# Patient Record
Sex: Male | Born: 1945 | Race: White | Hispanic: No | Marital: Married | Smoking: Former smoker
Health system: Southern US, Community
[De-identification: ages and names within clinical notes are randomized; demographics above are authoritative.]

## PROBLEM LIST (undated history)

## (undated) DIAGNOSIS — I493 Ventricular premature depolarization: Secondary | ICD-10-CM

## (undated) DIAGNOSIS — E7800 Pure hypercholesterolemia, unspecified: Secondary | ICD-10-CM

## (undated) DIAGNOSIS — I441 Atrioventricular block, second degree: Secondary | ICD-10-CM

## (undated) DIAGNOSIS — G1221 Amyotrophic lateral sclerosis: Secondary | ICD-10-CM

## (undated) DIAGNOSIS — E669 Obesity, unspecified: Secondary | ICD-10-CM

## (undated) DIAGNOSIS — R9439 Abnormal result of other cardiovascular function study: Secondary | ICD-10-CM

## (undated) DIAGNOSIS — M1990 Unspecified osteoarthritis, unspecified site: Secondary | ICD-10-CM

## (undated) DIAGNOSIS — G4733 Obstructive sleep apnea (adult) (pediatric): Secondary | ICD-10-CM

## (undated) DIAGNOSIS — H3530 Unspecified macular degeneration: Secondary | ICD-10-CM

## (undated) DIAGNOSIS — R269 Unspecified abnormalities of gait and mobility: Secondary | ICD-10-CM

## (undated) DIAGNOSIS — I2510 Atherosclerotic heart disease of native coronary artery without angina pectoris: Secondary | ICD-10-CM

## (undated) HISTORY — PX: INJECTION KNEE: SHX2446

## (undated) HISTORY — DX: Ventricular premature depolarization: I49.3

## (undated) HISTORY — PX: COLONOSCOPY: SHX174

## (undated) HISTORY — DX: Unspecified macular degeneration: H35.30

## (undated) HISTORY — DX: Pure hypercholesterolemia, unspecified: E78.00

## (undated) HISTORY — DX: Unspecified osteoarthritis, unspecified site: M19.90

## (undated) HISTORY — DX: Atrioventricular block, second degree: I44.1

## (undated) HISTORY — DX: Obstructive sleep apnea (adult) (pediatric): G47.33

## (undated) HISTORY — DX: Atherosclerotic heart disease of native coronary artery without angina pectoris: I25.10

## (undated) HISTORY — PX: HEMORRHOIDECTOMY WITH HEMORRHOID BANDING: SHX5633

## (undated) HISTORY — DX: Obesity, unspecified: E66.9

## (undated) HISTORY — DX: Abnormal result of other cardiovascular function study: R94.39

---
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
## 2006-01-26 ENCOUNTER — Encounter: Admission: RE | Admit: 2006-01-26 | Discharge: 2006-01-26 | Payer: Self-pay | Admitting: Orthopedic Surgery

## 2008-04-01 ENCOUNTER — Ambulatory Visit: Payer: Self-pay | Admitting: Unknown Physician Specialty

## 2008-05-18 ENCOUNTER — Ambulatory Visit: Payer: Self-pay | Admitting: Family Medicine

## 2009-09-05 ENCOUNTER — Ambulatory Visit: Payer: Self-pay | Admitting: Family Medicine

## 2009-11-08 ENCOUNTER — Ambulatory Visit: Payer: Self-pay | Admitting: Family Medicine

## 2011-02-26 DIAGNOSIS — I2510 Atherosclerotic heart disease of native coronary artery without angina pectoris: Secondary | ICD-10-CM

## 2011-02-26 DIAGNOSIS — R9439 Abnormal result of other cardiovascular function study: Secondary | ICD-10-CM

## 2011-02-26 HISTORY — DX: Atherosclerotic heart disease of native coronary artery without angina pectoris: I25.10

## 2011-02-26 HISTORY — DX: Abnormal result of other cardiovascular function study: R94.39

## 2011-02-26 HISTORY — PX: CORONARY ARTERY BYPASS GRAFT: SHX141

## 2011-03-05 ENCOUNTER — Encounter: Payer: Self-pay | Admitting: Cardiology

## 2011-03-05 ENCOUNTER — Ambulatory Visit (INDEPENDENT_AMBULATORY_CARE_PROVIDER_SITE_OTHER): Payer: Medicare Other | Admitting: Cardiology

## 2011-03-05 VITALS — BP 148/82 | HR 80 | Ht 73.0 in | Wt 225.0 lb

## 2011-03-05 DIAGNOSIS — R9431 Abnormal electrocardiogram [ECG] [EKG]: Secondary | ICD-10-CM

## 2011-03-05 DIAGNOSIS — I493 Ventricular premature depolarization: Secondary | ICD-10-CM

## 2011-03-05 DIAGNOSIS — I4949 Other premature depolarization: Secondary | ICD-10-CM

## 2011-03-05 DIAGNOSIS — R5383 Other fatigue: Secondary | ICD-10-CM | POA: Insufficient documentation

## 2011-03-05 DIAGNOSIS — E785 Hyperlipidemia, unspecified: Secondary | ICD-10-CM

## 2011-03-05 DIAGNOSIS — E7800 Pure hypercholesterolemia, unspecified: Secondary | ICD-10-CM

## 2011-03-05 DIAGNOSIS — R5381 Other malaise: Secondary | ICD-10-CM

## 2011-03-05 NOTE — Patient Instructions (Signed)
We will schedule you for a stress Echo   If your stress test is normal then I would focus on modifying your risk factors.  Eat a heart healthy diet ( Mediterranean)   Keep your weight down.   Exercise regularly.  Target cholesterol goal would be an LDL less than 100.

## 2011-03-05 NOTE — Progress Notes (Signed)
Jason Tate Date of Birth: 10/01/1945   History of Present Illness: [name] is seen today at the request of Dr. Sullivan Tate for evaluation of cardiac risk. He has a history of benign PVCs. He had a remote stress test 12 years ago that was normal. He does have sleep apnea and is on CPAP therapy. He denies any symptoms of chest pain or shortness of breath but does complain of fatigue. Chest x-ray apparently demonstrated atherosclerotic disease of the aorta. He is concerned about his cardiovascular risk and wanted to be evaluated. He denies a history of hypertension. His cholesterol levels have been elevated but the results are unknown. He has no history of tobacco use or diabetes. Current Outpatient Prescriptions on File Prior to Visit  Medication Sig Dispense Refill  . allopurinol (ZYLOPRIM) 300 MG tablet Take 300 mg by mouth daily.        Marland Kitchen aspirin 81 MG tablet Take 81 mg by mouth 2 (two) times daily.        Marland Kitchen buPROPion (WELLBUTRIN XL) 300 MG 24 hr tablet Take 300 mg by mouth daily.        . citalopram (CELEXA) 40 MG tablet Take 40 mg by mouth daily.        . nabumetone (RELAFEN) 750 MG tablet Take 750 mg by mouth 2 (two) times daily.        . Omega-3 Fatty Acids (FISH OIL PO) Take by mouth.        Marland Kitchen omeprazole (PRILOSEC) 20 MG capsule Take 20 mg by mouth daily.          No Known Allergies  Past Medical History  Diagnosis Date  . Sleep apnea, obstructive     on CPAP  . Hypercholesterolemia   . Low testosterone   . PVC (premature ventricular contraction)   . Macular degeneration   . Obesity     History reviewed. No pertinent past surgical history.  History  Smoking status  . Former Smoker  Smokeless tobacco  . Not on file    History  Alcohol Use  . Yes    Family History  Problem Relation Age of Onset  . Heart disease Mother   . Stroke Father   . Heart disease Father     Review of Systems: The review of systems is positive for low testosterone.  He is on  AndroGel therapy. He uses CPAP therapy. He exercises at the [facility] 3 days a week. All other systems were reviewed and are negative.  Physical Exam: BP 148/82  Pulse 80  Ht 6\' 1"  (1.854 m)  Wt 225 lb (102.059 kg)  BMI 29.69 kg/m2 He is an overweight white male in no acute distress.The patient is alert and oriented x 3.  The mood and affect are normal.  The skin is warm and dry.  Color is normal.  The HEENT exam reveals that the sclera are nonicteric.  The mucous membranes are moist.  The carotids are 2+ without bruits.  There is no thyromegaly.  There is no JVD.  The lungs are clear.  The chest wall is non tender.  The heart exam reveals a regular rate with a normal S1 and S2.  There are no murmurs, gallops, or rubs.  The PMI is not displaced.   Abdominal exam reveals good bowel sounds.  There is no guarding or rebound.  There is no hepatosplenomegaly or tenderness.  There are no masses.  Exam of the legs reveal no clubbing, cyanosis, or  edema.  The legs are without rashes.  The distal pulses are intact.  Cranial nerves II - XII are intact.  Motor and sensory functions are intact.  The gait is normal.   LABORATORY DATA: ECG today demonstrates normal sinus rhythm with QS complex in leads V1 through V3. Cannot rule out anterior infarct.  Assessment / Plan:

## 2011-03-05 NOTE — Assessment & Plan Note (Signed)
Cannot rule out prior septal infarct. We will schedule him for a stress echo to further evaluate. If his stress test is normal then I would recommend continued risk factor modification.

## 2011-03-05 NOTE — Assessment & Plan Note (Signed)
His lipid levels are unknown. I've recommended a target LDL of 100. He is going to work on his risk factor modification with dietary and exercise changes. If he is not able to achieve this goal statin therapy would be a consideration.

## 2011-03-19 ENCOUNTER — Ambulatory Visit (HOSPITAL_COMMUNITY): Payer: Medicare Other | Attending: Cardiology | Admitting: Radiology

## 2011-03-19 ENCOUNTER — Ambulatory Visit (HOSPITAL_BASED_OUTPATIENT_CLINIC_OR_DEPARTMENT_OTHER): Payer: Medicare Other | Admitting: Radiology

## 2011-03-19 DIAGNOSIS — E785 Hyperlipidemia, unspecified: Secondary | ICD-10-CM | POA: Insufficient documentation

## 2011-03-19 DIAGNOSIS — I4949 Other premature depolarization: Secondary | ICD-10-CM | POA: Insufficient documentation

## 2011-03-19 DIAGNOSIS — R9431 Abnormal electrocardiogram [ECG] [EKG]: Secondary | ICD-10-CM

## 2011-03-19 DIAGNOSIS — R5383 Other fatigue: Secondary | ICD-10-CM

## 2011-03-19 DIAGNOSIS — R0989 Other specified symptoms and signs involving the circulatory and respiratory systems: Secondary | ICD-10-CM

## 2011-03-19 DIAGNOSIS — R5381 Other malaise: Secondary | ICD-10-CM | POA: Insufficient documentation

## 2011-03-20 ENCOUNTER — Ambulatory Visit
Admission: RE | Admit: 2011-03-20 | Discharge: 2011-03-20 | Disposition: A | Discharge status: Home / Self Care | Payer: Medicare Other | Source: Ambulatory Visit | Attending: Nurse Practitioner | Admitting: Nurse Practitioner

## 2011-03-20 ENCOUNTER — Telehealth: Payer: Self-pay | Admitting: *Deleted

## 2011-03-20 ENCOUNTER — Ambulatory Visit (INDEPENDENT_AMBULATORY_CARE_PROVIDER_SITE_OTHER): Payer: Medicare Other | Admitting: Nurse Practitioner

## 2011-03-20 ENCOUNTER — Encounter: Payer: Self-pay | Admitting: Nurse Practitioner

## 2011-03-20 VITALS — BP 170/110 | HR 84 | Ht 73.0 in | Wt 222.1 lb

## 2011-03-20 DIAGNOSIS — R0789 Other chest pain: Secondary | ICD-10-CM

## 2011-03-20 DIAGNOSIS — R079 Chest pain, unspecified: Secondary | ICD-10-CM

## 2011-03-20 DIAGNOSIS — R9439 Abnormal result of other cardiovascular function study: Secondary | ICD-10-CM

## 2011-03-20 LAB — CBC WITH DIFFERENTIAL/PLATELET
Basophils Absolute: 0 10*3/uL (ref 0.0–0.1)
Basophils Relative: 0.5 % (ref 0.0–3.0)
Eosinophils Absolute: 0.2 10*3/uL (ref 0.0–0.7)
Eosinophils Relative: 3.3 % (ref 0.0–5.0)
HCT: 37.8 % — ABNORMAL LOW (ref 39.0–52.0)
Hemoglobin: 13.3 g/dL (ref 13.0–17.0)
Lymphocytes Relative: 23.2 % (ref 12.0–46.0)
Lymphs Abs: 1.4 10*3/uL (ref 0.7–4.0)
MCHC: 35.1 g/dL (ref 30.0–36.0)
MCV: 100.9 fl — ABNORMAL HIGH (ref 78.0–100.0)
Monocytes Absolute: 0.5 10*3/uL (ref 0.1–1.0)
Monocytes Relative: 8.2 % (ref 3.0–12.0)
Neutro Abs: 4 10*3/uL (ref 1.4–7.7)
Neutrophils Relative %: 64.8 % (ref 43.0–77.0)
Platelets: 184 10*3/uL (ref 150.0–400.0)
RBC: 3.75 Mil/uL — ABNORMAL LOW (ref 4.22–5.81)
RDW: 13.7 % (ref 11.5–14.6)
WBC: 6.2 10*3/uL (ref 4.5–10.5)

## 2011-03-20 LAB — BASIC METABOLIC PANEL
BUN: 17 mg/dL (ref 6–23)
CO2: 24 mEq/L (ref 19–32)
Calcium: 9.2 mg/dL (ref 8.4–10.5)
Chloride: 105 mEq/L (ref 96–112)
Creatinine, Ser: 1.2 mg/dL (ref 0.4–1.5)
GFR: 63.6 mL/min (ref >60.00)
Glucose, Bld: 113 mg/dL — ABNORMAL HIGH (ref 70–99)
Potassium: 3.9 mEq/L (ref 3.5–5.1)
Sodium: 140 mEq/L (ref 135–145)

## 2011-03-20 LAB — HEPATIC FUNCTION PANEL
ALT: 26 U/L (ref 0–53)
AST: 25 U/L (ref 0–37)
Albumin: 4.4 g/dL (ref 3.5–5.2)
Alkaline Phosphatase: 67 U/L (ref 39–117)
Bilirubin, Direct: 0 mg/dL (ref 0.0–0.3)
Total Bilirubin: 0.8 mg/dL (ref 0.3–1.2)
Total Protein: 7.4 g/dL (ref 6.0–8.3)

## 2011-03-20 LAB — LIPID PANEL
Cholesterol: 185 mg/dL (ref 0–200)
HDL: 51.5 mg/dL (ref >39.00)
LDL Cholesterol: 96 mg/dL (ref 0–99)
Total CHOL/HDL Ratio: 4
Triglycerides: 189 mg/dL — ABNORMAL HIGH (ref 0.0–149.0)
VLDL: 37.8 mg/dL (ref 0.0–40.0)

## 2011-03-20 LAB — APTT: aPTT: 26.5 s (ref 21.7–28.8)

## 2011-03-20 LAB — PROTIME-INR
INR: 1 ratio (ref 0.8–1.0)
Prothrombin Time: 10.9 s (ref 10.2–12.4)

## 2011-03-20 NOTE — Telephone Encounter (Signed)
Notified of abn stress echo. Will come in today to see Lawson Fiscal to discuss heart cath; Dr. Swaziland will also talk to him

## 2011-03-20 NOTE — Assessment & Plan Note (Signed)
We will proceed on with cardiac catheterization. The risks, benefits and procedure was discussed and he is willing to proceed. Will plan for cath on Thursday. CXR today. I will see him back for his post hospital in 2 to 3 weeks. He is to limit his activities for now. No change in his medicines. Will need to reassess blood pressure but he says it is ok at home. He is anxious today. Patient is agreeable to this plan and will call if any problems develop in the interim.

## 2011-03-20 NOTE — Telephone Encounter (Signed)
Message copied by Lorayne Bender on Tue Mar 20, 2011  9:04 AM ------      Message from: Swaziland, PETER M      Created: Mon Mar 19, 2011  9:31 PM       Stress Echo is significantly abnormal. We need to set him up for a cardiac cath. I or Lawson Fiscal will need to see to discuss.      Theron Arista Swaziland

## 2011-03-20 NOTE — Progress Notes (Signed)
Jason Tate Date of Birth: Jun 10, 1945 Medical Record #[id number]  History of Present Illness: Jason Tate is seen today for Dr. Swaziland. He has had a stress echo yesterday for risk stratification. He has multiple CV risk factors. No chest pain. His study was abnormal with a baseline EF of 50 to 55% the at 35 to 40% with peak exercise. There was evidence of infarct in the distal inferior wall; ischemia in the posterior, distal and anterior and apical walls as well. He does not have chest pain. He has had fatigue and no energy. He is not short of breath.   Current Outpatient Prescriptions on File Prior to Visit  Medication Sig Dispense Refill  . allopurinol (ZYLOPRIM) 300 MG tablet Take 300 mg by mouth daily.        Marland Kitchen aspirin 81 MG tablet Take 162 mg by mouth 2 (two) times daily.       . B Complex Vitamins (VITAMIN B COMPLEX PO) Take by mouth.        Marland Kitchen buPROPion (WELLBUTRIN XL) 300 MG 24 hr tablet Take 300 mg by mouth daily.        . Cholecalciferol (VITAMIN D PO) Take 4,000 mg by mouth daily.        . citalopram (CELEXA) 40 MG tablet Take 40 mg by mouth daily.        . Misc Natural Products (OSTEO BI-FLEX JOINT SHIELD PO) Take 1,500 mg by mouth 2 (two) times daily.        . Multiple Vitamins-Minerals (PRESERVISION/LUTEIN PO) Take by mouth 3 (three) times daily.        . nabumetone (RELAFEN) 750 MG tablet Take 750 mg by mouth 2 (two) times daily.        . Omega-3 Fatty Acids (FISH OIL PO) Take by mouth.        Marland Kitchen omeprazole (PRILOSEC) 20 MG capsule Take 20 mg by mouth daily.        . Testosterone (ANDROGEL PUMP TD) Place onto the skin daily.          No Known Allergies  Past Medical History  Diagnosis Date  . Sleep apnea, obstructive     on CPAP  . Hypercholesterolemia   . Low testosterone   . PVC (premature ventricular contraction)   . Macular degeneration   . Obesity   . Abnormal stress echo Oct 2012    History reviewed. No pertinent past surgical history.  History    Smoking status  . Former Smoker  Smokeless tobacco  . Not on file    History  Alcohol Use  . Yes    Family History  Problem Relation Age of Onset  . Heart disease Mother   . Stroke Father   . Heart disease Father     Review of Systems: The review of systems is per the HPI.  Blood pressure at home is usually ok. All other systems were reviewed and are negative.  Physical Exam: BP 170/110  Pulse 84  Ht 6\' 1"  (1.854 m)  Wt 222 lb 1.9 oz (100.753 kg)  BMI 29.31 kg/m2 Patient is very pleasant and in no acute distress. He is anxious today.  Skin is warm and dry. Color is normal.  HEENT is unremarkable. Normocephalic/atraumatic. PERRL. Sclera are nonicteric. Neck is supple. No masses. No JVD. Lungs are clear. Cardiac exam shows a regular rate and rhythm. Abdomen is soft. Extremities are without edema. Gait and ROM are intact. No gross neurologic deficits noted.  LABORATORY DATA:   Assessment / Plan:

## 2011-03-20 NOTE — Patient Instructions (Signed)
We are going to arrange for a heart catheterization with Dr. Swaziland.  Please go to [facility] for a CXR today. Go to [facility]   We will get your labs today.  You are scheduled for a cardiac catheterization on Thursday, October 25th at 2:30pm with Dr. Swaziland.  Go to [facility] 2nd Floor Short Stay on Thursday  at 12:30pm. No food or drink after 8am on Thursday. May have clear liquids before 8am. No solids. You may take your medications with a sip of water on the day of your procedure.

## 2011-03-21 ENCOUNTER — Telehealth: Payer: Self-pay | Admitting: *Deleted

## 2011-03-21 NOTE — Telephone Encounter (Signed)
Notified of lab results and cxr results. Ok for cath tomorrow

## 2011-03-21 NOTE — Telephone Encounter (Signed)
Message copied by Lorayne Bender on Wed Mar 21, 2011 11:13 AM ------      Message from: Rosalio Macadamia      Created: Tue Mar 20, 2011  4:39 PM       Ok to report. Labs are satisfactory for cath on Thursday.

## 2011-03-22 ENCOUNTER — Inpatient Hospital Stay (HOSPITAL_COMMUNITY)
Admission: RE | Admit: 2011-03-22 | Discharge: 2011-03-27 | DRG: 234 | Disposition: A | Discharge status: Home / Self Care | Payer: Medicare Other | Source: Ambulatory Visit | Attending: Thoracic Surgery (Cardiothoracic Vascular Surgery) | Admitting: Thoracic Surgery (Cardiothoracic Vascular Surgery)

## 2011-03-22 ENCOUNTER — Ambulatory Visit (HOSPITAL_COMMUNITY): Payer: Medicare Other

## 2011-03-22 DIAGNOSIS — Z87891 Personal history of nicotine dependence: Secondary | ICD-10-CM

## 2011-03-22 DIAGNOSIS — I2510 Atherosclerotic heart disease of native coronary artery without angina pectoris: Principal | ICD-10-CM | POA: Diagnosis present

## 2011-03-22 DIAGNOSIS — Z01810 Encounter for preprocedural cardiovascular examination: Secondary | ICD-10-CM

## 2011-03-22 DIAGNOSIS — H3530 Unspecified macular degeneration: Secondary | ICD-10-CM | POA: Diagnosis present

## 2011-03-22 DIAGNOSIS — G4733 Obstructive sleep apnea (adult) (pediatric): Secondary | ICD-10-CM | POA: Diagnosis present

## 2011-03-22 DIAGNOSIS — M109 Gout, unspecified: Secondary | ICD-10-CM | POA: Diagnosis present

## 2011-03-22 DIAGNOSIS — D62 Acute posthemorrhagic anemia: Secondary | ICD-10-CM | POA: Diagnosis not present

## 2011-03-22 DIAGNOSIS — E669 Obesity, unspecified: Secondary | ICD-10-CM | POA: Diagnosis present

## 2011-03-22 DIAGNOSIS — Z7982 Long term (current) use of aspirin: Secondary | ICD-10-CM

## 2011-03-22 LAB — URINALYSIS, DIPSTICK ONLY
Bilirubin Urine: NEGATIVE
Glucose, UA: NEGATIVE mg/dL
Hgb urine dipstick: NEGATIVE
Ketones, ur: NEGATIVE mg/dL
Leukocytes, UA: NEGATIVE
Nitrite: NEGATIVE
Protein, ur: NEGATIVE mg/dL
Specific Gravity, Urine: 1.031 — ABNORMAL HIGH (ref 1.005–1.030)
Urobilinogen, UA: 0.2 mg/dL (ref 0.0–1.0)
pH: 5.5 (ref 5.0–8.0)

## 2011-03-22 LAB — ABO/RH: ABO/RH(D): B NEG

## 2011-03-22 LAB — PULMONARY FUNCTION TEST

## 2011-03-22 LAB — TYPE AND SCREEN
ABO/RH(D): B NEG
Antibody Screen: NEGATIVE

## 2011-03-22 LAB — SURGICAL PCR SCREEN
MRSA, PCR: NEGATIVE
Staphylococcus aureus: NEGATIVE

## 2011-03-23 ENCOUNTER — Inpatient Hospital Stay (HOSPITAL_COMMUNITY): Payer: Medicare Other

## 2011-03-23 DIAGNOSIS — I2510 Atherosclerotic heart disease of native coronary artery without angina pectoris: Secondary | ICD-10-CM

## 2011-03-23 LAB — PROTIME-INR
INR: 1.26 (ref 0.00–1.49)
Prothrombin Time: 16.1 seconds — ABNORMAL HIGH (ref 11.6–15.2)

## 2011-03-23 LAB — CBC
HCT: 34.1 % — ABNORMAL LOW (ref 39.0–52.0)
HCT: 26.7 % — ABNORMAL LOW (ref 39.0–52.0)
HCT: 20.3 % — ABNORMAL LOW (ref 39.0–52.0)
Hemoglobin: 11.8 g/dL — ABNORMAL LOW (ref 13.0–17.0)
Hemoglobin: 9.5 g/dL — ABNORMAL LOW (ref 13.0–17.0)
Hemoglobin: 7.2 g/dL — ABNORMAL LOW (ref 13.0–17.0)
MCH: 34 pg (ref 26.0–34.0)
MCH: 34.4 pg — ABNORMAL HIGH (ref 26.0–34.0)
MCH: 34.8 pg — ABNORMAL HIGH (ref 26.0–34.0)
MCHC: 34.6 g/dL (ref 30.0–36.0)
MCHC: 35.6 g/dL (ref 30.0–36.0)
MCHC: 35.5 g/dL (ref 30.0–36.0)
MCV: 98.3 fL (ref 78.0–100.0)
MCV: 96.7 fL (ref 78.0–100.0)
MCV: 98.1 fL (ref 78.0–100.0)
Platelets: 166 10*3/uL (ref 150–400)
Platelets: 90 10*3/uL — ABNORMAL LOW (ref 150–400)
Platelets: 118 10*3/uL — ABNORMAL LOW (ref 150–400)
RBC: 3.47 MIL/uL — ABNORMAL LOW (ref 4.22–5.81)
RBC: 2.76 MIL/uL — ABNORMAL LOW (ref 4.22–5.81)
RBC: 2.07 MIL/uL — ABNORMAL LOW (ref 4.22–5.81)
RDW: 13.8 % (ref 11.5–15.5)
RDW: 13.6 % (ref 11.5–15.5)
RDW: 13.4 % (ref 11.5–15.5)
WBC: 7.7 10*3/uL (ref 4.0–10.5)
WBC: 8.9 10*3/uL (ref 4.0–10.5)
WBC: 8.8 10*3/uL (ref 4.0–10.5)

## 2011-03-23 LAB — POCT I-STAT, CHEM 8
BUN: 10 mg/dL (ref 6–23)
Calcium, Ion: 0.95 mmol/L — ABNORMAL LOW (ref 1.12–1.32)
Chloride: 112 mEq/L (ref 96–112)
Creatinine, Ser: 0.7 mg/dL (ref 0.50–1.35)
Glucose, Bld: 77 mg/dL (ref 70–99)
HCT: 17 % — ABNORMAL LOW (ref 39.0–52.0)
Hemoglobin: 5.8 g/dL — CL (ref 13.0–17.0)
Potassium: 3 mEq/L — ABNORMAL LOW (ref 3.5–5.1)
Sodium: 145 mEq/L (ref 135–145)
TCO2: 16 mmol/L (ref 0–100)

## 2011-03-23 LAB — BASIC METABOLIC PANEL
BUN: 21 mg/dL (ref 6–23)
CO2: 23 mEq/L (ref 19–32)
Calcium: 9.2 mg/dL (ref 8.4–10.5)
Chloride: 103 mEq/L (ref 96–112)
Creatinine, Ser: 1.04 mg/dL (ref 0.50–1.35)
GFR calc Af Amer: 84 mL/min — ABNORMAL LOW (ref >90)
GFR calc non Af Amer: 73 mL/min — ABNORMAL LOW (ref >90)
Glucose, Bld: 94 mg/dL (ref 70–99)
Potassium: 3.6 mEq/L (ref 3.5–5.1)
Sodium: 138 mEq/L (ref 135–145)

## 2011-03-23 LAB — BLOOD GAS, ARTERIAL
Acid-base deficit: 1.8 mmol/L (ref 0.0–2.0)
Bicarbonate: 22.4 mEq/L (ref 20.0–24.0)
Drawn by: 331761
FIO2: 0.21 %
O2 Saturation: 95.6 %
Patient temperature: 98.6
TCO2: 23.5 mmol/L (ref 0–100)
pCO2 arterial: 37.7 mmHg (ref 35.0–45.0)
pH, Arterial: 7.392 (ref 7.350–7.450)
pO2, Arterial: 76.5 mmHg — ABNORMAL LOW (ref 80.0–100.0)

## 2011-03-23 LAB — HEMOGLOBIN A1C
Hgb A1c MFr Bld: 5.2 % (ref <5.7)
Mean Plasma Glucose: 103 mg/dL (ref <117)

## 2011-03-23 LAB — PLATELET COUNT: Platelets: 122 10*3/uL — ABNORMAL LOW (ref 150–400)

## 2011-03-23 LAB — POCT I-STAT 3, ART BLOOD GAS (G3+)
Acid-base deficit: 2 mmol/L (ref 0.0–2.0)
Acid-base deficit: 1 mmol/L (ref 0.0–2.0)
Bicarbonate: 27.1 mEq/L — ABNORMAL HIGH (ref 20.0–24.0)
Bicarbonate: 25.6 mEq/L — ABNORMAL HIGH (ref 20.0–24.0)
Bicarbonate: 23 mEq/L (ref 20.0–24.0)
Bicarbonate: 22.3 mEq/L (ref 20.0–24.0)
O2 Saturation: 100 %
O2 Saturation: 100 %
O2 Saturation: 95 %
O2 Saturation: 99 %
Patient temperature: 34.6
Patient temperature: 36.6
TCO2: 29 mmol/L (ref 0–100)
TCO2: 27 mmol/L (ref 0–100)
TCO2: 24 mmol/L (ref 0–100)
TCO2: 23 mmol/L (ref 0–100)
pCO2 arterial: 53.9 mmHg — ABNORMAL HIGH (ref 35.0–45.0)
pCO2 arterial: 45.7 mmHg — ABNORMAL HIGH (ref 35.0–45.0)
pCO2 arterial: 35.9 mmHg (ref 35.0–45.0)
pCO2 arterial: 30.7 mmHg — ABNORMAL LOW (ref 35.0–45.0)
pH, Arterial: 7.309 — ABNORMAL LOW (ref 7.350–7.450)
pH, Arterial: 7.355 (ref 7.350–7.450)
pH, Arterial: 7.404 (ref 7.350–7.450)
pH, Arterial: 7.468 — ABNORMAL HIGH (ref 7.350–7.450)
pO2, Arterial: 432 mmHg — ABNORMAL HIGH (ref 80.0–100.0)
pO2, Arterial: 344 mmHg — ABNORMAL HIGH (ref 80.0–100.0)
pO2, Arterial: 67 mmHg — ABNORMAL LOW (ref 80.0–100.0)
pO2, Arterial: 122 mmHg — ABNORMAL HIGH (ref 80.0–100.0)

## 2011-03-23 LAB — POCT I-STAT 4, (NA,K, GLUC, HGB,HCT)
Glucose, Bld: 103 mg/dL — ABNORMAL HIGH (ref 70–99)
Glucose, Bld: 105 mg/dL — ABNORMAL HIGH (ref 70–99)
Glucose, Bld: 91 mg/dL (ref 70–99)
Glucose, Bld: 98 mg/dL (ref 70–99)
Glucose, Bld: 127 mg/dL — ABNORMAL HIGH (ref 70–99)
Glucose, Bld: 121 mg/dL — ABNORMAL HIGH (ref 70–99)
HCT: 30 % — ABNORMAL LOW (ref 39.0–52.0)
HCT: 28 % — ABNORMAL LOW (ref 39.0–52.0)
HCT: 25 % — ABNORMAL LOW (ref 39.0–52.0)
HCT: 25 % — ABNORMAL LOW (ref 39.0–52.0)
HCT: 24 % — ABNORMAL LOW (ref 39.0–52.0)
HCT: 28 % — ABNORMAL LOW (ref 39.0–52.0)
Hemoglobin: 10.2 g/dL — ABNORMAL LOW (ref 13.0–17.0)
Hemoglobin: 9.5 g/dL — ABNORMAL LOW (ref 13.0–17.0)
Hemoglobin: 8.5 g/dL — ABNORMAL LOW (ref 13.0–17.0)
Hemoglobin: 8.5 g/dL — ABNORMAL LOW (ref 13.0–17.0)
Hemoglobin: 8.2 g/dL — ABNORMAL LOW (ref 13.0–17.0)
Hemoglobin: 9.5 g/dL — ABNORMAL LOW (ref 13.0–17.0)
Potassium: 3.8 mEq/L (ref 3.5–5.1)
Potassium: 4 mEq/L (ref 3.5–5.1)
Potassium: 4.3 mEq/L (ref 3.5–5.1)
Potassium: 5 mEq/L (ref 3.5–5.1)
Potassium: 4.3 mEq/L (ref 3.5–5.1)
Potassium: 4.2 mEq/L (ref 3.5–5.1)
Sodium: 139 mEq/L (ref 135–145)
Sodium: 138 mEq/L (ref 135–145)
Sodium: 135 mEq/L (ref 135–145)
Sodium: 136 mEq/L (ref 135–145)
Sodium: 136 mEq/L (ref 135–145)
Sodium: 135 mEq/L (ref 135–145)

## 2011-03-23 LAB — GLUCOSE, CAPILLARY
Glucose-Capillary: 129 mg/dL — ABNORMAL HIGH (ref 70–99)
Glucose-Capillary: 144 mg/dL — ABNORMAL HIGH (ref 70–99)
Glucose-Capillary: 105 mg/dL — ABNORMAL HIGH (ref 70–99)
Glucose-Capillary: 111 mg/dL — ABNORMAL HIGH (ref 70–99)
Glucose-Capillary: 124 mg/dL — ABNORMAL HIGH (ref 70–99)
Glucose-Capillary: 125 mg/dL — ABNORMAL HIGH (ref 70–99)
Glucose-Capillary: 118 mg/dL — ABNORMAL HIGH (ref 70–99)
Glucose-Capillary: 97 mg/dL (ref 70–99)
Glucose-Capillary: 98 mg/dL (ref 70–99)

## 2011-03-23 LAB — HEPARIN LEVEL (UNFRACTIONATED)
Heparin Unfractionated: 0.16 IU/mL — ABNORMAL LOW (ref 0.30–0.70)
Heparin Unfractionated: <0.1 IU/mL — ABNORMAL LOW (ref 0.30–0.70)

## 2011-03-23 LAB — MAGNESIUM: Magnesium: 1.9 mg/dL (ref 1.5–2.5)

## 2011-03-23 LAB — CREATININE, SERUM
Creatinine, Ser: 0.63 mg/dL (ref 0.50–1.35)
GFR calc Af Amer: >90 mL/min (ref >90)
GFR calc non Af Amer: >90 mL/min (ref >90)

## 2011-03-23 LAB — HEMOGLOBIN AND HEMATOCRIT, BLOOD
HCT: 24.9 % — ABNORMAL LOW (ref 39.0–52.0)
Hemoglobin: 8.7 g/dL — ABNORMAL LOW (ref 13.0–17.0)

## 2011-03-23 LAB — APTT: aPTT: 35 seconds (ref 24–37)

## 2011-03-23 NOTE — Consult Note (Signed)
NAMEAUSTINE, Jason Tate NO.:  [id number]  MEDICAL RECORD NO.:  [id number]  LOCATION:  2502                         FACILITY:  [facility]  PHYSICIAN:  Salvatore Decent. Cornelius Moras, M.D. DATE OF BIRTH:  March 11, 1946  DATE OF CONSULTATION:  03/22/2011 DATE OF DISCHARGE:                                CONSULTATION   REQUESTING PHYSICIAN:  Peter M. Swaziland, MD  PRIMARY CARE PHYSICIAN:  Julieanne Manson, MD  REASON FOR CONSULTATION:  Left main disease.  HISTORY OF PRESENT ILLNESS:  The patient is a 65 year old gentleman from [place] with no previous history of coronary artery disease and essentially no risk factors other than the fact that the patient's father did have coronary artery disease.  The patient reports that he has had some decreased energy for several months and perhaps a year or so.  He also has occasional episodes of shortness of breath with strenuous physical activity.  He was referred for a stress echocardiogram which was abnormal at baseline with ejection fraction of 50-55%.  With peak exercise, ejection fraction dropped to 67-40%.  The patient reports that his symptoms of shortness of breath were reproduced during stress during the procedure.  The patient was subsequently brought in for elective cardiac catheterization today.  The patient was found to have left main disease with mild left ventricular dysfunction. Cardiothoracic surgical consultation was requested.  REVIEW OF SYSTEMS:  GENERAL:  The patient reports normal appetite.  He has not been gaining or losing weight recently.  He has had decreased energy.  CARDIAC:  The patient specifically denies any history of chest pain, chest tightness, chest pressure either with activity or at rest. The patient does have mild exertional shortness of breath.  It only occurs with strenuous activity but he states it is noticeable and it has been going on for quite some time.  He denies resting shortness of breath, PND,  orthopnea, or lower extremity edema.  He has not had palpitations or syncope.  RESPIRATORY:  Negative.  The patient denies productive cough, hemoptysis, wheezing.  GASTROINTESTINAL:  Negative. The patient has no difficulty swallowing.  He reports normal bowel function.  He had both upper GI and colonoscopy performed within the past year and they were unremarkable.  MUSCULOSKELETAL:  Notable for mild arthritis and arthralgias afflicting his knee.  This does not seem to slow him down much.  NEUROLOGIC:  Negative.  The patient denies symptoms suggestive of TIA or stroke.  GENITOURINARY:  Negative.  HEENT: Negative.  INFECTIOUS:  Negative.  PAST MEDICAL HISTORY: 1. Obstructive sleep apnea. 2. Hypercholesterolemia. 3. Low testosterone. 4. Macular degeneration. 5. Gout.  PAST SURGICAL HISTORY:  Traumatic amputation of right toe.  FAMILY HISTORY:  Notable in that the patient's father and mother both had ischemic heart disease but not at young age.  SOCIAL HISTORY:  The patient is retired and lives here locally in [place] with his wife.  He is a former cigarette smoker but quit smoking in 1981.  He does occasionally smoke a cigar.  He does not use much alcohol, but he does use some.  MEDICATIONS PRIOR TO ADMISSION:  Allopurinol, aspirin, vitamin B complex, Wellbutrin, vitamin D, Celexa, multivitamin, Relafen, Osteo Bi-  Flex, omega-3 fatty acid, Prilosec, AndroGel.  DRUG ALLERGIES:  None known.  PHYSICAL EXAMINATION:  GENERAL:  The patient is a well-appearing male, appears his stated age, in no acute distress.  He is moderately obese with a body mass index of 29.3. HEENT:  Unrevealing. NECK:  Supple.  There is no cervical nor supraclavicular lymphadenopathy.  There is no jugular venous distention.  There are no carotid bruits. CHEST:  Auscultation of the chest reveals clear breath sounds which are symmetrical bilaterally.  No wheezes, rales, or rhonchi are noted. CARDIOVASCULAR:   Notable for regular rate and rhythm.  No murmurs, rubs, or gallops are appreciated. ABDOMEN:  Soft, nondistended, nontender.  Bowel sounds are present. EXTREMITIES:  Warm and well perfused.  There is no lower extremity edema.  Distal pulses are easily palpable in the posterior tibial position bilaterally.  Examination of the left hand is notable for intact palmar arch circulation based upon Allen test which is normal. NEUROLOGIC:  Grossly nonfocal and symmetrical throughout. RECTAL AND GU:  Both deferred. Remainder of his physical exam is noncontributory.  DIAGNOSTIC TESTS:  Cardiac catheterization performed by Dr. Swaziland earlier today is reviewed.  This demonstrates 80% distal left main coronary artery stenosis, which extends into the ostium of both the left anterior descending coronary artery and the left circumflex.  There is 90-95% ostial stenosis of the left circumflex coronary artery and 90-95% proximal stenosis of the left anterior descending coronary artery.  The left anterior descending coronary artery is diffusely disease with heavy plaque.  There is right dominant coronary circulation.  There were luminal irregularities throughout the right coronary artery with perhaps 30% stenosis at most.  Left ventricular function is mildly reduced with ejection fraction estimated 50%.  There is anteroapical hypokinesis.  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  The patient is remarkably free of any symptoms, but he has angiographically quite severe disease and a positive stress test.  I agree that he needs surgical revascularization.  PLAN:  I have discussed options at length with Jason Tate and his wife. Alternative treatment strategies have been discussed.  Overall risks associated with surgery should be quite small.  They understand and accept all potential associated risks of surgery including but  not limited to risk of death, stroke, myocardial infarction, congestive heart failure, respiratory failure, renal failure, pneumonia, bleeding requiring blood transfusion, arrhythmia, infection, and late recurrence of coronary artery disease.  All the questions have been addressed.  We plan to proceed with surgery in the morning.     Salvatore Decent. Cornelius Moras, M.D.     CHO/MEDQ  D:  03/22/2011  T:  03/23/2011  Job:  [id number]  cc:   Peter M. Swaziland, M.D. Julieanne Manson, MD  Electronically Signed by Tressie Stalker M.D. on 03/23/2011 07:37:01 AM

## 2011-03-24 ENCOUNTER — Inpatient Hospital Stay (HOSPITAL_COMMUNITY): Payer: Medicare Other

## 2011-03-24 LAB — CBC
HCT: 25.7 % — ABNORMAL LOW (ref 39.0–52.0)
Hemoglobin: 8.9 g/dL — ABNORMAL LOW (ref 13.0–17.0)
MCH: 34 pg (ref 26.0–34.0)
MCHC: 34.6 g/dL (ref 30.0–36.0)
MCV: 98.1 fL (ref 78.0–100.0)
Platelets: 137 10*3/uL — ABNORMAL LOW (ref 150–400)
RBC: 2.62 MIL/uL — ABNORMAL LOW (ref 4.22–5.81)
RDW: 13.7 % (ref 11.5–15.5)
WBC: 8.4 10*3/uL (ref 4.0–10.5)

## 2011-03-24 LAB — BASIC METABOLIC PANEL
BUN: 15 mg/dL (ref 6–23)
CO2: 23 mEq/L (ref 19–32)
Calcium: 8.2 mg/dL — ABNORMAL LOW (ref 8.4–10.5)
Chloride: 105 mEq/L (ref 96–112)
Creatinine, Ser: 0.88 mg/dL (ref 0.50–1.35)
GFR calc Af Amer: >90 mL/min (ref >90)
GFR calc non Af Amer: 88 mL/min — ABNORMAL LOW (ref >90)
Glucose, Bld: 116 mg/dL — ABNORMAL HIGH (ref 70–99)
Potassium: 4.1 mEq/L (ref 3.5–5.1)
Sodium: 136 mEq/L (ref 135–145)

## 2011-03-24 LAB — GLUCOSE, CAPILLARY
Glucose-Capillary: 101 mg/dL — ABNORMAL HIGH (ref 70–99)
Glucose-Capillary: 113 mg/dL — ABNORMAL HIGH (ref 70–99)
Glucose-Capillary: 114 mg/dL — ABNORMAL HIGH (ref 70–99)
Glucose-Capillary: 87 mg/dL (ref 70–99)

## 2011-03-24 LAB — PREPARE PLATELET PHERESIS
Unit division: 0
Unit division: 0

## 2011-03-24 LAB — CREATININE, SERUM
Creatinine, Ser: 1.15 mg/dL (ref 0.50–1.35)
GFR calc Af Amer: 75 mL/min — ABNORMAL LOW (ref >90)
GFR calc non Af Amer: 65 mL/min — ABNORMAL LOW (ref >90)

## 2011-03-24 LAB — MAGNESIUM
Magnesium: 2.3 mg/dL (ref 1.5–2.5)
Magnesium: 2.2 mg/dL (ref 1.5–2.5)

## 2011-03-24 NOTE — Cardiovascular Report (Signed)
  NAMEARTHER, HEISLER NO.:  [id number]  MEDICAL RECORD NO.:  [id number]  LOCATION:  2502                         FACILITY:  [facility]  PHYSICIAN:  Peter M. Swaziland, M.D.  DATE OF BIRTH:  04/27/1946  DATE OF PROCEDURE:  03/22/2011 DATE OF DISCHARGE:                           CARDIAC CATHETERIZATION   INDICATION FOR PROCEDURE:  The patient is a 65 year old white male with multiple cardiac risk factors who has had recent symptoms of fatigue and dyspnea on exertion.  A stress echo evaluation was markedly abnormal with a baseline ejection fraction of 50%, which declined to 35-40% with peak exercise.  He had ischemia in the posterior, distal anterior, and apical walls.  PROCEDURE:  Today is left heart catheterization, coronary and left ventricular angiography.  ACCESS:  Via the right radial artery using the standard Seldinger technique.  EQUIPMENTS:  5-French 4-cm right Judkins catheter, 5-French 3.5-cm left Judkins catheter, 5-French pigtail catheter, 5-French arterial sheath.  MEDICATIONS:  Local anesthesia 1% Xylocaine, Versed 1 mg IV, fentanyl 25 mcg IV, heparin bolus of 3800 units IV, verapamil 3 mg intraarterial.  CONTRAST:  100 mL of Omnipaque.  HEMODYNAMIC DATA:  Aortic pressure is 137/75 with a mean of 104 mmHg. Left ventricle pressure is 143 with EDP of 18 mmHg.  ANGIOGRAPHIC DATA:  The right coronary artery arises and distributes normally.  It is a dominant vessel.  There is 30% narrowing in the proximal vessel and 20% in the midvessel.  The left coronary artery arises and distributes normally.  The left main coronary is mildly calcified.  There is an 80-90% stenosis in the distal left main extending into the ostia of the circumflex and LAD.  The left anterior descending artery has a complex 95% stenosis in the proximal vessel with moderate-to-severe calcification.  Left circumflex coronary has an 80-90% ostial stenosis.  Left ventricular  angiography performed in the RAO view demonstrates normal left ventricular size with inferior apical hypokinesis and mid anterior hypokinesis.  Ejection fraction is estimated at 50%.  FINAL IMPRESSION: 1. Severe left main and 2-vessel obstructive atherosclerotic coronary     artery disease. 2. Mild left ventricular dysfunction.  PLAN:  The patient will be admitted to telemetry.  He will be started on IV heparin.  We will obtain a CT Surgery consult for bypass surgery.          ______________________________ Peter M. Swaziland, M.D.     PMJ/MEDQ  D:  03/22/2011  T:  03/23/2011  Job:  [id number]  cc:   Julieanne Manson, MD  Electronically Signed by PETER Swaziland M.D. on 03/24/2011 57:84:69 PM

## 2011-03-25 ENCOUNTER — Inpatient Hospital Stay (HOSPITAL_COMMUNITY): Payer: Medicare Other

## 2011-03-25 LAB — BASIC METABOLIC PANEL
BUN: 17 mg/dL (ref 6–23)
CO2: 28 mEq/L (ref 19–32)
Calcium: 8.8 mg/dL (ref 8.4–10.5)
Chloride: 101 mEq/L (ref 96–112)
Creatinine, Ser: 1.15 mg/dL (ref 0.50–1.35)
GFR calc Af Amer: 75 mL/min — ABNORMAL LOW (ref >90)
GFR calc non Af Amer: 65 mL/min — ABNORMAL LOW (ref >90)
Glucose, Bld: 150 mg/dL — ABNORMAL HIGH (ref 70–99)
Potassium: 3.9 mEq/L (ref 3.5–5.1)
Sodium: 136 mEq/L (ref 135–145)

## 2011-03-25 LAB — CBC
HCT: 26.5 % — ABNORMAL LOW (ref 39.0–52.0)
Hemoglobin: 9 g/dL — ABNORMAL LOW (ref 13.0–17.0)
MCH: 34.6 pg — ABNORMAL HIGH (ref 26.0–34.0)
MCHC: 34 g/dL (ref 30.0–36.0)
MCV: 101.9 fL — ABNORMAL HIGH (ref 78.0–100.0)
Platelets: 147 10*3/uL — ABNORMAL LOW (ref 150–400)
RBC: 2.6 MIL/uL — ABNORMAL LOW (ref 4.22–5.81)
RDW: 14.1 % (ref 11.5–15.5)
WBC: 10.2 10*3/uL (ref 4.0–10.5)

## 2011-03-26 NOTE — Op Note (Signed)
NAMEHENDRIXX, SEVERIN NO.:  [id number]  MEDICAL RECORD NO.:  [id number]  LOCATION:                                 FACILITY:  PHYSICIAN:  Salvatore Decent. Cornelius Moras, M.D. DATE OF BIRTH:  11-06-1945  DATE OF PROCEDURE:  03/23/2011 DATE OF DISCHARGE:                              OPERATIVE REPORT   PREOPERATIVE DIAGNOSIS:  Left main disease.  POSTOPERATIVE DIAGNOSIS:  Left main disease.  PROCEDURES:  Median sternotomy for coronary artery bypass grafting x2 using left internal mammary artery to distal left anterior descending coronary artery and left radial artery to obtuse marginal branch of the left circumflex coronary artery.  SURGEON:  Salvatore Decent. Cornelius Moras, MD  ASSISTANT:  Rowe Clack, PA-C  SECOND ASSISTANT:  Mr. Al Corpus.  BRIEF CLINICAL NOTE:  The patient is a 65 year old male with no previous history of coronary artery disease.  The patient describes symptoms of exertional shortness of breath.  Stress test was abnormal prompting elective cardiac catheterization which was performed by Dr. [place]and on March 22, 2011.  Catheterization demonstrates severe left main disease with 2-vessel coronary artery disease and mild left ventricular dysfunction.  A full consultation note has been dictated previously. The patient and his wife have been counseled regarding the indications, risks, and potential benefits of surgery.  The patient provides full informed consent for the operation as described.  OPERATIVE FINDINGS: 1. Normal left ventricular function. 2. Good quality left internal mammary artery and left radial artery     conduit for grafting. 3. Diffusely diseased left anterior descending coronary artery.  OPERATIVE PROCEDURE IN DETAIL:  The patient was brought to the operating room on the above-mentioned date and central monitoring was established by the anesthesia team under the care and direction of Dr. Adonis Huguenin. Specifically, a Swan-Ganz catheter was  placed through the right internal jugular approach.  A radial arterial line was placed.  Intravenous antibiotics were administered.  General endotracheal anesthesia was induced uneventfully.  A Foley catheter was placed.  The patient was placed in the supine position on the operating table.  Baseline transesophageal echocardiogram was performed by Dr. Krista Blue.  This demonstrates essentially normal left ventricular function.  No other significant abnormalities were noted.  The patient's chest, left upper extremity, abdomen, both groins, and both lower extremities are prepared and draped in a sterile manner.  A median sternotomy incision was performed and the left internal mammary artery dissected from the chest wall and prepared for bypass grafting. The left internal mammary artery was good quality conduit. Simultaneously, the left radial artery was removed from the volar aspect of the left forearm through a longitudinal incision.  The intervening side branches of the left radial artery and its associated veins are all divided with the Harmonic Scalpel.  Following systemic heparinization, the left radial artery was transected proximally and distally and the arterial stumps were oversewn with suture ligatures.  The forearm incision was closed in routine fashion.  The left internal mammary artery was transected distally and noted to have excellent flow.  The pericardium was opened.  The ascending aorta was normal in appearance.  The ascending aorta and the right atrium were cannulated for cardiopulmonary  bypass.  Cardiopulmonary bypass was begun and the surface of the heart was inspected.  Distal target vessels were selected for coronary bypass grafting.  A temperature probe was placed in the left ventricular septum and a cardioplegic cannula was placed in the ascending aorta.  The patient was allowed to cool passively to 32 degrees systemic temperature.  The aortic crossclamp was applied  and cold blood cardioplegia was delivered initially in a antegrade fashion through the aortic root.  Iced saline slush was applied for topical hypothermia. The initial cardioplegic arrest was rapid with early diastolic arrest. Repeat doses of cardioplegia was administered through the aortic root intermittently throughout the crossclamp portion of the operation to maintain completely flat electrocardiogram and left ventricular septal myocardial temperature below 15 degrees centigrade.  The following distal coronary anastomoses were performed: 1. The obtuse marginal branch of the left circumflex coronary artery     is grafted with left radial artery in a end-to-side fashion.  This     vessel measures 2.0-mm in diameter and is a good quality target     vessel at the site of distal grafting. 2. The distal left anterior descending coronary artery is grafted with     left internal mammary artery in a end-to-side fashion.  This vessel     measured 1.7-mm in diameter and is a fair quality target vessel.     It is diffusely diseased proximally and distally.  The proximal end of the left radial artery graft was sewn directly to the ascending aorta prior to removal of the aortic crossclamp.  The left ventricular septal temperature rises rapidly with reperfusion of the left internal mammary artery graft.  The aortic crossclamp was removed after a total cross-clamp time of 43 minutes.  The heart began to beat spontaneously without need for cardioversion.  All proximal and distal coronary anastomoses were inspected for hemostasis and appropriate graft orientation.  Epicardial pacing wires were fixed to the right ventricular free wall into the right atrial appendage.  The patient was rewarmed to 37 degrees centigrade temperature.  The patient is weaned from cardiopulmonary bypass without difficulty.  The patient's rhythm at separation from bypass was sinus bradycardia.  Atrial pacing was employed to  increase the heart rate.  No inotropic support was required. Total cardiopulmonary bypass time for the operation is 61 minutes.  The venous and arterial cannulae are removed uneventfully.  Protamine is administered to reverse the anticoagulation.  The mediastinum was irrigated with saline solution.  Meticulous surgical hemostasis was ascertained.  The mediastinum and the left pleural space were drained with 3 chest tubes placed through separate stab incisions inferiorly. The pericardium and soft tissues anterior to the aorta are reapproximated loosely.  The sternum was closed using double-strength sternal wire.  The soft tissues anterior to the sternum were closed in multiple layers and the skin was closed with a running subcuticular skin closure.  The patient tolerated the procedure well and was transported to the Surgical Intensive Care Unit in stable condition.  There are no intraoperative complications.  All sponge, instruments, and needle counts were verified correct at completion of the operation.  No blood products were administered.     Salvatore Decent. Cornelius Moras, M.D.     CHO/MEDQ  D:  03/23/2011  T:  03/23/2011  Job:  [id number]  cc:   Peter M. Sw[place]il[place], M.D. Julieanne Manson, MD  Electronically Signed by Tressie Stalker M.D. on 03/26/2011 12:30:15 PM

## 2011-03-29 ENCOUNTER — Encounter: Payer: Self-pay | Admitting: Cardiothoracic Surgery

## 2011-04-09 ENCOUNTER — Ambulatory Visit (INDEPENDENT_AMBULATORY_CARE_PROVIDER_SITE_OTHER): Payer: Medicare Other | Admitting: Nurse Practitioner

## 2011-04-09 ENCOUNTER — Encounter: Payer: Self-pay | Admitting: Nurse Practitioner

## 2011-04-09 VITALS — BP 104/68 | HR 88 | Ht 73.0 in | Wt 215.0 lb

## 2011-04-09 DIAGNOSIS — M109 Gout, unspecified: Secondary | ICD-10-CM | POA: Insufficient documentation

## 2011-04-09 DIAGNOSIS — Z951 Presence of aortocoronary bypass graft: Secondary | ICD-10-CM

## 2011-04-09 DIAGNOSIS — I2510 Atherosclerotic heart disease of native coronary artery without angina pectoris: Secondary | ICD-10-CM

## 2011-04-09 MED ORDER — METHYLPREDNISOLONE 4 MG PO KIT: PACK | ORAL | Status: AC

## 2011-04-09 MED ORDER — METOPROLOL TARTRATE 25 MG PO TABS: ORAL_TABLET | ORAL | Status: DC

## 2011-04-09 NOTE — Assessment & Plan Note (Signed)
He has had a flare up of his gout. He was given a steroid dose pak to be taken as directed. He will follow up with PCP if needed.

## 2011-04-09 NOTE — Patient Instructions (Signed)
Take just half of the Lopressor twice a day.   Stay on all your other medicines  We are going to check your labs today.  OK to start rehab.  I sent a RX for your medrol dose pak to the drug store.

## 2011-04-09 NOTE — Assessment & Plan Note (Addendum)
He is now s/p CABG x 2. He is doing well. We will check BMET and CBC today. He may start rehab. He is almost out of his pain medicine and I have called Jolene at [facility]. She will be calling him in some hydrocodone. I have asked him to cut his Lopressor in half and to take a half of a tablet BID. I think this will help with the transient dizziness. He will also try to check some blood pressures at home.  We will see him back in about 4 to 6 weeks. Patient is agreeable to this plan and will call if any problems develop in the interim.

## 2011-04-09 NOTE — Progress Notes (Signed)
Jason Tate Date of Birth: [birth year] Medical Record #[id number]  History of Present Illness: Jason Tate is seen back today for a post hospital visit. He is seen for Dr. Sw[place]il[place]. He had CABG x 2 following cath for an abnormal stress echo. He has been home almost 2 weeks. He has done well. Walking for about 6 to 7 minutes in the house. Got outside yesterday. Appetite is ok. Bowels are ok. Sleeping ok. Has had a flare up of his gout and has actually been using his pain meds for the gout. Blood pressure is back to his reported baseline. He has had some mild dizziness. He is only taking the Lopressor just once a day. He is on the tartrate formula. He will be starting rehab next week.   Current Outpatient Prescriptions on File Prior to Visit  Medication Sig Dispense Refill  . allopurinol (ZYLOPRIM) 300 MG tablet Take 300 mg by mouth daily.        . B Complex Vitamins (VITAMIN B COMPLEX PO) Take by mouth 2 (two) times daily.       Marland Kitchen buPROPion (WELLBUTRIN XL) 300 MG 24 hr tablet Take 300 mg by mouth daily.        . Cholecalciferol (VITAMIN D PO) Take 4,000 mg by mouth daily.        . citalopram (CELEXA) 40 MG tablet Take 20 mg by mouth daily.       . clonazePAM (KLONOPIN) 1 MG tablet Take 1 mg by mouth at bedtime.        . Multiple Vitamins-Minerals (PRESERVISION/LUTEIN PO) Take by mouth 3 (three) times daily.        . nabumetone (RELAFEN) 750 MG tablet Take 750 mg by mouth 2 (two) times daily.        . Omega-3 Fatty Acids (FISH OIL PO) Take by mouth.        Marland Kitchen omeprazole (PRILOSEC) 20 MG capsule Take 20 mg by mouth daily.        . simvastatin (ZOCOR) 20 MG tablet Take 20 mg by mouth at bedtime.        Marland Kitchen DISCONTD: aspirin 81 MG tablet Take 162 mg by mouth 2 (two) times daily.         No Known Allergies  Past Medical History  Diagnosis Date  . Sleep apnea, obstructive     on CPAP  . Hypercholesterolemia   . Low testosterone   . PVC (premature ventricular contraction)   . Macular  degeneration   . Obesity   . Abnormal stress echo Oct 2012  . CAD (coronary artery disease) October 2012    s/p CABG x 2 per Dr. Cornelius Moras    Past Surgical History  Procedure Date  . Coronary artery bypass graft Oct 2012    LIMA to LAD and left radial to OM    History  Smoking status  . Former Smoker  Smokeless tobacco  . Not on file    History  Alcohol Use  . Yes    Family History  Problem Relation Age of Onset  . Heart disease Mother   . Stroke Father   . Heart disease Father     Review of Systems: The review of systems is per the HPI. No fever or chills. Some soreness over the left chest from IMA harvesting.  All other systems were reviewed and are negative.  Physical Exam: BP 104/68  Pulse 88  Ht 6\' 1"  (1.854 m)  Wt 215 lb (97.523  kg)  BMI 28.37 kg/m2 Patient is very pleasant and in no acute distress. Skin is warm and dry. Color is normal.  HEENT is unremarkable. Normocephalic/atraumatic. PERRL. Sclera are nonicteric. Neck is supple. No masses. No JVD. Lungs are clear. Cardiac exam shows a regular rate and rhythm. Sternum looks good. No signs of infection. Left radial incision looks ok. Abdomen is soft. Extremities are without edema. Gait and ROM are intact. No gross neurologic deficits noted.   LABORATORY DATA: EKG shows sinus rhythm. Has septal Q's. Tracing is unchanged from his hospital EKG.   Assessment / Plan:

## 2011-04-10 ENCOUNTER — Telehealth: Payer: Self-pay | Admitting: *Deleted

## 2011-04-10 ENCOUNTER — Other Ambulatory Visit: Payer: Self-pay | Admitting: Thoracic Surgery (Cardiothoracic Vascular Surgery)

## 2011-04-10 DIAGNOSIS — I2510 Atherosclerotic heart disease of native coronary artery without angina pectoris: Secondary | ICD-10-CM

## 2011-04-10 LAB — BASIC METABOLIC PANEL
BUN: 15 mg/dL (ref 6–23)
CO2: 26 mEq/L (ref 19–32)
Calcium: 9.4 mg/dL (ref 8.4–10.5)
Chloride: 101 mEq/L (ref 96–112)
Creatinine, Ser: 1 mg/dL (ref 0.4–1.5)
GFR: 75.72 mL/min (ref >60.00)
Glucose, Bld: 101 mg/dL — ABNORMAL HIGH (ref 70–99)
Potassium: 4.6 mEq/L (ref 3.5–5.1)
Sodium: 138 mEq/L (ref 135–145)

## 2011-04-10 LAB — CBC WITH DIFFERENTIAL/PLATELET
Basophils Absolute: 0.5 10*3/uL — ABNORMAL HIGH (ref 0.0–0.1)
Basophils Relative: 4.9 % — ABNORMAL HIGH (ref 0.0–3.0)
Eosinophils Absolute: 0.3 10*3/uL (ref 0.0–0.7)
Eosinophils Relative: 3.1 % (ref 0.0–5.0)
HCT: 29.1 % — ABNORMAL LOW (ref 39.0–52.0)
Hemoglobin: 9.8 g/dL — ABNORMAL LOW (ref 13.0–17.0)
Lymphocytes Relative: 10.5 % — ABNORMAL LOW (ref 12.0–46.0)
Lymphs Abs: 1.1 10*3/uL (ref 0.7–4.0)
MCHC: 33.8 g/dL (ref 30.0–36.0)
MCV: 99.3 fl (ref 78.0–100.0)
Monocytes Absolute: 0.9 10*3/uL (ref 0.1–1.0)
Monocytes Relative: 8.5 % (ref 3.0–12.0)
Neutro Abs: 7.5 10*3/uL (ref 1.4–7.7)
Neutrophils Relative %: 73 % (ref 43.0–77.0)
Platelets: 409 10*3/uL — ABNORMAL HIGH (ref 150.0–400.0)
RBC: 2.93 Mil/uL — ABNORMAL LOW (ref 4.22–5.81)
RDW: 14 % (ref 11.5–14.6)
WBC: 10.3 10*3/uL (ref 4.5–10.5)

## 2011-04-10 NOTE — Telephone Encounter (Signed)
Notified of lab results. Will recheck CBC on next OV. Will send copy to Dr. Sullivan Lone.

## 2011-04-13 ENCOUNTER — Encounter: Payer: Self-pay | Admitting: *Deleted

## 2011-04-16 ENCOUNTER — Ambulatory Visit
Admission: RE | Admit: 2011-04-16 | Discharge: 2011-04-16 | Disposition: A | Discharge status: Home / Self Care | Payer: Medicare Other | Source: Ambulatory Visit | Attending: Thoracic Surgery (Cardiothoracic Vascular Surgery) | Admitting: Thoracic Surgery (Cardiothoracic Vascular Surgery)

## 2011-04-16 ENCOUNTER — Ambulatory Visit (INDEPENDENT_AMBULATORY_CARE_PROVIDER_SITE_OTHER): Payer: Self-pay | Admitting: Surgical

## 2011-04-16 ENCOUNTER — Encounter: Payer: Self-pay | Admitting: Thoracic Surgery (Cardiothoracic Vascular Surgery)

## 2011-04-16 VITALS — BP 132/74 | HR 74 | Resp 20 | Ht 73.0 in | Wt 212.0 lb

## 2011-04-16 DIAGNOSIS — Z951 Presence of aortocoronary bypass graft: Secondary | ICD-10-CM

## 2011-04-16 DIAGNOSIS — I2510 Atherosclerotic heart disease of native coronary artery without angina pectoris: Secondary | ICD-10-CM

## 2011-04-16 NOTE — Patient Instructions (Signed)
The patient has been reminded to continue to avoid any heavy lifting or strenuous use of arms or shoulders for at least a total of three months from the time of surgery. The patient has been instructed that they may return driving an automobile as long as they are no longer requiring oral narcotic pain relievers during the daytime.  They have been advised to start driving short distances during the daylight and gradually increase from there as they feel comfortable.  

## 2011-04-16 NOTE — Progress Notes (Signed)
  HPI: Patient returns for routine postoperative follow-up having undergone CABG x2 using LIMA to LAD and LRA to OM on 03/23/2011. The patient's early postoperative recovery while in the hospital was uneventful. Since hospital discharge the patient reports excellent progress.  He did have a flareup of gout involving his elbow and toe which has now resolved after taking a brief Dosepak of prednisone. He has mild residual soreness in his chest. He has no shortness of breath. His appetite is improving nicely. He is sleeping well at night. He plans to start the cardiac rehabilitation program next week. He has no complaints.   Current Outpatient Prescriptions  Medication Sig Dispense Refill  . allopurinol (ZYLOPRIM) 300 MG tablet Take 300 mg by mouth daily.        Marland Kitchen aspirin 325 MG tablet Take 325 mg by mouth daily.        . B Complex Vitamins (VITAMIN B COMPLEX PO) Take by mouth 2 (two) times daily.       Marland Kitchen buPROPion (WELLBUTRIN XL) 300 MG 24 hr tablet Take 300 mg by mouth daily.        . Cholecalciferol (VITAMIN D PO) Take 4,000 mg by mouth daily.        . citalopram (CELEXA) 40 MG tablet Take 20 mg by mouth daily.       . clonazePAM (KLONOPIN) 1 MG tablet Take 1 mg by mouth at bedtime.        . methylPREDNISolone (MEDROL DOSEPAK) 4 MG tablet follow package directions  21 tablet  0  . metoprolol tartrate (LOPRESSOR) 25 MG tablet Take a half of a tablet two times a day      . Multiple Vitamins-Minerals (PRESERVISION/LUTEIN PO) Take by mouth 3 (three) times daily.        . nabumetone (RELAFEN) 750 MG tablet Take 750 mg by mouth 2 (two) times daily.        . Omega-3 Fatty Acids (FISH OIL PO) Take by mouth.        Marland Kitchen omeprazole (PRILOSEC) 20 MG capsule Take 20 mg by mouth daily.        Marland Kitchen oxycodone (OXY-IR) 5 MG capsule Take 5 mg by mouth every 4 (four) hours as needed.        . simvastatin (ZOCOR) 20 MG tablet Take 20 mg by mouth at bedtime.        . Testosterone (ANDROGEL PUMP) 20.25 MG/ACT (1.62%)  GEL Apply 8 sprays topically 1 day or 1 dose.        . vardenafil (LEVITRA) 20 MG tablet Take 20 mg by mouth daily as needed.          Physical Exam: Patient is a well-appearing male with all his surgical incisions healing nicely. Breath sounds are clear to auscultation and symmetrical bilaterally. Cardiovascular exam is notable for regular rate and rhythm. The abdomen is soft and nontender. The left radial artery harvest incision is healed nicely. The remainder of his physical exam is unremarkable.  Diagnostic Tests: Chest x-ray performed today demonstrates clear lung fields bilaterally. All the sternal wires appear intact. There no significant pleural effusions. No other abnormalities are noted.  Impression: Excellent progress following coronary artery bypass grafting x2  Plan: The patient will call and return to see [place] in the future as needed. Further patient instructions are listed separately.

## 2011-04-18 ENCOUNTER — Encounter (HOSPITAL_COMMUNITY)
Admission: RE | Admit: 2011-04-18 | Discharge: 2011-04-18 | Disposition: A | Discharge status: Home / Self Care | Payer: Medicare Other | Source: Ambulatory Visit | Attending: Cardiology | Admitting: Cardiology

## 2011-04-18 ENCOUNTER — Other Ambulatory Visit: Payer: Self-pay

## 2011-04-18 DIAGNOSIS — G4733 Obstructive sleep apnea (adult) (pediatric): Secondary | ICD-10-CM | POA: Insufficient documentation

## 2011-04-18 DIAGNOSIS — Z7982 Long term (current) use of aspirin: Secondary | ICD-10-CM | POA: Insufficient documentation

## 2011-04-18 DIAGNOSIS — M109 Gout, unspecified: Secondary | ICD-10-CM | POA: Insufficient documentation

## 2011-04-18 DIAGNOSIS — Z951 Presence of aortocoronary bypass graft: Secondary | ICD-10-CM

## 2011-04-18 DIAGNOSIS — Z5189 Encounter for other specified aftercare: Secondary | ICD-10-CM | POA: Insufficient documentation

## 2011-04-18 DIAGNOSIS — E669 Obesity, unspecified: Secondary | ICD-10-CM | POA: Insufficient documentation

## 2011-04-18 DIAGNOSIS — I2510 Atherosclerotic heart disease of native coronary artery without angina pectoris: Secondary | ICD-10-CM | POA: Insufficient documentation

## 2011-04-18 DIAGNOSIS — H3530 Unspecified macular degeneration: Secondary | ICD-10-CM | POA: Insufficient documentation

## 2011-04-18 DIAGNOSIS — Z87891 Personal history of nicotine dependence: Secondary | ICD-10-CM | POA: Insufficient documentation

## 2011-04-18 MED ORDER — HYDROCODONE-ACETAMINOPHEN 7.5-500 MG PO TABS: 1.0000 | ORAL_TABLET | ORAL | Status: DC | PRN

## 2011-04-18 NOTE — Progress Notes (Signed)
Jason Tate 65 y.o. male       Nutrition Screen                                                                    YES  NO Do you live in a nursing home?  X   Do you eat out more than 3 times/week?    X If yes, how many times per week do you eat out?  Do you have food allergies?   X If yes, what are you allergic to?  Have you gained or lost more than 10 lbs without trying?               X If yes, how much weight have you lost and over what time period?  lbs gained or lost over  weeks/month  Do you want to lose weight?    X  If yes, what is a goal weight or amount of weight you would like to lose? 15-20 lbs  Do you eat alone most of the time?   X   Do you eat less than 2 meals/day?  X If yes, how many meals do you eat?  Do you drink more than 3 alcohol drinks/day? X  If yes, how many drinks per day? 3-4  Are you having trouble with constipation? *  X If yes, what are you doing to help relieve constipation?  Do you have financial difficulties with buying food?*    X   Are you experiencing regular nausea/ vomiting?*     X   Do you have a poor appetite? *                                        X   Do you have trouble chewing/swallowing? *   X    Pt with diagnoses of:  X CABG              X Gout      X Dyslipidemia  / HDL< 40 / LDL>70 / High TG      X %  Body fat >goal / Body Mass Index >25       Pt Risk Score    2       Diagnosis Risk Score  20       Total Risk Score   22                         High Risk               X Low Risk              HT: 73.25" Ht Readings from Last 1 Encounters:  04/16/11 6\' 1"  (1.854 m)    WT: 214.7 lb (97.6 kg) Wt Readings from Last 3 Encounters:  04/16/11 212 lb (96.163 kg)  04/09/11 215 lb (97.523 kg)  03/20/11 222 lb 1.9 oz (100.753 kg)     IBW 84.3 116%IBW BMI 28.2 33.4%body fat  Meds:  Past Medical History  Diagnosis Date  . Sleep apnea, obstructive     on CPAP  . Hypercholesterolemia   .  Low testosterone   . PVC (premature  ventricular contraction)   . Macular degeneration   . Obesity   . Abnormal stress echo Oct 2012  . CAD (coronary artery disease) October 2012    s/p CABG x 2 per Dr. Cornelius Moras        Activity level: Pt is moderately active  Wt goal: 190-202 lb ( 86.4-91.8 kg) Current tobacco use? No      Food/Drug Interaction? No       Labs:  Lipid Panel     Component Value Date/Time   CHOL 185 03/20/2011 1151   TRIG 189.0* 03/20/2011 1151   HDL 51.50 03/20/2011 1151   CHOLHDL 4 03/20/2011 1151   VLDL 37.8 03/20/2011 1151   LDLCALC 96 03/20/2011 1151     Lab Results  Component Value Date   HGBA1C 5.2 03/22/2011    03/22/11 Glucose 150   LDL goal:    < 100       MI, DM, Carotid or PVD and > 2:      tobacco, HTN, HDL, family h/o, lipoprotein a     > 67 yo male or        >67 yo male   Estimated Daily Nutrition Needs for: ? wt loss 1700-2200 Kcal , Total Fat 45-60gm, Saturated Fat 13-17 gm, Trans Fat 1.7-2.2 gm,  Sodium less than 1500 mg

## 2011-04-18 NOTE — Telephone Encounter (Signed)
Rx for Hydrocodone 7.5/500 mg po every 4-6 hours prn for pain #40 no refills called to [facility].

## 2011-04-18 NOTE — Progress Notes (Signed)
Pt in today for first day of cardiac rehab.  Pt tolerated light exercise with no complaints of chest pain or sob.  Monitor showed SR with inverted t wave and negative qrs with occ pvc.  Continue to monitor.

## 2011-04-20 ENCOUNTER — Encounter (HOSPITAL_COMMUNITY): Payer: Medicare Other

## 2011-04-23 ENCOUNTER — Encounter (HOSPITAL_COMMUNITY)
Admission: RE | Admit: 2011-04-23 | Discharge: 2011-04-23 | Disposition: A | Discharge status: Home / Self Care | Payer: Medicare Other | Source: Ambulatory Visit | Attending: Cardiology | Admitting: Cardiology

## 2011-04-25 ENCOUNTER — Encounter (HOSPITAL_COMMUNITY)
Admission: RE | Admit: 2011-04-25 | Discharge: 2011-04-25 | Disposition: A | Discharge status: Home / Self Care | Payer: Medicare Other | Source: Ambulatory Visit | Attending: Cardiology | Admitting: Cardiology

## 2011-04-25 NOTE — Progress Notes (Signed)
Reviewed home exercise with pt today.  Pt plans to walk and go the [facility] for exercise.  Reviewed THR, pulse, RPE, sign and symptoms, and when to call 911 or MD.  Pt voiced understanding.

## 2011-04-27 ENCOUNTER — Encounter (HOSPITAL_COMMUNITY)
Admission: RE | Admit: 2011-04-27 | Discharge: 2011-04-27 | Disposition: A | Discharge status: Home / Self Care | Payer: Medicare Other | Source: Ambulatory Visit | Attending: Cardiology | Admitting: Cardiology

## 2011-04-30 ENCOUNTER — Encounter (HOSPITAL_COMMUNITY)
Admission: RE | Admit: 2011-04-30 | Discharge: 2011-04-30 | Disposition: A | Discharge status: Home / Self Care | Payer: Medicare Other | Source: Ambulatory Visit | Attending: Cardiology | Admitting: Cardiology

## 2011-04-30 DIAGNOSIS — I2510 Atherosclerotic heart disease of native coronary artery without angina pectoris: Secondary | ICD-10-CM | POA: Insufficient documentation

## 2011-04-30 DIAGNOSIS — G4733 Obstructive sleep apnea (adult) (pediatric): Secondary | ICD-10-CM | POA: Insufficient documentation

## 2011-04-30 DIAGNOSIS — H3530 Unspecified macular degeneration: Secondary | ICD-10-CM | POA: Insufficient documentation

## 2011-04-30 DIAGNOSIS — Z87891 Personal history of nicotine dependence: Secondary | ICD-10-CM | POA: Insufficient documentation

## 2011-04-30 DIAGNOSIS — Z951 Presence of aortocoronary bypass graft: Secondary | ICD-10-CM | POA: Insufficient documentation

## 2011-04-30 DIAGNOSIS — M109 Gout, unspecified: Secondary | ICD-10-CM | POA: Insufficient documentation

## 2011-04-30 DIAGNOSIS — Z5189 Encounter for other specified aftercare: Secondary | ICD-10-CM | POA: Insufficient documentation

## 2011-04-30 DIAGNOSIS — Z7982 Long term (current) use of aspirin: Secondary | ICD-10-CM | POA: Insufficient documentation

## 2011-04-30 DIAGNOSIS — E669 Obesity, unspecified: Secondary | ICD-10-CM | POA: Insufficient documentation

## 2011-04-30 NOTE — Progress Notes (Signed)
Jason Tate 65 y.o. male Nutrition Note  Spoke with pt.  Nutrition Plan reviewed with pt.  Pt states he wants to lose wt, "but I'm going in the wrong direction today."  Pt wants to lose wt and c/o his appetite returning and celebrating his birthday this past weekend for his wt gain.  Pt states he has followed a "low carb diet" in the past, which reportedly worked.  Pt states he avoided white bread, flour, and rice as part of his low carb diet. Weight loss tips discussed.    Nutrition Diagnosis   Food-and nutrition-related knowledge deficit related to lack of exposure to information as related to diagnosis of: ? CVD    Overweight related to excessive energy intake as evidenced by a BMI of 28.1  Nutrition RX/ Estimated Daily Nutrition Needs for: wt loss 1700-2200 Kcal, 45-60 gm fat, 13-17 gm sat fat, 1.7-2.2 gm trans-fat, <1500 mg sodium Nutrition Intervention   Pt's individual nutrition plan including cholesterol goals reviewed with pt.   Pt to attend the Portion Distortion class   Pt to attend the  ? Nutrition I class          ? Nutrition II class    Pt given handouts for: ? wt loss   Continue client-centered nutrition education by RD, as part of interdisciplinary care. Goal(s)   Pt to identify and limit food sources of saturated fat, trans fat, and cholesterol   Pt to identify food quantities necessary to achieve: ? wt loss to a goal wt of 190-202 lb (86.4-91.8 kg) at graduation from cardiac rehab.  Monitor and Evaluate progress toward nutrition goal with team.

## 2011-05-02 ENCOUNTER — Encounter (HOSPITAL_COMMUNITY)
Admission: RE | Admit: 2011-05-02 | Discharge: 2011-05-02 | Disposition: A | Discharge status: Home / Self Care | Payer: Medicare Other | Source: Ambulatory Visit | Attending: Cardiology | Admitting: Cardiology

## 2011-05-04 ENCOUNTER — Encounter (HOSPITAL_COMMUNITY): Payer: Medicare Other

## 2011-05-07 ENCOUNTER — Encounter (HOSPITAL_COMMUNITY)
Admission: RE | Admit: 2011-05-07 | Discharge: 2011-05-07 | Disposition: A | Discharge status: Home / Self Care | Payer: Medicare Other | Source: Ambulatory Visit | Attending: Cardiology | Admitting: Cardiology

## 2011-05-08 ENCOUNTER — Encounter: Payer: Self-pay | Admitting: Cardiology

## 2011-05-09 ENCOUNTER — Encounter (HOSPITAL_COMMUNITY)
Admission: RE | Admit: 2011-05-09 | Discharge: 2011-05-09 | Disposition: A | Discharge status: Home / Self Care | Payer: Medicare Other | Source: Ambulatory Visit | Attending: Cardiology | Admitting: Cardiology

## 2011-05-11 ENCOUNTER — Encounter (HOSPITAL_COMMUNITY)
Admission: RE | Admit: 2011-05-11 | Discharge: 2011-05-11 | Disposition: A | Discharge status: Home / Self Care | Payer: Medicare Other | Source: Ambulatory Visit | Attending: Cardiology | Admitting: Cardiology

## 2011-05-14 ENCOUNTER — Encounter (HOSPITAL_COMMUNITY)
Admission: RE | Admit: 2011-05-14 | Discharge: 2011-05-14 | Disposition: A | Discharge status: Home / Self Care | Payer: Medicare Other | Source: Ambulatory Visit | Attending: Cardiology | Admitting: Cardiology

## 2011-05-15 NOTE — Discharge Summary (Signed)
NAMEMASSIMO, HARTLAND NO.:  [id number]  MEDICAL RECORD NO.:  [id number]  LOCATION:  2018                         FACILITY:  [facility]  PHYSICIAN:  Salvatore Decent. Cornelius Moras, M.D. DATE OF BIRTH:  Apr 11, 1946  DATE OF ADMISSION:  03/22/2011 DATE OF DISCHARGE:  03/27/2011                              DISCHARGE SUMMARY   HISTORY:  The patient is a 65 year old gentleman with no previous history of coronary artery disease and essentially no risk factors other than the fact that the patient's father did have coronary artery disease.  The patient reports that he has had some decreased energy for several months and perhaps for as much as a year or so.  He also has had some episodes of shortness of breath with strenuous physical activity. He was recently evaluated by Dr. Swaziland and a stress echocardiogram was done.  This was abnormal at baseline and ejection fraction was measured at 50-55% with peak exercise ejection fraction dropped to 35-40%.  The patient reports that his symptoms of shortness of breath, were reproduced during the stress procedure.  The patient was subsequently felt to require cardiac catheterization and was admitted this hospitalization for the procedure.  PAST MEDICAL HISTORY: 1. Obstructive sleep apnea, on CPAP at night. 2. Hypercholesterolemia 3. Low serum testosterone. 4. Macular degeneration. 5. History of gout.  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  FAMILY HISTORY:  Notable that the patient's father and mother both had ischemic heart disease but not at a young age.  SOCIAL HISTORY:  The patient is retired and lives in [place].  He is a former cigarette smoker but quit in 1981.  He does smoke an occasional cigar.  He does not use much alcohol but occasional.  MEDICATIONS PRIOR TO ADMISSION: 1. Allopurinol. 2. Aspirin. 3. Vitamin B complex. 4. Wellbutrin. 5. Vitamin D. 6. Celexa. 7. Multivitamin. 8.  Relafen. 9. Osteo Bi-Flex. 10.Omega-3 fatty acids. 11.Prilosec. 12.AndroGel.  DRUG ALLERGIES:  No known drug allergies.  REVIEW OF SYMPTOMS/PHYSICAL EXAMINATION:  Please see the history and physical done at the time of admission.  HOSPITAL COURSE:  The patient was admitted electively and taken to cardiac catheterization lab.  This was performed by Dr. Peter Swaziland. Findings were remarkable for right coronary artery showing a 30% proximal stenosis and 20% mid artery stenosis.  Left main had an 80-90% distal stenosis extruding into the ostium of the LAD and circumflex. The LAD had a complex 95% proximal stenosis which was calcified.  The left circumflex had an 80-90% ostial stenosis.  Left ventriculogram showed inferoapical hypokinesis with an ejection fraction of approximately 50%.  Due to these findings, surgical consultation was obtained with Tressie Stalker, MD who evaluated the patient and studies and agreed with recommendations to proceed with surgical revascularization.  The patient had preoperative pulmonary function studies which were unremarkable.  His preoperative arterial examination of carotids were unremarkable.  PROCEDURE:  On March 23, 2011, he was taken to the operating room where he underwent the following procedure.  Coronary artery bypass grafting x2 using left internal mammary artery to the distal left anterior descending coronary artery and a left radial artery to the obtuse marginal branch  of the left circumflex coronary artery.  The patient tolerated the procedure well and was taken to the Surgical Intensive Care Unit in stable condition.  POSTOPERATIVE HOSPITAL COURSE:  The patient has done well.  He was extubated uneventfully.  He has had no significant cardiac dysrhythmias. All routine lines, monitors, and drainage devices have been discontinued in the standard fashion.  He does have an acute blood loss anemia.  His values are stabilized.  His most  recent hemoglobin and hematocrit dated March 25, 2011 are 9.0 and 26.5 respectively.  Electrolytes, BUN, and creatinine are within normal limits.  He did have some mild volume overload but has responded well to gentle diuresis.  It has been recommended by the pharmacy that he reduce his Celexa dose due to increasing risks of QT prolongation with increased age.  It was reduced to 20 mg.  The patient may have this adjusted by his primary physician at a later date and they deemed warranted.  The patient's left arm is neurovascularly intact following radial artery grafting.  His incisions are healing well without evidence of infection.  He has been weaned from oxygen and maintained good saturations on room air.  He is to continue CPAP at night.  He is tolerating activities to commensurate for level of postoperative convalescence using standard cardiac rehabilitation modalities.  His overall status is felt to be stable for discharge in the morning of March 27, 2011 pending morning round re-evaluation.  MEDICATIONS AT DISCHARGE:  At the time of this dictation: 1. Aspirin enteric-coated 325 mg p.o. daily. 2. Celexa 20 mg p.o. daily. 3. Metoprolol 25 mg twice daily. 4. Oxycodone 5 mg IR tablet 1-2 every 4-6 hours p.r.n. 5. Zocor 20 mg daily. 6. Allopurinol 300 mg daily. 7. AndroGel topical 1.62% daily. 8. Fish oil 1 capsule daily. 9. Klonopin 1 mg at bedtime p.r.n. 10.Osteo Bi-Flex 1500 mg 2 tablets daily. 11.PreserVision. 12.Beta-carotene with mineral supplement 1 tablet 3 times a day. 13.Prilosec 20 mg daily. 14.Relafen 750 mg twice daily. 15.Vitamin B complex twice daily. 16.Vitamin D 4000 units daily. 17.Wellbutrin 300 mg daily.  INSTRUCTIONS:  The patient will receive written instructions regarding medications, activity, diet, wound care, and follow up.  Followup to include Dr. Cornelius Moras on November 19 at 11 a.m.  The patient is instructed to follow up with Dr. Swaziland in 2 weeks  post discharge.  FINAL DIAGNOSES:  Severe left main and 2 vessel coronary artery disease status post surgical revascularization as described.  OTHER DIAGNOSES: 1. Postoperative acute blood loss anemia, expected, stable,     postoperative volume overload expected, hypercholesterolemia,     history of obstructive sleep apnea, history of serum low     testosterone, history of premature ventricular contractions,     history of macular degeneration. 2. Obesity. 3. Former tobacco abuse. 4. Strong family history for coronary artery disease.     Rowe Clack, P.A.-C.   ______________________________ Salvatore Decent Cornelius Moras, M.D.    Sherryll Burger  D:  03/26/2011  T:  03/26/2011  Job:  [id number]  cc:   Salvatore Decent. Cornelius Moras, M.D. Peter M. Swaziland, M.D. Julieanne Manson, MD  Electronically Signed by Gershon Crane P.A.-C. on 04/06/2011 12:55:42 PM Electronically Signed by Tressie Stalker M.D. on 05/15/2011 07:49:55 AM

## 2011-05-16 ENCOUNTER — Encounter (HOSPITAL_COMMUNITY)
Admission: RE | Admit: 2011-05-16 | Discharge: 2011-05-16 | Disposition: A | Discharge status: Home / Self Care | Payer: Medicare Other | Source: Ambulatory Visit | Attending: Cardiology | Admitting: Cardiology

## 2011-05-18 ENCOUNTER — Ambulatory Visit (INDEPENDENT_AMBULATORY_CARE_PROVIDER_SITE_OTHER): Payer: BC Managed Care – PPO | Admitting: Nurse Practitioner

## 2011-05-18 ENCOUNTER — Encounter (HOSPITAL_COMMUNITY)
Admission: RE | Admit: 2011-05-18 | Discharge: 2011-05-18 | Disposition: A | Discharge status: Home / Self Care | Payer: Medicare Other | Source: Ambulatory Visit | Attending: Cardiology | Admitting: Cardiology

## 2011-05-18 ENCOUNTER — Other Ambulatory Visit: Payer: Medicare Other | Admitting: *Deleted

## 2011-05-18 ENCOUNTER — Encounter: Payer: Self-pay | Admitting: Nurse Practitioner

## 2011-05-18 VITALS — BP 120/78 | HR 66 | Ht 73.0 in | Wt 216.6 lb

## 2011-05-18 DIAGNOSIS — I2510 Atherosclerotic heart disease of native coronary artery without angina pectoris: Secondary | ICD-10-CM

## 2011-05-18 NOTE — Progress Notes (Signed)
[name] Date of Birth: Jun 06, 1945 Medical Record #[id number]  History of Present Illness: Mr. Orosz is seen back today for a 6 week check. He is seen for Dr. Swaziland. He is about 8 weeks out from his CABG x 2. He is doing well. Enjoying rehab. Blood pressure is good at home. Just had complete labs with his PCP last week. He is tolerating his medicines and overall, feels fine.   Current Outpatient Prescriptions on File Prior to Visit  Medication Sig Dispense Refill  . allopurinol (ZYLOPRIM) 300 MG tablet Take 300 mg by mouth daily.        Marland Kitchen aspirin 325 MG tablet Take 325 mg by mouth daily.        . B Complex Vitamins (VITAMIN B COMPLEX PO) Take by mouth 2 (two) times daily.       Marland Kitchen buPROPion (WELLBUTRIN XL) 300 MG 24 hr tablet Take 300 mg by mouth daily.        . Cholecalciferol (VITAMIN D PO) Take 4,000 mg by mouth daily.        . citalopram (CELEXA) 40 MG tablet Take 20 mg by mouth daily.       . clonazePAM (KLONOPIN) 1 MG tablet Take 1 mg by mouth at bedtime.        . Multiple Vitamins-Minerals (PRESERVISION/LUTEIN PO) Take by mouth 3 (three) times daily.        . nabumetone (RELAFEN) 750 MG tablet Take 750 mg by mouth 2 (two) times daily.        . Omega-3 Fatty Acids (FISH OIL PO) Take by mouth.        Marland Kitchen omeprazole (PRILOSEC) 20 MG capsule Take 20 mg by mouth daily.        . simvastatin (ZOCOR) 20 MG tablet Take 20 mg by mouth at bedtime.        . vardenafil (LEVITRA) 20 MG tablet Take 20 mg by mouth daily as needed.          No Known Allergies  Past Medical History  Diagnosis Date  . Sleep apnea, obstructive     on CPAP  . Hypercholesterolemia   . Low testosterone   . PVC (premature ventricular contraction)   . Macular degeneration   . Obesity   . Abnormal stress echo Oct 2012  . CAD (coronary artery disease) October 2012    s/p CABG x 2 per Dr. Cornelius Moras    Past Surgical History  Procedure Date  . Coronary artery bypass graft Oct 2012    LIMA to LAD and left  radial to OM    History  Smoking status  . Former Smoker  Smokeless tobacco  . Not on file    History  Alcohol Use  . Yes    Family History  Problem Relation Age of Onset  . Heart disease Mother   . Stroke Father   . Heart disease Father     Review of Systems: The review of systems is per the HPI.  All other systems were reviewed and are negative.  Physical Exam: BP 120/78  Pulse 66  Ht 6\' 1"  (1.854 m)  Wt 216 lb 9.6 oz (98.249 kg)  BMI 28.58 kg/m2 Patient is very pleasant and in no acute distress. Skin is warm and dry. Color is normal.  HEENT is unremarkable. Normocephalic/atraumatic. PERRL. Sclera are nonicteric. Neck is supple. No masses. No JVD. Lungs are clear. Cardiac exam shows a regular rate and rhythm. Sternum looks good. Does  have keloid over left forearm from radial harvesting. Abdomen is soft. Extremities are without edema. Gait and ROM are intact. No gross neurologic deficits noted.   LABORATORY DATA:   Assessment / Plan:

## 2011-05-18 NOTE — Patient Instructions (Addendum)
You may resume your sexual activities.  Keep up the exercise and working on your diet.  I will have you see Dr. Swaziland in 3 months.    Call the [facility] office at [phone] if you have any questions, problems or concerns.

## 2011-05-18 NOTE — Assessment & Plan Note (Signed)
He is 8 weeks out from his CABG. He is doing well. He is enjoying rehab. Encouraged him to continue with his efforts at diet and weight loss. He has already has his labs checked by his primary care. Those results should be coming our way. No change in his medicines. I will have him see Dr. Swaziland in 3 months. Patient is agreeable to this plan and will call if any problems develop in the interim.

## 2011-05-21 ENCOUNTER — Encounter (HOSPITAL_COMMUNITY): Payer: Medicare Other

## 2011-05-23 ENCOUNTER — Encounter (HOSPITAL_COMMUNITY): Payer: Medicare Other

## 2011-05-25 ENCOUNTER — Encounter (HOSPITAL_COMMUNITY): Payer: Medicare Other

## 2011-05-28 ENCOUNTER — Encounter (HOSPITAL_COMMUNITY): Payer: Medicare Other

## 2011-05-30 ENCOUNTER — Encounter (HOSPITAL_COMMUNITY): Payer: Medicare Other

## 2011-05-30 DIAGNOSIS — M109 Gout, unspecified: Secondary | ICD-10-CM | POA: Insufficient documentation

## 2011-05-30 DIAGNOSIS — Z951 Presence of aortocoronary bypass graft: Secondary | ICD-10-CM | POA: Insufficient documentation

## 2011-05-30 DIAGNOSIS — G4733 Obstructive sleep apnea (adult) (pediatric): Secondary | ICD-10-CM | POA: Insufficient documentation

## 2011-05-30 DIAGNOSIS — Z87891 Personal history of nicotine dependence: Secondary | ICD-10-CM | POA: Insufficient documentation

## 2011-05-30 DIAGNOSIS — Z7982 Long term (current) use of aspirin: Secondary | ICD-10-CM | POA: Insufficient documentation

## 2011-05-30 DIAGNOSIS — H3530 Unspecified macular degeneration: Secondary | ICD-10-CM | POA: Insufficient documentation

## 2011-05-30 DIAGNOSIS — I2510 Atherosclerotic heart disease of native coronary artery without angina pectoris: Secondary | ICD-10-CM | POA: Insufficient documentation

## 2011-05-30 DIAGNOSIS — Z5189 Encounter for other specified aftercare: Secondary | ICD-10-CM | POA: Insufficient documentation

## 2011-05-30 DIAGNOSIS — E669 Obesity, unspecified: Secondary | ICD-10-CM | POA: Insufficient documentation

## 2011-06-01 ENCOUNTER — Encounter (HOSPITAL_COMMUNITY)
Admission: RE | Admit: 2011-06-01 | Discharge: 2011-06-01 | Disposition: A | Discharge status: Home / Self Care | Payer: Medicare Other | Source: Ambulatory Visit | Attending: Cardiology | Admitting: Cardiology

## 2011-06-01 DIAGNOSIS — M109 Gout, unspecified: Secondary | ICD-10-CM | POA: Diagnosis not present

## 2011-06-01 DIAGNOSIS — Z7982 Long term (current) use of aspirin: Secondary | ICD-10-CM | POA: Diagnosis not present

## 2011-06-01 DIAGNOSIS — G4733 Obstructive sleep apnea (adult) (pediatric): Secondary | ICD-10-CM | POA: Diagnosis not present

## 2011-06-01 DIAGNOSIS — I2510 Atherosclerotic heart disease of native coronary artery without angina pectoris: Secondary | ICD-10-CM | POA: Diagnosis not present

## 2011-06-01 DIAGNOSIS — Z87891 Personal history of nicotine dependence: Secondary | ICD-10-CM | POA: Diagnosis not present

## 2011-06-01 DIAGNOSIS — Z5189 Encounter for other specified aftercare: Secondary | ICD-10-CM | POA: Diagnosis not present

## 2011-06-01 DIAGNOSIS — Z951 Presence of aortocoronary bypass graft: Secondary | ICD-10-CM | POA: Diagnosis not present

## 2011-06-01 DIAGNOSIS — H3530 Unspecified macular degeneration: Secondary | ICD-10-CM | POA: Diagnosis not present

## 2011-06-01 DIAGNOSIS — E669 Obesity, unspecified: Secondary | ICD-10-CM | POA: Diagnosis not present

## 2011-06-04 ENCOUNTER — Encounter (HOSPITAL_COMMUNITY)
Admission: RE | Admit: 2011-06-04 | Discharge: 2011-06-04 | Disposition: A | Discharge status: Home / Self Care | Payer: Medicare Other | Source: Ambulatory Visit | Attending: Cardiology | Admitting: Cardiology

## 2011-06-04 NOTE — Progress Notes (Signed)
Jason Tate 66 y.o. male Nutrition Note Spoke with pt.  Nutrition survey reviewed with pt. Pt currently following a step 1 Heart Healthy diet.  Nutrition Diagnosis   Food-and nutrition-related knowledge deficit related to lack of exposure to information as related to diagnosis of: ? CVD    Overweight related to excessive energy intake as evidenced by a BMI of 28.1  Nutrition RX/ Estimated Daily Nutrition Needs for: wt loss 1700-2200 Kcal, 45-60 gm fat, 13-17 gm sat fat, 1.7-2.2 gm trans-fat, <1500 mg sodium Nutrition Intervention   Benefits of adopting Therapeutic Lifestyle Changes discussed when Medficts reviewed.   Pt to attend the Portion Distortion class   Pt to attend the  ? Nutrition I class          ? Nutrition II class    Pt given handouts for: ? wt loss   Continue client-centered nutrition education by RD, as part of interdisciplinary care. Goal(s)   Pt to identify and limit food sources of saturated fat, trans fat, and cholesterol   Pt to identify food quantities necessary to achieve: ? wt loss to a goal wt of 190-202 lb (86.4-91.8 kg) at graduation from cardiac rehab.  Monitor and Evaluate progress toward nutrition goal with team.

## 2011-06-06 ENCOUNTER — Encounter (HOSPITAL_COMMUNITY)
Admission: RE | Admit: 2011-06-06 | Discharge: 2011-06-06 | Disposition: A | Discharge status: Home / Self Care | Payer: Medicare Other | Source: Ambulatory Visit | Attending: Cardiology | Admitting: Cardiology

## 2011-06-08 ENCOUNTER — Encounter (HOSPITAL_COMMUNITY)
Admission: RE | Admit: 2011-06-08 | Discharge: 2011-06-08 | Disposition: A | Discharge status: Home / Self Care | Payer: Medicare Other | Source: Ambulatory Visit | Attending: Cardiology | Admitting: Cardiology

## 2011-06-11 ENCOUNTER — Encounter (HOSPITAL_COMMUNITY)
Admission: RE | Admit: 2011-06-11 | Discharge: 2011-06-11 | Disposition: A | Discharge status: Home / Self Care | Payer: Medicare Other | Source: Ambulatory Visit | Attending: Cardiology | Admitting: Cardiology

## 2011-06-11 DIAGNOSIS — F331 Major depressive disorder, recurrent, moderate: Secondary | ICD-10-CM | POA: Diagnosis not present

## 2011-06-13 ENCOUNTER — Encounter (HOSPITAL_COMMUNITY)
Admission: RE | Admit: 2011-06-13 | Discharge: 2011-06-13 | Disposition: A | Discharge status: Home / Self Care | Payer: Medicare Other | Source: Ambulatory Visit | Attending: Cardiology | Admitting: Cardiology

## 2011-06-15 ENCOUNTER — Encounter (HOSPITAL_COMMUNITY)
Admission: RE | Admit: 2011-06-15 | Discharge: 2011-06-15 | Disposition: A | Discharge status: Home / Self Care | Payer: Medicare Other | Source: Ambulatory Visit | Attending: Cardiology | Admitting: Cardiology

## 2011-06-18 ENCOUNTER — Encounter (HOSPITAL_COMMUNITY)
Admission: RE | Admit: 2011-06-18 | Discharge: 2011-06-18 | Disposition: A | Discharge status: Home / Self Care | Payer: Medicare Other | Source: Ambulatory Visit | Attending: Cardiology | Admitting: Cardiology

## 2011-06-20 ENCOUNTER — Encounter (HOSPITAL_COMMUNITY)
Admission: RE | Admit: 2011-06-20 | Discharge: 2011-06-20 | Disposition: A | Discharge status: Home / Self Care | Payer: Medicare Other | Source: Ambulatory Visit | Attending: Cardiology | Admitting: Cardiology

## 2011-06-22 ENCOUNTER — Encounter (HOSPITAL_COMMUNITY)
Admission: RE | Admit: 2011-06-22 | Discharge: 2011-06-22 | Disposition: A | Discharge status: Home / Self Care | Payer: Medicare Other | Source: Ambulatory Visit | Attending: Cardiology | Admitting: Cardiology

## 2011-06-25 ENCOUNTER — Encounter (HOSPITAL_COMMUNITY)
Admission: RE | Admit: 2011-06-25 | Discharge: 2011-06-25 | Disposition: A | Discharge status: Home / Self Care | Payer: Medicare Other | Source: Ambulatory Visit | Attending: Cardiology | Admitting: Cardiology

## 2011-06-27 ENCOUNTER — Encounter (HOSPITAL_COMMUNITY)
Admission: RE | Admit: 2011-06-27 | Discharge: 2011-06-27 | Disposition: A | Discharge status: Home / Self Care | Payer: Medicare Other | Source: Ambulatory Visit | Attending: Cardiology | Admitting: Cardiology

## 2011-06-29 ENCOUNTER — Encounter (HOSPITAL_COMMUNITY)
Admission: RE | Admit: 2011-06-29 | Discharge: 2011-06-29 | Disposition: A | Discharge status: Home / Self Care | Payer: Medicare Other | Source: Ambulatory Visit | Attending: Cardiology | Admitting: Cardiology

## 2011-06-29 DIAGNOSIS — I2510 Atherosclerotic heart disease of native coronary artery without angina pectoris: Secondary | ICD-10-CM | POA: Diagnosis not present

## 2011-06-29 DIAGNOSIS — Z5189 Encounter for other specified aftercare: Secondary | ICD-10-CM | POA: Diagnosis not present

## 2011-06-29 DIAGNOSIS — E669 Obesity, unspecified: Secondary | ICD-10-CM | POA: Diagnosis not present

## 2011-06-29 DIAGNOSIS — Z87891 Personal history of nicotine dependence: Secondary | ICD-10-CM | POA: Diagnosis not present

## 2011-06-29 DIAGNOSIS — Z7982 Long term (current) use of aspirin: Secondary | ICD-10-CM | POA: Diagnosis not present

## 2011-06-29 DIAGNOSIS — M109 Gout, unspecified: Secondary | ICD-10-CM | POA: Diagnosis not present

## 2011-06-29 DIAGNOSIS — G4733 Obstructive sleep apnea (adult) (pediatric): Secondary | ICD-10-CM | POA: Insufficient documentation

## 2011-06-29 DIAGNOSIS — Z951 Presence of aortocoronary bypass graft: Secondary | ICD-10-CM | POA: Diagnosis not present

## 2011-06-29 DIAGNOSIS — H3530 Unspecified macular degeneration: Secondary | ICD-10-CM | POA: Insufficient documentation

## 2011-07-02 ENCOUNTER — Encounter (HOSPITAL_COMMUNITY)
Admission: RE | Admit: 2011-07-02 | Discharge: 2011-07-02 | Disposition: A | Discharge status: Home / Self Care | Payer: Medicare Other | Source: Ambulatory Visit | Attending: Cardiology | Admitting: Cardiology

## 2011-07-04 ENCOUNTER — Encounter (HOSPITAL_COMMUNITY)
Admission: RE | Admit: 2011-07-04 | Discharge: 2011-07-04 | Disposition: A | Discharge status: Home / Self Care | Payer: Medicare Other | Source: Ambulatory Visit | Attending: Cardiology | Admitting: Cardiology

## 2011-07-06 ENCOUNTER — Encounter (HOSPITAL_COMMUNITY): Payer: Medicare Other

## 2011-07-09 ENCOUNTER — Encounter (HOSPITAL_COMMUNITY)
Admission: RE | Admit: 2011-07-09 | Discharge: 2011-07-09 | Disposition: A | Discharge status: Home / Self Care | Payer: Medicare Other | Source: Ambulatory Visit | Attending: Cardiology | Admitting: Cardiology

## 2011-07-11 ENCOUNTER — Other Ambulatory Visit: Payer: Self-pay | Admitting: *Deleted

## 2011-07-11 ENCOUNTER — Encounter (HOSPITAL_COMMUNITY)
Admission: RE | Admit: 2011-07-11 | Discharge: 2011-07-11 | Disposition: A | Discharge status: Home / Self Care | Payer: Medicare Other | Source: Ambulatory Visit | Attending: Cardiology | Admitting: Cardiology

## 2011-07-11 MED ORDER — METOPROLOL TARTRATE 25 MG PO TABS: 12.5000 mg | ORAL_TABLET | Freq: Two times a day (BID) | ORAL | Status: DC

## 2011-07-13 ENCOUNTER — Encounter (HOSPITAL_COMMUNITY)
Admission: RE | Admit: 2011-07-13 | Discharge: 2011-07-13 | Disposition: A | Discharge status: Home / Self Care | Payer: Medicare Other | Source: Ambulatory Visit | Attending: Cardiology | Admitting: Cardiology

## 2011-07-16 ENCOUNTER — Encounter (HOSPITAL_COMMUNITY)
Admission: RE | Admit: 2011-07-16 | Discharge: 2011-07-16 | Disposition: A | Discharge status: Home / Self Care | Payer: Medicare Other | Source: Ambulatory Visit | Attending: Cardiology | Admitting: Cardiology

## 2011-07-18 ENCOUNTER — Encounter (HOSPITAL_COMMUNITY)
Admission: RE | Admit: 2011-07-18 | Discharge: 2011-07-18 | Disposition: A | Discharge status: Home / Self Care | Payer: Medicare Other | Source: Ambulatory Visit | Attending: Cardiology | Admitting: Cardiology

## 2011-07-20 ENCOUNTER — Encounter (HOSPITAL_COMMUNITY)
Admission: RE | Admit: 2011-07-20 | Discharge: 2011-07-20 | Disposition: A | Discharge status: Home / Self Care | Payer: Medicare Other | Source: Ambulatory Visit | Attending: Cardiology | Admitting: Cardiology

## 2011-07-23 ENCOUNTER — Encounter (HOSPITAL_COMMUNITY)
Admission: RE | Admit: 2011-07-23 | Discharge: 2011-07-23 | Disposition: A | Discharge status: Home / Self Care | Payer: Medicare Other | Source: Ambulatory Visit | Attending: Cardiology | Admitting: Cardiology

## 2011-07-25 ENCOUNTER — Encounter (HOSPITAL_COMMUNITY)
Admission: RE | Admit: 2011-07-25 | Discharge: 2011-07-25 | Disposition: A | Discharge status: Home / Self Care | Payer: Medicare Other | Source: Ambulatory Visit | Attending: Cardiology | Admitting: Cardiology

## 2011-07-27 ENCOUNTER — Encounter (HOSPITAL_COMMUNITY)
Admission: RE | Admit: 2011-07-27 | Discharge: 2011-07-27 | Disposition: A | Discharge status: Home / Self Care | Payer: Medicare Other | Source: Ambulatory Visit | Attending: Cardiology | Admitting: Cardiology

## 2011-07-27 DIAGNOSIS — G4733 Obstructive sleep apnea (adult) (pediatric): Secondary | ICD-10-CM | POA: Diagnosis not present

## 2011-07-27 DIAGNOSIS — Z7982 Long term (current) use of aspirin: Secondary | ICD-10-CM | POA: Diagnosis not present

## 2011-07-27 DIAGNOSIS — Z951 Presence of aortocoronary bypass graft: Secondary | ICD-10-CM | POA: Insufficient documentation

## 2011-07-27 DIAGNOSIS — M109 Gout, unspecified: Secondary | ICD-10-CM | POA: Diagnosis not present

## 2011-07-27 DIAGNOSIS — I2510 Atherosclerotic heart disease of native coronary artery without angina pectoris: Secondary | ICD-10-CM | POA: Insufficient documentation

## 2011-07-27 DIAGNOSIS — Z5189 Encounter for other specified aftercare: Secondary | ICD-10-CM | POA: Diagnosis not present

## 2011-07-27 DIAGNOSIS — Z87891 Personal history of nicotine dependence: Secondary | ICD-10-CM | POA: Insufficient documentation

## 2011-07-27 DIAGNOSIS — H3530 Unspecified macular degeneration: Secondary | ICD-10-CM | POA: Diagnosis not present

## 2011-07-27 DIAGNOSIS — E669 Obesity, unspecified: Secondary | ICD-10-CM | POA: Insufficient documentation

## 2011-07-27 NOTE — Progress Notes (Signed)
Pt graduated with the completion of 36 exercise session.  Pt plans to continue his home exercise with walking and local [employer].

## 2011-07-30 ENCOUNTER — Encounter (HOSPITAL_COMMUNITY): Payer: Medicare Other

## 2011-08-01 ENCOUNTER — Encounter (HOSPITAL_COMMUNITY): Payer: Medicare Other

## 2011-08-01 NOTE — Progress Notes (Addendum)
Cardiac Rehabilitation Program Progress Report   Orientation:  04/12/2011  Graduate Date:  07/27/2011  # of sessions completed: 36/36  Cardiologist: Swaziland Family MD:  Talmage Coin Time:  1115  A.  Exercise Program:  Tolerates exercise @ 5.9 METS for 30 minutes, Bike Test Results:  Pre: 0.89 mile and Post: 1.26 miles, Improved functional capacity  41.6 %, Improved  muscular strength  27.5 %, Improved  flexibility 2.9 %, Improved education score 4 % and Discharged to home exercise program.  Anticipated compliance:  good  B.  Mental Health:  Good mental attitude and Quality of Life (QOL)  improvements:  Overall  11.7 %, Health/Functioning 20.8 %, Socioeconomics 4.1 %, Psych/Spiritual 8.5 %, Family 5.3 %    C.  Education/Instruction/Skills  Accurately checks own pulse.  Rest:  77  Exercise:  123, Knows THR for exercise, Uses Perceived Exertion Scale and/or Dyspnea Scale and Attended 12/13 education classes  Home exercise given: 04/25/2011  D.  Nutrition/Weight Control/Body Composition:  Adherence to prescribed nutrition program: good , Patient has lost 1.0 kg, BMI 27.9, 26.6% Body Fat and Evidence of fat weight loss 20.4%  *This section completed by Mickle Plumb, Andres Shad, RD, LDN, CDE  E.  Blood Lipids    Lab Results  Component Value Date   CHOL 185 03/20/2011     Lab Results  Component Value Date   TRIG 189.0* 03/20/2011     Lab Results  Component Value Date   HDL 51.50 03/20/2011     Lab Results  Component Value Date   CHOLHDL 4 03/20/2011     No results found for this basename: LDLDIRECT      F.  Lifestyle Changes:  Making positive lifestyle changes  G.  Symptoms noted with exercise:  Asymptomatic  Report Completed By:  Hazle Nordmann   Comments:  Pt did very well in program progressing from 2.5 METs to 5.9 METs.  He plans to continue to exercise by joining his wife at the [facility].  At d/c he was in sinus rhythm.  Thanks for the  referral. Fabio Pierce, MA, ACSM RCEP       Agree with the above assessment.  Pt had zero hospitalizations during his participation 04/18/11 - 07/27/11

## 2011-08-03 ENCOUNTER — Encounter (HOSPITAL_COMMUNITY): Payer: Medicare Other

## 2011-08-06 ENCOUNTER — Encounter (HOSPITAL_COMMUNITY): Payer: Medicare Other

## 2011-08-08 ENCOUNTER — Encounter (HOSPITAL_COMMUNITY): Payer: Medicare Other

## 2011-08-10 ENCOUNTER — Encounter (HOSPITAL_COMMUNITY): Payer: Medicare Other

## 2011-08-13 ENCOUNTER — Encounter (HOSPITAL_COMMUNITY): Payer: Medicare Other

## 2011-08-15 ENCOUNTER — Encounter (HOSPITAL_COMMUNITY): Payer: Medicare Other

## 2011-08-16 ENCOUNTER — Encounter: Payer: Self-pay | Admitting: Cardiology

## 2011-08-16 ENCOUNTER — Ambulatory Visit (INDEPENDENT_AMBULATORY_CARE_PROVIDER_SITE_OTHER): Payer: Medicare Other | Admitting: Cardiology

## 2011-08-16 VITALS — BP 104/60 | HR 54 | Ht 73.0 in | Wt 211.0 lb

## 2011-08-16 DIAGNOSIS — I2510 Atherosclerotic heart disease of native coronary artery without angina pectoris: Secondary | ICD-10-CM | POA: Diagnosis not present

## 2011-08-16 DIAGNOSIS — E7800 Pure hypercholesterolemia, unspecified: Secondary | ICD-10-CM

## 2011-08-16 DIAGNOSIS — E785 Hyperlipidemia, unspecified: Secondary | ICD-10-CM

## 2011-08-16 DIAGNOSIS — Z951 Presence of aortocoronary bypass graft: Secondary | ICD-10-CM

## 2011-08-16 MED ORDER — SIMVASTATIN 20 MG PO TABS: 20.0000 mg | ORAL_TABLET | Freq: Every evening | ORAL | Status: DC

## 2011-08-16 MED ORDER — METOPROLOL TARTRATE 25 MG PO TABS: 12.5000 mg | ORAL_TABLET | Freq: Two times a day (BID) | ORAL | Status: DC

## 2011-08-16 NOTE — Progress Notes (Signed)
Jason Tate Date of Birth: Oct 01, 1945 Medical Record #[id number]  History of Present Illness: Jason Tate is seen in the for status post CABG in October.  He is doing very well. He denies any chest pain or shortness of breath. He has been very diligent with his exercise and diet. As a result he has lost about 15 pounds compared to his preoperative weight. His waist size has decreased. He feels more energetic. His Celexa dose was reduced when he was in the hospital but he feels a little more dysthymic now and wants to go back up to 40 mg per day. He also wants to okay to go back on AndroGel.  Current Outpatient Prescriptions on File Prior to Visit  Medication Sig Dispense Refill  . allopurinol (ZYLOPRIM) 300 MG tablet Take 300 mg by mouth daily.        Marland Kitchen aspirin 325 MG tablet Take 325 mg by mouth daily.        . B Complex Vitamins (VITAMIN B COMPLEX PO) Take by mouth 2 (two) times daily.       Marland Kitchen buPROPion (WELLBUTRIN XL) 300 MG 24 hr tablet Take 300 mg by mouth daily.        . Cholecalciferol (VITAMIN D PO) Take 4,000 mg by mouth daily.        . citalopram (CELEXA) 40 MG tablet Take 20 mg by mouth daily.       . clonazePAM (KLONOPIN) 1 MG tablet Take 1 mg by mouth at bedtime.        . Multiple Vitamins-Minerals (PRESERVISION/LUTEIN PO) Take by mouth 3 (three) times daily.        . nabumetone (RELAFEN) 750 MG tablet Take 750 mg by mouth 2 (two) times daily.        . Omega-3 Fatty Acids (FISH OIL PO) Take by mouth.        Marland Kitchen omeprazole (PRILOSEC) 20 MG capsule Take 20 mg by mouth daily.        . vardenafil (LEVITRA) 20 MG tablet Take 20 mg by mouth daily as needed.        Marland Kitchen DISCONTD: simvastatin (ZOCOR) 20 MG tablet Take 20 mg by mouth at bedtime.          No Known Allergies  Past Medical History  Diagnosis Date  . Sleep apnea, obstructive     on BiPAP  . Hypercholesterolemia   . Low testosterone   . PVC (premature ventricular contraction)   . Macular degeneration   . Obesity   .  Abnormal stress echo Oct 2012  . CAD (coronary artery disease) October 2012    s/p CABG x 2 per Dr. Cornelius Moras    Past Surgical History  Procedure Date  . Coronary artery bypass graft Oct 2012    LIMA to LAD and left radial to OM    History  Smoking status  . Former Smoker  Smokeless tobacco  . Not on file    History  Alcohol Use  . Yes    Family History  Problem Relation Age of Onset  . Heart disease Mother   . Stroke Father   . Heart disease Father     Review of Systems: The review of systems is per the HPI.  All other systems were reviewed and are negative.  Physical Exam: BP 104/60  Pulse 54  Ht 6\' 1"  (1.854 m)  Wt 95.709 kg (211 lb)  BMI 27.84 kg/m2 Patient is very pleasant and in no acute  distress. Skin is warm and dry. Color is normal.  HEENT is unremarkable. Normocephalic/atraumatic. PERRL. Sclera are nonicteric. Neck is supple. No masses. No JVD. Lungs are clear. Cardiac exam shows a regular rate and rhythm. Sternum incision has healed well. Does have keloid over left forearm from radial harvesting. Abdomen is soft. Extremities are without edema. Gait and ROM are intact. No gross neurologic deficits noted.   LABORATORY DATA: ECG today demonstrates normal sinus rhythm with anterior infarct age undetermined. He essentially has poor weight progression from the one through V4. He had a lipid panel in December which showed a total cholesterol of 144. LDL was 65 and HDL was 41.  Assessment / Plan:

## 2011-08-16 NOTE — Assessment & Plan Note (Signed)
His lipids are at goal with exception of a low HDL. I expect this will improve with his significant dietary changes and exercise. We will continue on his current therapy of fish oil and Zocor.

## 2011-08-16 NOTE — Patient Instructions (Signed)
Continue your current medication.  Keep up with your exercise and dietary modifications.  I will see you again in 6 months.

## 2011-08-16 NOTE — Assessment & Plan Note (Signed)
He is status post CABG in October of 2012. He has made an excellent recovery. We need to focus on long-term lifestyle modification and risk reduction. I will plan a followup again in 6 months.

## 2011-08-17 ENCOUNTER — Encounter (HOSPITAL_COMMUNITY): Payer: Medicare Other

## 2011-08-20 DIAGNOSIS — H3530 Unspecified macular degeneration: Secondary | ICD-10-CM | POA: Diagnosis not present

## 2011-08-20 DIAGNOSIS — E7800 Pure hypercholesterolemia, unspecified: Secondary | ICD-10-CM | POA: Diagnosis not present

## 2011-08-20 DIAGNOSIS — H5200 Hypermetropia, unspecified eye: Secondary | ICD-10-CM | POA: Diagnosis not present

## 2011-08-20 DIAGNOSIS — H2510 Age-related nuclear cataract, unspecified eye: Secondary | ICD-10-CM | POA: Diagnosis not present

## 2011-08-20 DIAGNOSIS — E291 Testicular hypofunction: Secondary | ICD-10-CM | POA: Diagnosis not present

## 2011-08-20 DIAGNOSIS — I2510 Atherosclerotic heart disease of native coronary artery without angina pectoris: Secondary | ICD-10-CM | POA: Diagnosis not present

## 2011-08-20 DIAGNOSIS — I10 Essential (primary) hypertension: Secondary | ICD-10-CM | POA: Diagnosis not present

## 2011-08-22 DIAGNOSIS — Z79899 Other long term (current) drug therapy: Secondary | ICD-10-CM | POA: Diagnosis not present

## 2011-08-22 DIAGNOSIS — E291 Testicular hypofunction: Secondary | ICD-10-CM | POA: Diagnosis not present

## 2011-08-22 DIAGNOSIS — E785 Hyperlipidemia, unspecified: Secondary | ICD-10-CM | POA: Diagnosis not present

## 2011-09-10 DIAGNOSIS — F331 Major depressive disorder, recurrent, moderate: Secondary | ICD-10-CM | POA: Diagnosis not present

## 2011-12-17 ENCOUNTER — Telehealth: Payer: Self-pay | Admitting: Cardiology

## 2011-12-17 NOTE — Telephone Encounter (Signed)
Patient called stated he has been having chest tightness off and on since last Wednesday  12/12/11.States chest feels sore like muscle pain.Also states he exercises at [facility] and does not have any tightness.[facility] beat seem to be beating faster than normally when he walks,110,115 beats/min while walking.Stated he is going on vacation next week,has appointment with his PCP tomorrow 7/23//13 and he will let PCP evaluate and call back if he thinks this is cardiac related.

## 2011-12-17 NOTE — Telephone Encounter (Signed)
Pt calling re extra heartbeats, pls advise [phone]

## 2011-12-18 ENCOUNTER — Ambulatory Visit: Payer: Self-pay | Admitting: Family Medicine

## 2011-12-18 DIAGNOSIS — R0989 Other specified symptoms and signs involving the circulatory and respiratory systems: Secondary | ICD-10-CM | POA: Diagnosis not present

## 2011-12-18 DIAGNOSIS — E291 Testicular hypofunction: Secondary | ICD-10-CM | POA: Diagnosis not present

## 2011-12-18 DIAGNOSIS — Z125 Encounter for screening for malignant neoplasm of prostate: Secondary | ICD-10-CM | POA: Diagnosis not present

## 2011-12-18 DIAGNOSIS — R079 Chest pain, unspecified: Secondary | ICD-10-CM | POA: Diagnosis not present

## 2011-12-18 DIAGNOSIS — I2510 Atherosclerotic heart disease of native coronary artery without angina pectoris: Secondary | ICD-10-CM | POA: Diagnosis not present

## 2011-12-18 DIAGNOSIS — R0789 Other chest pain: Secondary | ICD-10-CM | POA: Diagnosis not present

## 2011-12-18 NOTE — Telephone Encounter (Signed)
Patient called stated he saw PCP Dr.Richard Sullivan Lone today 12/18/11.Stated Dr.Gilbert did not think chest tightness is cardiac related.States he had a ekg and it was normal.CXR was also done.Patient was told will let Dr.Jordan know.Advised to call back if needed.

## 2011-12-18 NOTE — Telephone Encounter (Signed)
New problem    Patient returning call back to nurse.   

## 2011-12-19 DIAGNOSIS — F331 Major depressive disorder, recurrent, moderate: Secondary | ICD-10-CM | POA: Diagnosis not present

## 2012-02-21 DIAGNOSIS — Z23 Encounter for immunization: Secondary | ICD-10-CM | POA: Diagnosis not present

## 2012-02-26 DIAGNOSIS — F331 Major depressive disorder, recurrent, moderate: Secondary | ICD-10-CM | POA: Diagnosis not present

## 2012-03-26 DIAGNOSIS — H35369 Drusen (degenerative) of macula, unspecified eye: Secondary | ICD-10-CM | POA: Diagnosis not present

## 2012-03-26 DIAGNOSIS — H3554 Dystrophies primarily involving the retinal pigment epithelium: Secondary | ICD-10-CM | POA: Diagnosis not present

## 2012-03-27 DIAGNOSIS — H353190 Nonexudative age-related macular degeneration, unspecified eye, stage unspecified: Secondary | ICD-10-CM | POA: Diagnosis not present

## 2012-03-27 DIAGNOSIS — H5340 Unspecified visual field defects: Secondary | ICD-10-CM | POA: Diagnosis not present

## 2012-03-27 DIAGNOSIS — H3554 Dystrophies primarily involving the retinal pigment epithelium: Secondary | ICD-10-CM | POA: Diagnosis not present

## 2012-03-31 DIAGNOSIS — F331 Major depressive disorder, recurrent, moderate: Secondary | ICD-10-CM | POA: Diagnosis not present

## 2012-04-04 ENCOUNTER — Ambulatory Visit (INDEPENDENT_AMBULATORY_CARE_PROVIDER_SITE_OTHER): Payer: Medicare Other | Admitting: Cardiology

## 2012-04-04 ENCOUNTER — Encounter: Payer: Self-pay | Admitting: Cardiology

## 2012-04-04 VITALS — BP 112/66 | HR 85 | Ht 73.0 in | Wt 198.8 lb

## 2012-04-04 DIAGNOSIS — E7800 Pure hypercholesterolemia, unspecified: Secondary | ICD-10-CM | POA: Diagnosis not present

## 2012-04-04 DIAGNOSIS — Z951 Presence of aortocoronary bypass graft: Secondary | ICD-10-CM | POA: Diagnosis not present

## 2012-04-04 DIAGNOSIS — I2510 Atherosclerotic heart disease of native coronary artery without angina pectoris: Secondary | ICD-10-CM

## 2012-04-04 DIAGNOSIS — I441 Atrioventricular block, second degree: Secondary | ICD-10-CM

## 2012-04-04 HISTORY — DX: Atrioventricular block, second degree: I44.1

## 2012-04-04 NOTE — Patient Instructions (Signed)
Stop taking metoprolol.  Continue your other medication  Send me a copy of your lab work  I will see you again in 6 months.

## 2012-04-04 NOTE — Progress Notes (Signed)
Jason Tate Date of Birth: [birth year] Medical Record #[id number]  History of Present Illness: Jason Tate is seen in the for status post CABG in October 2012.  He is doing very well. He exercises daily with 30 minutes of walking. He has lost 13 pounds. He is following a high fiber diet. He's noticed some very vague chest tightness and discomfort underneath his shoulder blade. He thinks this is still related to his surgery. He denies any dizziness or syncope. He's had some mild palpitations. He denies any shortness of breath.   Current Outpatient Prescriptions on File Prior to Visit  Medication Sig Dispense Refill  . allopurinol (ZYLOPRIM) 300 MG tablet Take 300 mg by mouth daily.        Marland Kitchen aspirin 325 MG tablet Take 325 mg by mouth daily.        . B Complex Vitamins (VITAMIN B COMPLEX PO) Take by mouth 2 (two) times daily.       Marland Kitchen buPROPion (WELLBUTRIN XL) 300 MG 24 hr tablet Take 300 mg by mouth daily.        . Cetirizine HCl (ZYRTEC ALLERGY PO) Take by mouth daily.      . Cholecalciferol (VITAMIN D PO) Take 4,000 mg by mouth daily.        . citalopram (CELEXA) 40 MG tablet Take 20 mg by mouth daily.       . clonazePAM (KLONOPIN) 1 MG tablet Take 1 mg by mouth at bedtime.        . Flaxseed, Linseed, (FLAX SEED OIL PO) Take by mouth daily.      . Misc Natural Products (OSTEO BI-FLEX JOINT SHIELD PO) Take by mouth daily.      . Multiple Vitamins-Minerals (PRESERVISION/LUTEIN PO) Take by mouth 3 (three) times daily.        . nabumetone (RELAFEN) 750 MG tablet Take 750 mg by mouth 2 (two) times daily.        . Omega-3 Fatty Acids (FISH OIL PO) Take by mouth.        Marland Kitchen omeprazole (PRILOSEC) 20 MG capsule Take 20 mg by mouth daily.        . simvastatin (ZOCOR) 20 MG tablet Take 1 tablet (20 mg total) by mouth every evening.  90 tablet  3  . vardenafil (LEVITRA) 20 MG tablet Take 20 mg by mouth daily as needed.          No Known Allergies  Past Medical History  Diagnosis Date  . Sleep  apnea, obstructive     on BiPAP  . Hypercholesterolemia   . Low testosterone   . PVC (premature ventricular contraction)   . Macular degeneration   . Obesity   . Abnormal stress echo Oct 2012  . CAD (coronary artery disease) October 2012    s/p CABG x 2 per Dr. Cornelius Moras  . Mobitz type 1 second degree atrioventricular block 04/04/2012    Past Surgical History  Procedure Date  . Coronary artery bypass graft Oct 2012    LIMA to LAD and left radial to OM    History  Smoking status  . Former Smoker  Smokeless tobacco  . Not on file    History  Alcohol Use  . Yes    Family History  Problem Relation Age of Onset  . Heart disease Mother   . Stroke Father   . Heart disease Father     Review of Systems: The review of systems is per the HPI.  All  other systems were reviewed and are negative.  Physical Exam: BP 112/66  Pulse 85  Ht 6\' 1"  (1.854 m)  Wt 198 lb 12.8 oz (90.175 kg)  BMI 26.23 kg/m2  SpO2 97% Patient is very pleasant and in no acute distress. Skin is warm and dry. Color is normal.  HEENT is unremarkable. Normocephalic/atraumatic. PERRL. Sclera are nonicteric. Neck is supple. No masses. No JVD. Lungs are clear. Cardiac exam shows an irregular rate and rhythm. Sternum incision has healed well. Does have keloid over left forearm from radial harvesting. Abdomen is soft. Extremities are without edema. Gait and ROM are intact. No gross neurologic deficits noted.   LABORATORY DATA: ECG today demonstrates normal sinus rhythm with Mobitz type I second-degree AV block. Rate is 54. 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  Assessment / Plan: 1. Coronary disease status post CABG in October 2012. Patient is doing very well. He has made significant lifestyle modifications.  2. Mobitz type 1 second degree AV block. Asymptomatic. We will discontinue his metoprolol.  3. Hyperlipidemia. Continue Zocor and fish oil.

## 2012-04-07 ENCOUNTER — Telehealth: Payer: Self-pay | Admitting: Cardiology

## 2012-04-07 DIAGNOSIS — Z125 Encounter for screening for malignant neoplasm of prostate: Secondary | ICD-10-CM | POA: Diagnosis not present

## 2012-04-07 DIAGNOSIS — Z0000 Encounter for general adult medical examination without abnormal findings: Secondary | ICD-10-CM | POA: Diagnosis not present

## 2012-04-07 DIAGNOSIS — E291 Testicular hypofunction: Secondary | ICD-10-CM | POA: Diagnosis not present

## 2012-04-07 DIAGNOSIS — I2510 Atherosclerotic heart disease of native coronary artery without angina pectoris: Secondary | ICD-10-CM | POA: Diagnosis not present

## 2012-04-07 DIAGNOSIS — E7800 Pure hypercholesterolemia, unspecified: Secondary | ICD-10-CM

## 2012-04-07 NOTE — Telephone Encounter (Signed)
New problem:   Need to have lab work done at office verse his PCP.

## 2012-04-07 NOTE — Telephone Encounter (Signed)
Spoke to patient stated he wanted to have fasting labs here.States he will come tomorrow 04/08/12.Advised to come fasting, lab starts at 7:30 am.

## 2012-04-08 ENCOUNTER — Other Ambulatory Visit (INDEPENDENT_AMBULATORY_CARE_PROVIDER_SITE_OTHER): Payer: Medicare Other

## 2012-04-08 DIAGNOSIS — E7800 Pure hypercholesterolemia, unspecified: Secondary | ICD-10-CM | POA: Diagnosis not present

## 2012-04-08 LAB — BASIC METABOLIC PANEL
BUN: 16 mg/dL (ref 6–23)
CO2: 27 mEq/L (ref 19–32)
Calcium: 9.2 mg/dL (ref 8.4–10.5)
Chloride: 103 mEq/L (ref 96–112)
Creatinine, Ser: 1.1 mg/dL (ref 0.4–1.5)
GFR: 72.28 mL/min (ref >60.00)
Glucose, Bld: 108 mg/dL — ABNORMAL HIGH (ref 70–99)
Potassium: 5 mEq/L (ref 3.5–5.1)
Sodium: 139 mEq/L (ref 135–145)

## 2012-04-08 LAB — HEPATIC FUNCTION PANEL
ALT: 21 U/L (ref 0–53)
AST: 21 U/L (ref 0–37)
Albumin: 4.1 g/dL (ref 3.5–5.2)
Alkaline Phosphatase: 56 U/L (ref 39–117)
Bilirubin, Direct: 0.1 mg/dL (ref 0.0–0.3)
Total Bilirubin: 0.7 mg/dL (ref 0.3–1.2)
Total Protein: 6.8 g/dL (ref 6.0–8.3)

## 2012-04-08 LAB — LIPID PANEL
Cholesterol: 120 mg/dL (ref 0–200)
HDL: 52.1 mg/dL (ref >39.00)
LDL Cholesterol: 55 mg/dL (ref 0–99)
Total CHOL/HDL Ratio: 2
Triglycerides: 67 mg/dL (ref 0.0–149.0)
VLDL: 13.4 mg/dL (ref 0.0–40.0)

## 2012-06-02 DIAGNOSIS — F331 Major depressive disorder, recurrent, moderate: Secondary | ICD-10-CM | POA: Diagnosis not present

## 2012-06-03 DIAGNOSIS — I10 Essential (primary) hypertension: Secondary | ICD-10-CM | POA: Diagnosis not present

## 2012-06-03 DIAGNOSIS — G4700 Insomnia, unspecified: Secondary | ICD-10-CM | POA: Diagnosis not present

## 2012-06-03 DIAGNOSIS — Z23 Encounter for immunization: Secondary | ICD-10-CM | POA: Diagnosis not present

## 2012-06-03 DIAGNOSIS — L259 Unspecified contact dermatitis, unspecified cause: Secondary | ICD-10-CM | POA: Diagnosis not present

## 2012-07-09 ENCOUNTER — Other Ambulatory Visit: Payer: Self-pay | Admitting: Cardiology

## 2012-07-12 ENCOUNTER — Other Ambulatory Visit: Payer: Self-pay

## 2012-07-19 ENCOUNTER — Other Ambulatory Visit: Payer: Self-pay | Admitting: Cardiology

## 2012-08-20 DIAGNOSIS — H5200 Hypermetropia, unspecified eye: Secondary | ICD-10-CM | POA: Diagnosis not present

## 2012-08-20 DIAGNOSIS — H3530 Unspecified macular degeneration: Secondary | ICD-10-CM | POA: Diagnosis not present

## 2012-08-20 DIAGNOSIS — H2510 Age-related nuclear cataract, unspecified eye: Secondary | ICD-10-CM | POA: Diagnosis not present

## 2012-09-29 DIAGNOSIS — F331 Major depressive disorder, recurrent, moderate: Secondary | ICD-10-CM | POA: Diagnosis not present

## 2012-10-07 ENCOUNTER — Ambulatory Visit (INDEPENDENT_AMBULATORY_CARE_PROVIDER_SITE_OTHER): Payer: Medicare Other | Admitting: Cardiology

## 2012-10-07 ENCOUNTER — Encounter: Payer: Self-pay | Admitting: Cardiology

## 2012-10-07 VITALS — BP 110/58 | HR 61 | Ht 73.0 in | Wt 207.4 lb

## 2012-10-07 DIAGNOSIS — E7800 Pure hypercholesterolemia, unspecified: Secondary | ICD-10-CM | POA: Diagnosis not present

## 2012-10-07 DIAGNOSIS — Z951 Presence of aortocoronary bypass graft: Secondary | ICD-10-CM

## 2012-10-07 DIAGNOSIS — I2510 Atherosclerotic heart disease of native coronary artery without angina pectoris: Secondary | ICD-10-CM

## 2012-10-07 DIAGNOSIS — I441 Atrioventricular block, second degree: Secondary | ICD-10-CM

## 2012-10-07 NOTE — Patient Instructions (Signed)
Continue your current therapy and try and lose weight.  I will see you in 6 months with fasting lab work.

## 2012-10-07 NOTE — Progress Notes (Signed)
Jason Tate Date of Birth: [birth year] Medical Record #[id number]  History of Present Illness: Jason Tate is seen in the for status post CABG in October 2012.  He is doing very well. He still notes some mild chest soreness from his surgery. He denies any anginal symptoms, shortness of breath, or palpitations. He is exercising regularly. He he did go on a river cruise this spring and gained 9 pounds.   Current Outpatient Prescriptions on File Prior to Visit  Medication Sig Dispense Refill  . allopurinol (ZYLOPRIM) 300 MG tablet Take 300 mg by mouth daily.        Marland Kitchen aspirin 325 MG tablet Take 325 mg by mouth daily.        . B Complex Vitamins (VITAMIN B COMPLEX PO) Take by mouth 2 (two) times daily.       Marland Kitchen buPROPion (WELLBUTRIN XL) 300 MG 24 hr tablet Take 300 mg by mouth daily.        . Cetirizine HCl (ZYRTEC ALLERGY PO) Take by mouth daily.      . Cholecalciferol (VITAMIN D PO) Take 4,000 mg by mouth daily.        . citalopram (CELEXA) 40 MG tablet Take 20 mg by mouth daily.       . clonazePAM (KLONOPIN) 1 MG tablet Take 1 mg by mouth at bedtime.        . Flaxseed, Linseed, (FLAX SEED OIL PO) Take by mouth daily.      . Misc Natural Products (OSTEO BI-FLEX JOINT SHIELD PO) Take by mouth daily.      . Multiple Vitamins-Minerals (PRESERVISION/LUTEIN PO) Take by mouth 3 (three) times daily.        . nabumetone (RELAFEN) 750 MG tablet Take 750 mg by mouth 2 (two) times daily.        . Omega-3 Fatty Acids (FISH OIL PO) Take by mouth.        Marland Kitchen omeprazole (PRILOSEC) 20 MG capsule Take 20 mg by mouth daily.        . vardenafil (LEVITRA) 20 MG tablet Take 20 mg by mouth daily as needed.        Marland Kitchen ZOCOR 20 MG tablet TAKE 1 TABLET EVERY EVENING  90 tablet  1   No current facility-administered medications on file prior to visit.    No Known Allergies  Past Medical History  Diagnosis Date  . Sleep apnea, obstructive     on BiPAP  . Hypercholesterolemia   . Low testosterone   . PVC  (premature ventricular contraction)   . Macular degeneration   . Obesity   . Abnormal stress echo Oct 2012  . CAD (coronary artery disease) October 2012    s/p CABG x 2 per Dr. Cornelius Moras  . Mobitz type 1 second degree atrioventricular block 04/04/2012    Past Surgical History  Procedure Laterality Date  . Coronary artery bypass graft  Oct 2012    LIMA to LAD and left radial to OM    History  Smoking status  . Former Smoker  Smokeless tobacco  . Not on file    History  Alcohol Use  . Yes    Family History  Problem Relation Age of Onset  . Heart disease Mother   . Stroke Father   . Heart disease Father     Review of Systems: The review of systems is per the HPI.  All other systems were reviewed and are negative.  Physical Exam: BP 110/58  Pulse  61  Ht 6\' 1"  (1.854 m)  Wt 207 lb 6.4 oz (94.076 kg)  BMI 27.37 kg/m2  SpO2 99% Patient is very pleasant and in no acute distress. Skin is warm and dry. Color is normal.  HEENT is unremarkable. Normocephalic/atraumatic. PERRL. Sclera are nonicteric. Neck is supple. No masses. No JVD. Lungs are clear. Cardiac exam shows an irregular rate and rhythm. Sternum incision has healed well.  Abdomen is soft. Extremities are without edema. Gait and ROM are intact. No gross neurologic deficits noted.   LABORATORY DATA: Lab Results  Component Value Date   WBC 10.3 04/09/2011   HGB 9.8* 04/09/2011   HCT 29.1* 04/09/2011   PLT 409.0* 04/09/2011   GLUCOSE 108* 04/08/2012   CHOL 120 04/08/2012   TRIG 67.0 04/08/2012   HDL 52.10 04/08/2012   LDLCALC 55 04/08/2012   ALT 21 04/08/2012   AST 21 04/08/2012   NA 139 04/08/2012   K 5.0 04/08/2012   CL 103 04/08/2012   CREATININE 1.1 04/08/2012   BUN 16 04/08/2012   CO2 27 04/08/2012   INR 1.26 03/23/2011   HGBA1C 5.2 03/22/2011     Assessment / Plan: 1. Coronary disease status post CABG in October 2012. Patient is doing very well. He has made significant lifestyle modifications.  Continue his current exercise regimen and attempt to lose weight.  2. Mobitz type 1 second degree AV block. Asymptomatic. Metoprolol discontinued on his last visit.  3. Hyperlipidemia. Continue Zocor and fish oil. We'll check fasting lab work in 6 months.

## 2012-12-29 ENCOUNTER — Other Ambulatory Visit: Payer: Self-pay | Admitting: Cardiology

## 2012-12-29 DIAGNOSIS — F331 Major depressive disorder, recurrent, moderate: Secondary | ICD-10-CM | POA: Diagnosis not present

## 2012-12-29 IMAGING — CR DG CHEST 2V
1 series · 3 of 3 positions shown · non-contrast
Comparison: none

REASON FOR EXAM: chest pain congestion  chest tightness
COMMENTS:

[Series 1: pa · 0.17mm/px · 3 of 3 slices shown]
[im 1/3]
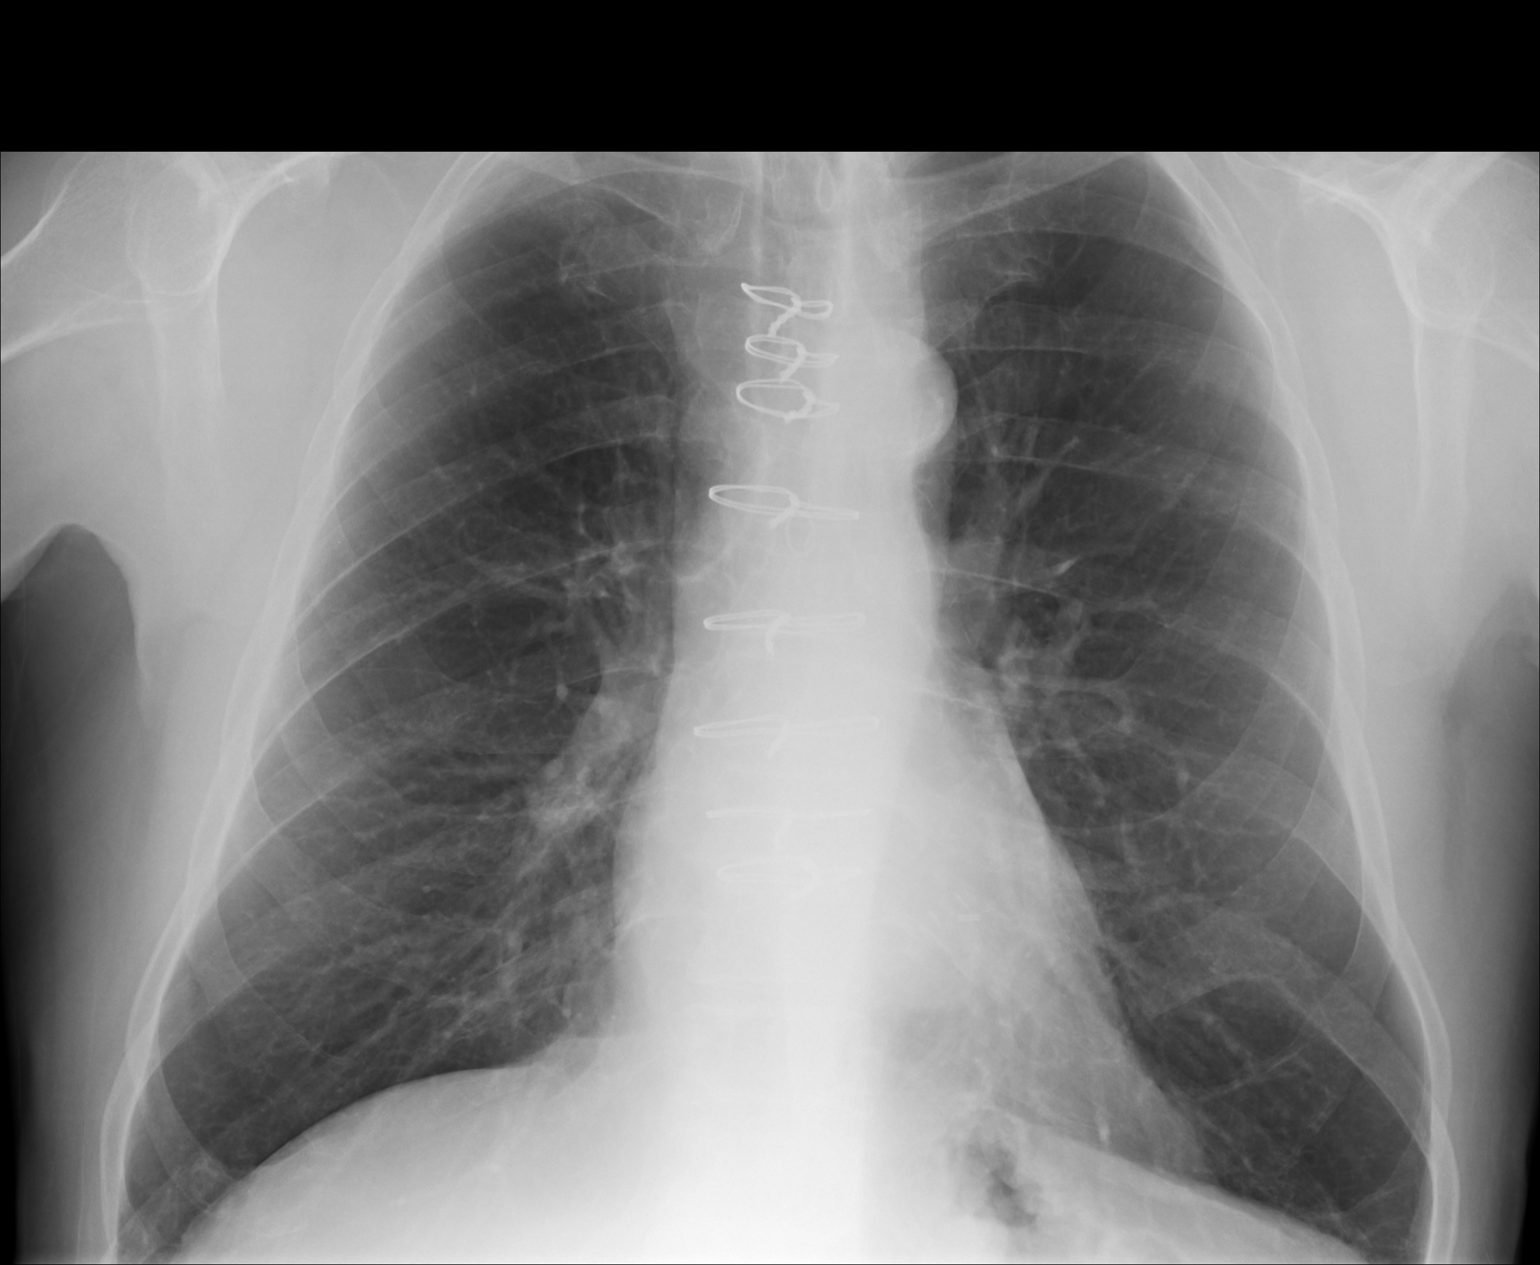
[im 2/3]
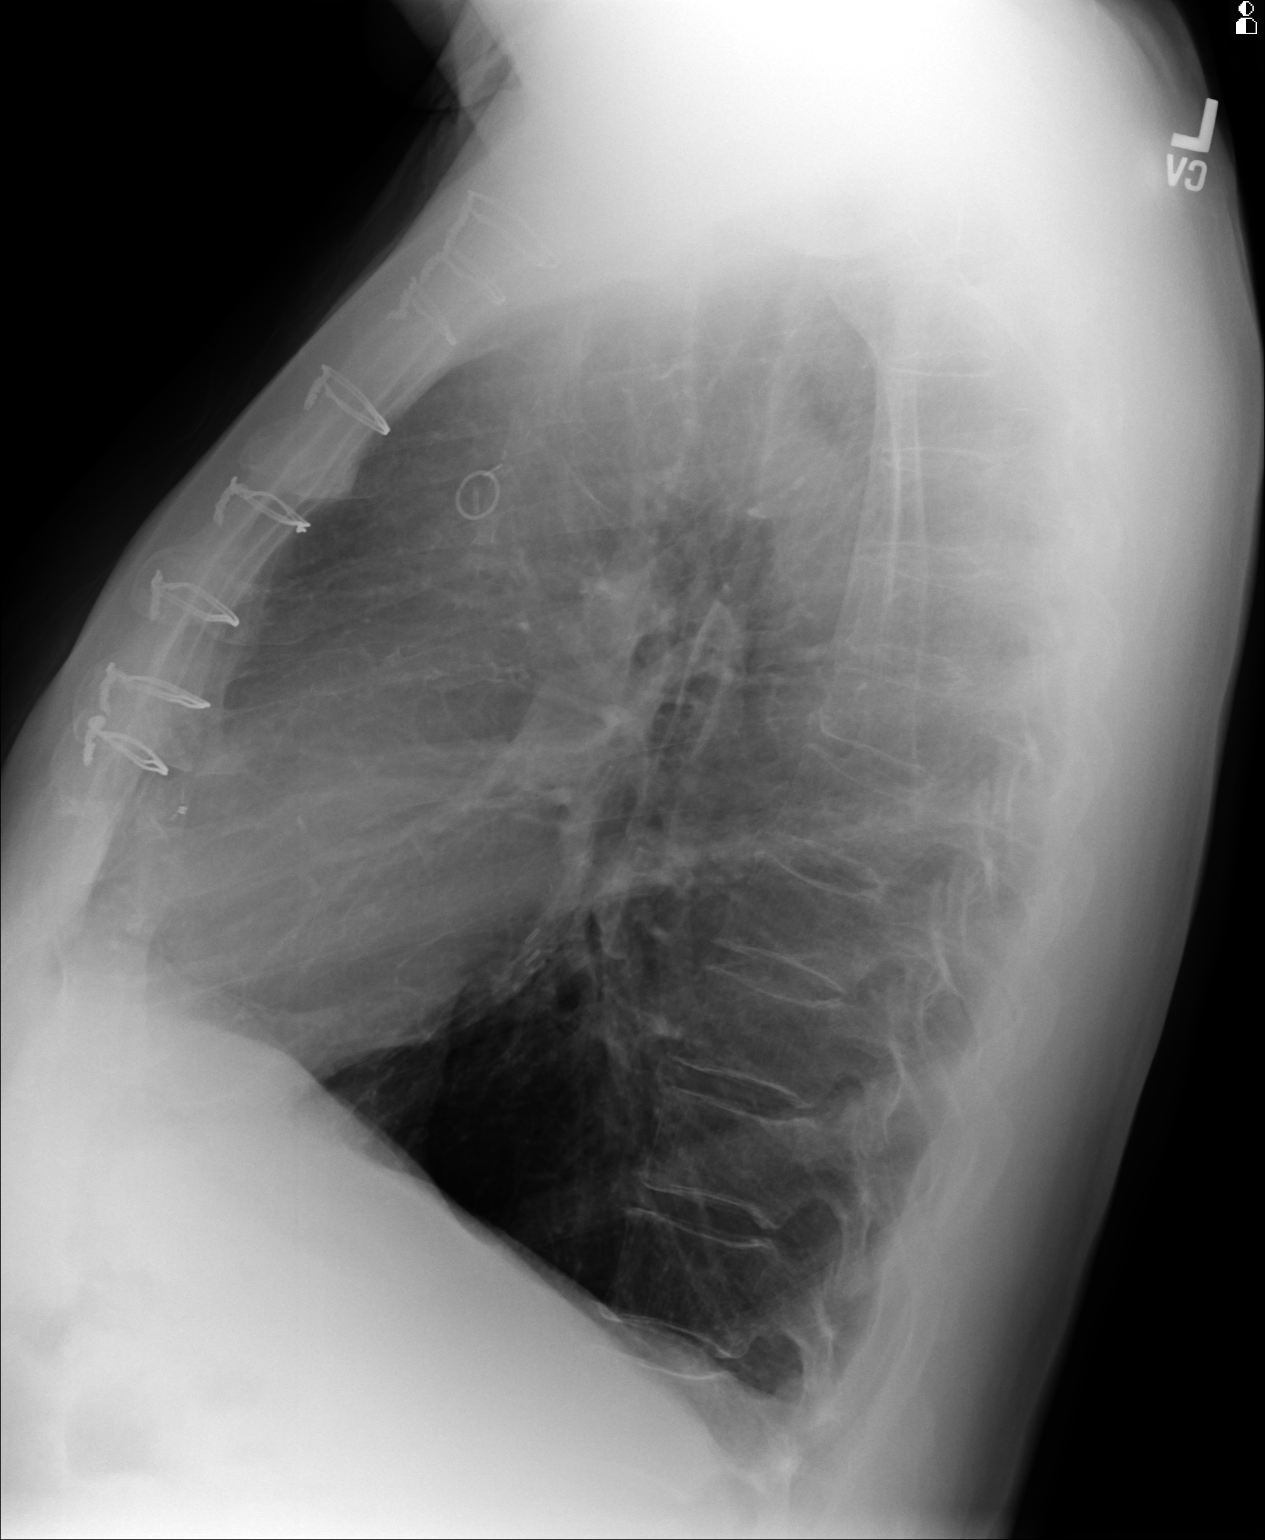
[im 3/3]
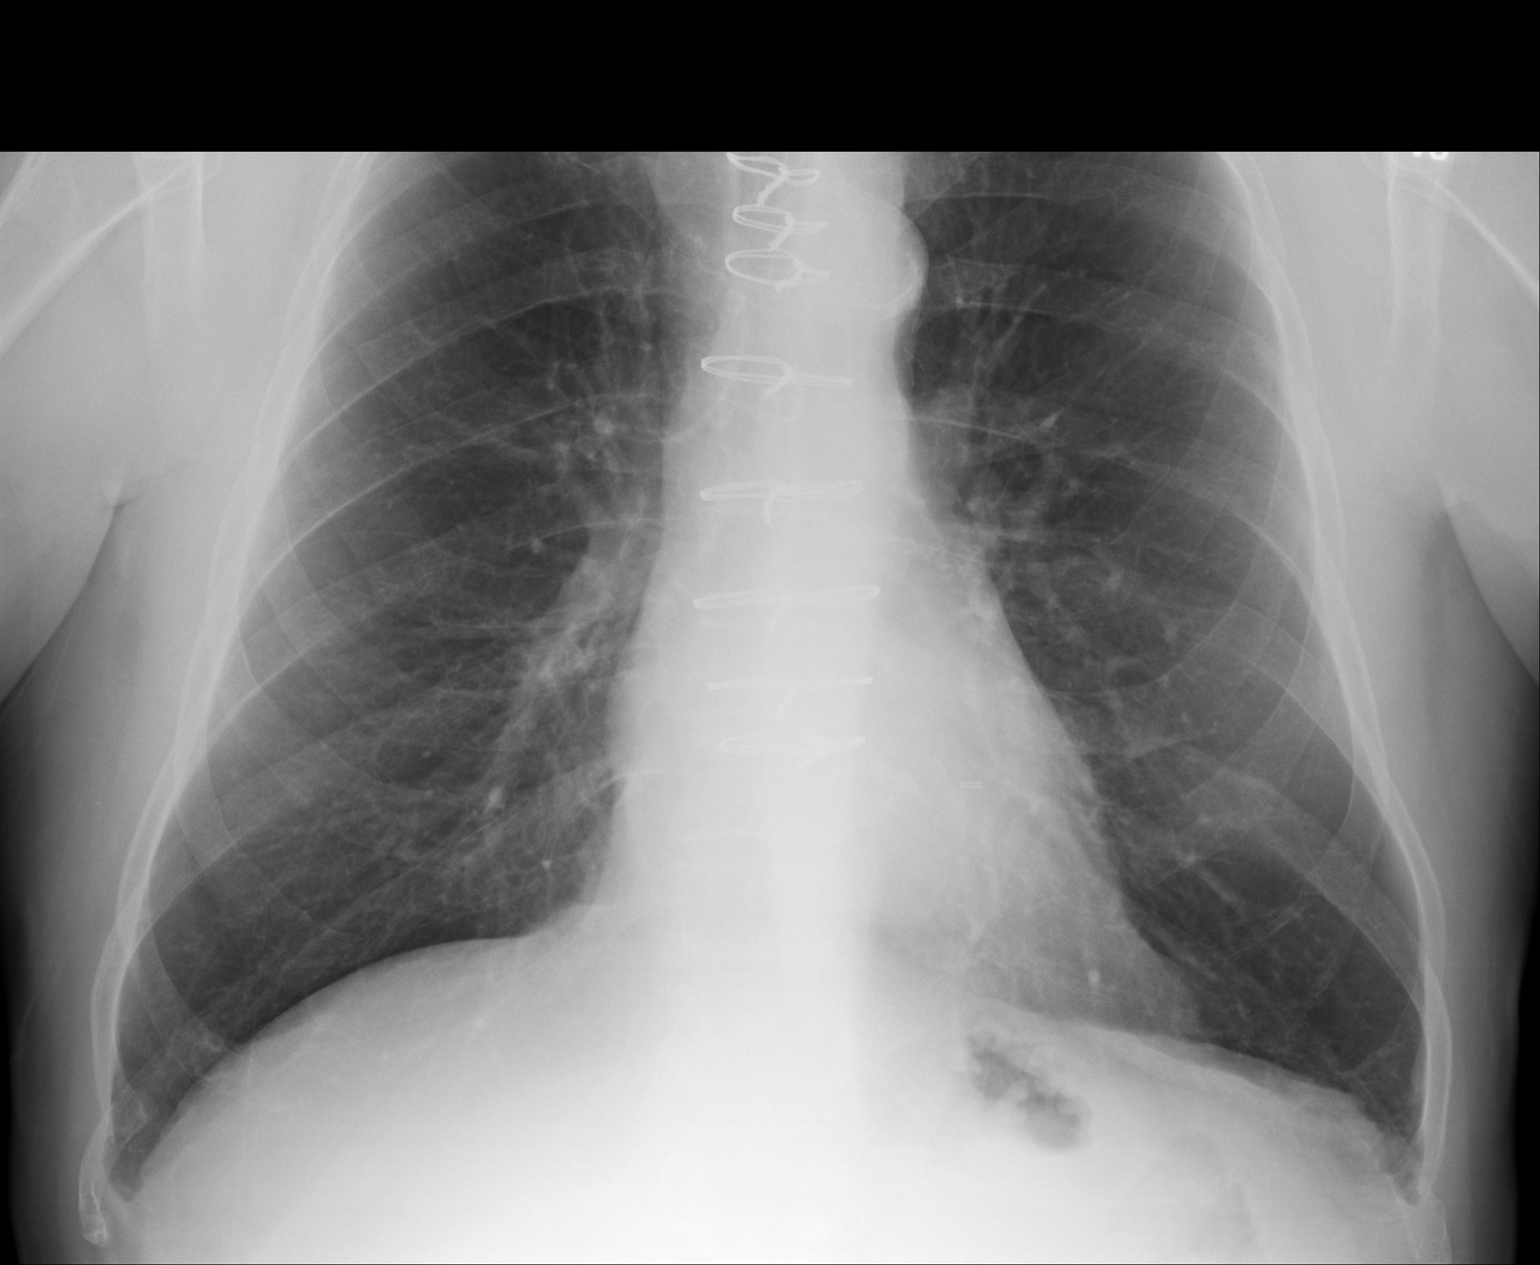

[3 of 3 positions shown; findings below may reference images not displayed]

PROCEDURE:     KDR - KDXR CHEST PA (OR AP) AND LAT  - December 18, 2011  [DATE]

RESULT:     Comparison is made to the study from 08 November, 2009.

CABG changes are present. The lungs are hyperinflated consistent with COPD.
The lungs are clear. The heart and pulmonary vessels are normal. The bony
and mediastinal structures are unremarkable. There is no effusion. There is
no pneumothorax or evidence of congestive failure.
IMPRESSION: No acute cardiopulmonary disease. Findings consistent with
COPD and CABG change. Stable appearance.

[REDACTED]

## 2012-12-31 ENCOUNTER — Other Ambulatory Visit: Payer: Self-pay

## 2013-02-24 DIAGNOSIS — Z23 Encounter for immunization: Secondary | ICD-10-CM | POA: Diagnosis not present

## 2013-02-25 DIAGNOSIS — H35369 Drusen (degenerative) of macula, unspecified eye: Secondary | ICD-10-CM | POA: Diagnosis not present

## 2013-02-25 DIAGNOSIS — H3554 Dystrophies primarily involving the retinal pigment epithelium: Secondary | ICD-10-CM | POA: Diagnosis not present

## 2013-02-25 DIAGNOSIS — H2510 Age-related nuclear cataract, unspecified eye: Secondary | ICD-10-CM | POA: Diagnosis not present

## 2013-02-25 DIAGNOSIS — H353190 Nonexudative age-related macular degeneration, unspecified eye, stage unspecified: Secondary | ICD-10-CM | POA: Diagnosis not present

## 2013-04-02 ENCOUNTER — Other Ambulatory Visit: Payer: Self-pay

## 2013-04-09 DIAGNOSIS — I10 Essential (primary) hypertension: Secondary | ICD-10-CM | POA: Diagnosis not present

## 2013-04-09 DIAGNOSIS — Z125 Encounter for screening for malignant neoplasm of prostate: Secondary | ICD-10-CM | POA: Diagnosis not present

## 2013-04-09 DIAGNOSIS — Z1339 Encounter for screening examination for other mental health and behavioral disorders: Secondary | ICD-10-CM | POA: Diagnosis not present

## 2013-04-09 DIAGNOSIS — Z0000 Encounter for general adult medical examination without abnormal findings: Secondary | ICD-10-CM | POA: Diagnosis not present

## 2013-04-09 DIAGNOSIS — Z23 Encounter for immunization: Secondary | ICD-10-CM | POA: Diagnosis not present

## 2013-04-09 DIAGNOSIS — Z1331 Encounter for screening for depression: Secondary | ICD-10-CM | POA: Diagnosis not present

## 2013-04-13 DIAGNOSIS — F331 Major depressive disorder, recurrent, moderate: Secondary | ICD-10-CM | POA: Diagnosis not present

## 2013-04-28 DIAGNOSIS — H2510 Age-related nuclear cataract, unspecified eye: Secondary | ICD-10-CM | POA: Diagnosis not present

## 2013-04-28 DIAGNOSIS — H3530 Unspecified macular degeneration: Secondary | ICD-10-CM | POA: Diagnosis not present

## 2013-05-04 ENCOUNTER — Ambulatory Visit (INDEPENDENT_AMBULATORY_CARE_PROVIDER_SITE_OTHER): Payer: Medicare Other | Admitting: Cardiology

## 2013-05-04 ENCOUNTER — Encounter: Payer: Self-pay | Admitting: Cardiology

## 2013-05-04 VITALS — BP 125/80 | Ht 73.0 in | Wt 202.0 lb

## 2013-05-04 DIAGNOSIS — I441 Atrioventricular block, second degree: Secondary | ICD-10-CM

## 2013-05-04 DIAGNOSIS — Z951 Presence of aortocoronary bypass graft: Secondary | ICD-10-CM | POA: Diagnosis not present

## 2013-05-04 DIAGNOSIS — E7800 Pure hypercholesterolemia, unspecified: Secondary | ICD-10-CM | POA: Diagnosis not present

## 2013-05-04 DIAGNOSIS — I2510 Atherosclerotic heart disease of native coronary artery without angina pectoris: Secondary | ICD-10-CM

## 2013-05-04 LAB — HEPATIC FUNCTION PANEL
ALT: 20 U/L (ref 0–53)
AST: 20 U/L (ref 0–37)
Albumin: 4.6 g/dL (ref 3.5–5.2)
Alkaline Phosphatase: 54 U/L (ref 39–117)
Bilirubin, Direct: 0.2 mg/dL (ref 0.0–0.3)
Total Bilirubin: 1.1 mg/dL (ref 0.3–1.2)
Total Protein: 7.5 g/dL (ref 6.0–8.3)

## 2013-05-04 LAB — LIPID PANEL
Cholesterol: 149 mg/dL (ref 0–200)
HDL: 62.8 mg/dL (ref >39.00)
LDL Cholesterol: 74 mg/dL (ref 0–99)
Total CHOL/HDL Ratio: 2
Triglycerides: 61 mg/dL (ref 0.0–149.0)
VLDL: 12.2 mg/dL (ref 0.0–40.0)

## 2013-05-04 LAB — BASIC METABOLIC PANEL
BUN: 19 mg/dL (ref 6–23)
CO2: 27 mEq/L (ref 19–32)
Calcium: 9.5 mg/dL (ref 8.4–10.5)
Chloride: 103 mEq/L (ref 96–112)
Creatinine, Ser: 1.1 mg/dL (ref 0.4–1.5)
GFR: 69.09 mL/min (ref >60.00)
Glucose, Bld: 93 mg/dL (ref 70–99)
Potassium: 4.2 mEq/L (ref 3.5–5.1)
Sodium: 138 mEq/L (ref 135–145)

## 2013-05-04 MED ORDER — ZOCOR 20 MG PO TABS: 20.0000 mg | ORAL_TABLET | Freq: Every day | ORAL | Status: DC

## 2013-05-04 NOTE — Progress Notes (Signed)
Jason Tate Date of Birth: 1945/07/21 Medical Record #[id number]  History of Present Illness: Jason Tate is seen in the for status post CABG in October 2012. He has a history of second degree heart block that resolved off beta blocker therapy. He is doing very well. He still notes some mild chest soreness from his surgery. He denies any anginal symptoms, shortness of breath, or palpitations. [facility] He has tried to lose weight and in fact has lost 5 lbs.   Current Outpatient Prescriptions on File Prior to Visit  Medication Sig Dispense Refill  . aspirin 325 MG tablet Take 325 mg by mouth daily.        . B Complex Vitamins (VITAMIN B COMPLEX PO) Take by mouth 2 (two) times daily.       Marland Kitchen buPROPion (WELLBUTRIN XL) 300 MG 24 hr tablet Take 300 mg by mouth daily.        . Cetirizine HCl (ZYRTEC ALLERGY PO) Take by mouth daily.      . Cholecalciferol (VITAMIN D PO) Take 4,000 mg by mouth daily.        . clonazePAM (KLONOPIN) 1 MG tablet Take 1 mg by mouth at bedtime.        . Flaxseed, Linseed, (FLAX SEED OIL PO) Take by mouth daily.      . Misc Natural Products (OSTEO BI-FLEX JOINT SHIELD PO) Take by mouth daily.      . Multiple Vitamins-Minerals (PRESERVISION/LUTEIN PO) Take by mouth 3 (three) times daily.        . nabumetone (RELAFEN) 750 MG tablet Take 750 mg by mouth 2 (two) times daily.        . Omega-3 Fatty Acids (FISH OIL PO) Take by mouth.        Marland Kitchen omeprazole (PRILOSEC) 20 MG capsule Take 20 mg by mouth daily.        . vardenafil (LEVITRA) 20 MG tablet Take 20 mg by mouth daily as needed.         No current facility-administered medications on file prior to visit.    No Known Allergies  Past Medical History  Diagnosis Date  . Sleep apnea, obstructive     on BiPAP  . Hypercholesterolemia   . Low testosterone   . PVC (premature ventricular contraction)   . Macular degeneration   . Obesity   . Abnormal stress echo Oct 2012  . CAD (coronary artery  disease) October 2012    s/p CABG x 2 per Dr. Cornelius Moras  . Mobitz type 1 second degree atrioventricular block 04/04/2012    Past Surgical History  Procedure Laterality Date  . Coronary artery bypass graft  Oct 2012    LIMA to LAD and left radial to OM    History  Smoking status  . Former Smoker  Smokeless tobacco  . Not on file    History  Alcohol Use  . Yes    Family History  Problem Relation Age of Onset  . Heart disease Mother   . Stroke Father   . Heart disease Father     Review of Systems: The review of systems is per the HPI.  All other systems were reviewed and are negative.  Physical Exam: BP 125/80  Ht 6\' 1"  (1.854 m)  Wt 202 lb (91.627 kg)  BMI 26.66 kg/m2 Patient is very pleasant and in no acute distress. Skin is warm and dry. Color is normal.  HEENT is unremarkable. Normocephalic/atraumatic. PERRL. Sclera  are nonicteric. Neck is supple. No masses. No JVD. Lungs are clear. Cardiac exam shows an irregular rate and rhythm. Sternum incision has healed well.  Abdomen is soft. Extremities are without edema. Gait and ROM are intact. No gross neurologic deficits noted.   LABORATORY DATA: Lab Results  Component Value Date   WBC 10.3 04/09/2011   HGB 9.8* 04/09/2011   HCT 29.1* 04/09/2011   PLT 409.0* 04/09/2011   GLUCOSE 108* 04/08/2012   CHOL 120 04/08/2012   TRIG 67.0 04/08/2012   HDL 52.10 04/08/2012   LDLCALC 55 04/08/2012   ALT 21 04/08/2012   AST 21 04/08/2012   NA 139 04/08/2012   K 5.0 04/08/2012   CL 103 04/08/2012   CREATININE 1.1 04/08/2012   BUN 16 04/08/2012   CO2 27 04/08/2012   INR 1.26 03/23/2011   HGBA1C 5.2 03/22/2011   Ecg shows NSR with 1st degree AV block. Old septal infarct.   Assessment / Plan: 1. Coronary disease status post CABG in October 2012. Patient is doing very well. He has made significant lifestyle modifications. Continue his current exercise regimen and attempt to lose weight.  2. Mobitz type 1 second degree AV block.  Resolved with stopping metoprolol  3. Hyperlipidemia. Continue Zocor and fish oil. We'll check fasting lab work today.

## 2013-05-04 NOTE — Patient Instructions (Signed)
We will get blood work on you today.  Continue your current therapy  I will see you in 6 months.

## 2013-05-04 NOTE — Addendum Note (Signed)
Addended by: Tonita Phoenix on: 05/04/2013 01:55 PM   Modules accepted: Orders

## 2013-05-07 DIAGNOSIS — H25019 Cortical age-related cataract, unspecified eye: Secondary | ICD-10-CM | POA: Diagnosis not present

## 2013-05-07 DIAGNOSIS — H2589 Other age-related cataract: Secondary | ICD-10-CM | POA: Diagnosis not present

## 2013-05-07 DIAGNOSIS — H26019 Infantile and juvenile cortical, lamellar, or zonular cataract, unspecified eye: Secondary | ICD-10-CM | POA: Diagnosis not present

## 2013-05-07 DIAGNOSIS — H2510 Age-related nuclear cataract, unspecified eye: Secondary | ICD-10-CM | POA: Diagnosis not present

## 2013-05-14 DIAGNOSIS — H25019 Cortical age-related cataract, unspecified eye: Secondary | ICD-10-CM | POA: Diagnosis not present

## 2013-05-14 DIAGNOSIS — H2589 Other age-related cataract: Secondary | ICD-10-CM | POA: Diagnosis not present

## 2013-05-14 DIAGNOSIS — H2510 Age-related nuclear cataract, unspecified eye: Secondary | ICD-10-CM | POA: Diagnosis not present

## 2013-07-01 DIAGNOSIS — H353190 Nonexudative age-related macular degeneration, unspecified eye, stage unspecified: Secondary | ICD-10-CM | POA: Diagnosis not present

## 2013-07-01 DIAGNOSIS — H35369 Drusen (degenerative) of macula, unspecified eye: Secondary | ICD-10-CM | POA: Diagnosis not present

## 2013-08-06 DIAGNOSIS — F331 Major depressive disorder, recurrent, moderate: Secondary | ICD-10-CM | POA: Diagnosis not present

## 2013-09-25 DIAGNOSIS — F331 Major depressive disorder, recurrent, moderate: Secondary | ICD-10-CM | POA: Diagnosis not present

## 2013-10-08 DIAGNOSIS — Z1339 Encounter for screening examination for other mental health and behavioral disorders: Secondary | ICD-10-CM | POA: Diagnosis not present

## 2013-10-08 DIAGNOSIS — Z1331 Encounter for screening for depression: Secondary | ICD-10-CM | POA: Diagnosis not present

## 2013-10-08 DIAGNOSIS — I2510 Atherosclerotic heart disease of native coronary artery without angina pectoris: Secondary | ICD-10-CM | POA: Diagnosis not present

## 2013-10-08 DIAGNOSIS — I10 Essential (primary) hypertension: Secondary | ICD-10-CM | POA: Diagnosis not present

## 2013-10-08 DIAGNOSIS — E7800 Pure hypercholesterolemia, unspecified: Secondary | ICD-10-CM | POA: Diagnosis not present

## 2013-10-08 DIAGNOSIS — R109 Unspecified abdominal pain: Secondary | ICD-10-CM | POA: Diagnosis not present

## 2013-10-28 ENCOUNTER — Emergency Department (HOSPITAL_COMMUNITY)
Admission: EM | Admit: 2013-10-28 | Discharge: 2013-10-28 | Disposition: A | Discharge status: Home / Self Care | Payer: No Typology Code available for payment source | Attending: Emergency Medicine | Admitting: Emergency Medicine

## 2013-10-28 ENCOUNTER — Encounter (HOSPITAL_COMMUNITY): Payer: Self-pay | Admitting: Emergency Medicine

## 2013-10-28 ENCOUNTER — Ambulatory Visit: Payer: Medicare Other | Admitting: Cardiology

## 2013-10-28 ENCOUNTER — Emergency Department (HOSPITAL_COMMUNITY): Payer: No Typology Code available for payment source

## 2013-10-28 DIAGNOSIS — S0990XA Unspecified injury of head, initial encounter: Secondary | ICD-10-CM | POA: Diagnosis not present

## 2013-10-28 DIAGNOSIS — T797XXA Traumatic subcutaneous emphysema, initial encounter: Secondary | ICD-10-CM

## 2013-10-28 DIAGNOSIS — S298XXA Other specified injuries of thorax, initial encounter: Secondary | ICD-10-CM | POA: Diagnosis not present

## 2013-10-28 DIAGNOSIS — R42 Dizziness and giddiness: Secondary | ICD-10-CM | POA: Diagnosis not present

## 2013-10-28 DIAGNOSIS — S2220XA Unspecified fracture of sternum, initial encounter for closed fracture: Secondary | ICD-10-CM

## 2013-10-28 DIAGNOSIS — Y9389 Activity, other specified: Secondary | ICD-10-CM | POA: Diagnosis not present

## 2013-10-28 DIAGNOSIS — I441 Atrioventricular block, second degree: Secondary | ICD-10-CM | POA: Diagnosis not present

## 2013-10-28 DIAGNOSIS — Z87891 Personal history of nicotine dependence: Secondary | ICD-10-CM | POA: Diagnosis not present

## 2013-10-28 DIAGNOSIS — I2510 Atherosclerotic heart disease of native coronary artery without angina pectoris: Secondary | ICD-10-CM | POA: Insufficient documentation

## 2013-10-28 DIAGNOSIS — G4733 Obstructive sleep apnea (adult) (pediatric): Secondary | ICD-10-CM | POA: Insufficient documentation

## 2013-10-28 DIAGNOSIS — E669 Obesity, unspecified: Secondary | ICD-10-CM | POA: Insufficient documentation

## 2013-10-28 DIAGNOSIS — Z9981 Dependence on supplemental oxygen: Secondary | ICD-10-CM | POA: Diagnosis not present

## 2013-10-28 DIAGNOSIS — IMO0002 Reserved for concepts with insufficient information to code with codable children: Secondary | ICD-10-CM | POA: Diagnosis not present

## 2013-10-28 DIAGNOSIS — Z79899 Other long term (current) drug therapy: Secondary | ICD-10-CM | POA: Diagnosis not present

## 2013-10-28 DIAGNOSIS — Z7982 Long term (current) use of aspirin: Secondary | ICD-10-CM | POA: Insufficient documentation

## 2013-10-28 DIAGNOSIS — Y9289 Other specified places as the place of occurrence of the external cause: Secondary | ICD-10-CM | POA: Insufficient documentation

## 2013-10-28 DIAGNOSIS — S0993XA Unspecified injury of face, initial encounter: Secondary | ICD-10-CM | POA: Diagnosis not present

## 2013-10-28 DIAGNOSIS — Z951 Presence of aortocoronary bypass graft: Secondary | ICD-10-CM | POA: Diagnosis not present

## 2013-10-28 LAB — BASIC METABOLIC PANEL
BUN: 17 mg/dL (ref 6–23)
CO2: 23 mEq/L (ref 19–32)
Calcium: 9.6 mg/dL (ref 8.4–10.5)
Chloride: 103 mEq/L (ref 96–112)
Creatinine, Ser: 1.1 mg/dL (ref 0.50–1.35)
GFR calc Af Amer: 77 mL/min — ABNORMAL LOW (ref >90)
GFR calc non Af Amer: 67 mL/min — ABNORMAL LOW (ref >90)
Glucose, Bld: 130 mg/dL — ABNORMAL HIGH (ref 70–99)
Potassium: 4.1 mEq/L (ref 3.7–5.3)
Sodium: 139 mEq/L (ref 137–147)

## 2013-10-28 LAB — I-STAT TROPONIN, ED: Troponin i, poc: 0 ng/mL (ref 0.00–0.08)

## 2013-10-28 LAB — CBC
HCT: 34 % — ABNORMAL LOW (ref 39.0–52.0)
Hemoglobin: 12.1 g/dL — ABNORMAL LOW (ref 13.0–17.0)
MCH: 34.4 pg — ABNORMAL HIGH (ref 26.0–34.0)
MCHC: 35.6 g/dL (ref 30.0–36.0)
MCV: 96.6 fL (ref 78.0–100.0)
Platelets: 171 10*3/uL (ref 150–400)
RBC: 3.52 MIL/uL — ABNORMAL LOW (ref 4.22–5.81)
RDW: 13.1 % (ref 11.5–15.5)
WBC: 7.7 10*3/uL (ref 4.0–10.5)

## 2013-10-28 MED ORDER — HYDROCODONE-ACETAMINOPHEN 5-325 MG PO TABS
2.0000 | ORAL_TABLET | Freq: Once | ORAL | Status: AC
  Administered 2013-10-28: 2 via ORAL
  Filled 2013-10-28: qty 2

## 2013-10-28 MED ORDER — SODIUM CHLORIDE 0.9 % IV BOLUS (SEPSIS)
1000.0000 mL | Freq: Once | INTRAVENOUS | Status: AC
  Administered 2013-10-28: 1000 mL via INTRAVENOUS

## 2013-10-28 MED ORDER — IOHEXOL 350 MG/ML SOLN
100.0000 mL | Freq: Once | INTRAVENOUS | Status: AC | PRN
  Administered 2013-10-28: 100 mL via INTRAVENOUS

## 2013-10-28 MED ORDER — HYDROCODONE-ACETAMINOPHEN 5-325 MG PO TABS: 1.0000 | ORAL_TABLET | Freq: Four times a day (QID) | ORAL | Status: DC | PRN

## 2013-10-28 MED ORDER — FENTANYL CITRATE 0.05 MG/ML IJ SOLN
50.0000 ug | Freq: Once | INTRAMUSCULAR | Status: AC
  Administered 2013-10-28: 50 ug via INTRAVENOUS
  Filled 2013-10-28: qty 2

## 2013-10-28 NOTE — Discharge Instructions (Signed)
Sternal Fracture The sternum is the bone in the center of the front of your chest which your ribs attach to. It is also called the breastbone. The most common cause of a sternal fracture (break in the bone) is an injury. The most common injury is from a motor vehicle accident. The fracture often comes from the seatbelt or hitting the chest on the steering wheel or being forcibly bent forward (shoulders towards your knees) during an accident. It is more common in females and the elderly. The fracture of the sternum is usually not a problem if there are no other injuries. Other injuries that may happen are to the ribs, heart, lungs, and abdominal organs. SYMPTOMS  Common complaints from a fracture of the sternum include:  Shortness of breath.  Pain with breathing or difficulty breathing.  Bruises about the chest.  Tenderness or a cracking sound at the breastbone. DIAGNOSIS  Your caregiver may be able to tell if the sternum is broken by examining you. Other times studies such as X-ray, CAT scan, ultrasound, and nuclear medicine are used to detect a fracture.  TREATMENT   Sternal fractures usually are not serious and if displacement is minimal, no treatment is necessary.  The main concern is with damage to the surrounding structures: ribs, heart, great vessels coming from the heart, and the back bone in the chest area.  Multiple rib fractures may cause breathing difficulties.  Injury to one of the large vessels in the chest may be a threat to life and require immediate surgery.  If injury to the heart or lungs is suspected it may be necessary to stay in the hospital and be monitored.  Other injuries will be treated as needed.  If the pieces of the breastbone are out of normal position, they may need to be reduced (put back in position) and then wired in place or fixed with a plate and screws during an operation. HOME CARE INSTRUCTIONS   Avoid strenuous activity. Be careful during  activities and avoid bumping or re-injuring the injured sternum. Activities that cause pain pull on the fracture site(s) and are best avoided if possible.  Eat a normal, well-balanced diet. Drink plenty of fluids to avoid constipation, a common side effect of pain medications.  Take deep breaths and cough several times a day, splinting the injured area with a pillow. This will help prevent pneumonia.  Do not wear a rib belt or binder for the chest unless instructed otherwise. These restrict breathing and can lead to pneumonia.  Only take over-the-counter or prescription medicines for pain, discomfort, or fever as directed by your caregiver. SEEK MEDICAL CARE IF:  You develop a continual cough, associated with thick or bloody mucus or phlegm (sputum). SEEK IMMEDIATE MEDICAL CARE IF:   You have a fever.  You have increasing difficulty breathing.  You feel sick to your stomach (nausea), vomit, or have abdominal pain.  You have worsening pain, not controlled with medications.  You develop pain in the tops of your shoulders (in the shoulder strap area).  You feel lightheaded or faint.  You develop chest pain or an abnormal heart beat (palpitations).  You develop pain radiating into the jaw, teeth or down the arms. Document Released: 12/27/2003 Document Revised: 08/06/2011 Document Reviewed: 08/16/2008 [employer] Patient Information 2014 [employer]  

## 2013-10-28 NOTE — ED Notes (Signed)
Pt remains out of the dept. In [place] at this time.

## 2013-10-28 NOTE — ED Notes (Signed)
Pt given [place] sandwich and drink per Dr. Jodi Mourning.

## 2013-10-28 NOTE — ED Notes (Signed)
Pt  Has returned from being out of the department; placed back on monitor, continuous pulse oximetry and blood pressure cuff; family at bedside

## 2013-10-28 NOTE — ED Notes (Signed)
Pt ambulatory to bathroom without any problems 
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
## 2013-10-28 NOTE — ED Notes (Signed)
Selena Batten, RN aware of pt's blood pressure

## 2013-10-28 NOTE — ED Notes (Signed)
Pt resting no needs at this time.

## 2013-10-28 NOTE — ED Notes (Signed)
Report given to Jamelle, EMT 
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
## 2013-10-28 NOTE — ED Notes (Signed)
Pt placed back on monitor, continuous pulse oximetry and blood pressure cuff; family at bedside 
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
## 2013-10-28 NOTE — ED Notes (Signed)
Pt still out of the department at this time; family at bedside waiting
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
## 2013-10-28 NOTE — ED Notes (Signed)
Pt resting at this time requesting something to eat.

## 2013-10-28 NOTE — ED Notes (Signed)
Pt remains in xray.

## 2013-10-28 NOTE — ED Notes (Signed)
Pt transported to CT scan.

## 2013-10-30 ENCOUNTER — Telehealth: Payer: Self-pay | Admitting: Cardiology

## 2013-10-30 ENCOUNTER — Ambulatory Visit: Payer: Self-pay | Admitting: Family Medicine

## 2013-10-30 DIAGNOSIS — I2510 Atherosclerotic heart disease of native coronary artery without angina pectoris: Secondary | ICD-10-CM | POA: Diagnosis not present

## 2013-10-30 DIAGNOSIS — S7010XA Contusion of unspecified thigh, initial encounter: Secondary | ICD-10-CM | POA: Diagnosis not present

## 2013-10-30 DIAGNOSIS — S2220XA Unspecified fracture of sternum, initial encounter for closed fracture: Secondary | ICD-10-CM | POA: Diagnosis not present

## 2013-10-30 DIAGNOSIS — E7800 Pure hypercholesterolemia, unspecified: Secondary | ICD-10-CM | POA: Diagnosis not present

## 2013-10-30 DIAGNOSIS — Z1331 Encounter for screening for depression: Secondary | ICD-10-CM | POA: Diagnosis not present

## 2013-10-30 DIAGNOSIS — Z1339 Encounter for screening examination for other mental health and behavioral disorders: Secondary | ICD-10-CM | POA: Diagnosis not present

## 2013-10-30 DIAGNOSIS — S79919A Unspecified injury of unspecified hip, initial encounter: Secondary | ICD-10-CM | POA: Diagnosis not present

## 2013-10-30 DIAGNOSIS — M25559 Pain in unspecified hip: Secondary | ICD-10-CM | POA: Diagnosis not present

## 2013-10-30 DIAGNOSIS — R109 Unspecified abdominal pain: Secondary | ICD-10-CM | POA: Diagnosis not present
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
## 2013-11-02 ENCOUNTER — Encounter: Payer: Self-pay | Admitting: Cardiology

## 2013-11-02 ENCOUNTER — Ambulatory Visit (INDEPENDENT_AMBULATORY_CARE_PROVIDER_SITE_OTHER): Payer: Medicare Other | Admitting: Cardiology

## 2013-11-02 ENCOUNTER — Ambulatory Visit: Payer: Medicare Other | Admitting: Cardiology

## 2013-11-02 VITALS — BP 110/72 | HR 82 | Ht 73.0 in | Wt 207.8 lb

## 2013-11-02 DIAGNOSIS — I493 Ventricular premature depolarization: Secondary | ICD-10-CM

## 2013-11-02 DIAGNOSIS — E7800 Pure hypercholesterolemia, unspecified: Secondary | ICD-10-CM | POA: Diagnosis not present

## 2013-11-02 DIAGNOSIS — I4949 Other premature depolarization: Secondary | ICD-10-CM | POA: Diagnosis not present

## 2013-11-02 DIAGNOSIS — I2510 Atherosclerotic heart disease of native coronary artery without angina pectoris: Secondary | ICD-10-CM

## 2013-11-02 DIAGNOSIS — R600 Localized edema: Secondary | ICD-10-CM

## 2013-11-02 DIAGNOSIS — Z951 Presence of aortocoronary bypass graft: Secondary | ICD-10-CM

## 2013-11-02 DIAGNOSIS — R609 Edema, unspecified: Secondary | ICD-10-CM

## 2013-11-02 NOTE — Patient Instructions (Signed)
We will check lower extremity venous dopplers  Reduce ASA to 81 mg daily  I will see you in 6 months.
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
## 2013-11-03 ENCOUNTER — Ambulatory Visit (HOSPITAL_COMMUNITY): Payer: Medicare Other | Attending: Cardiovascular Disease | Admitting: Cardiology

## 2013-11-03 DIAGNOSIS — R6889 Other general symptoms and signs: Secondary | ICD-10-CM | POA: Diagnosis not present

## 2013-11-03 DIAGNOSIS — E7800 Pure hypercholesterolemia, unspecified: Secondary | ICD-10-CM

## 2013-11-03 DIAGNOSIS — Z87891 Personal history of nicotine dependence: Secondary | ICD-10-CM | POA: Insufficient documentation

## 2013-11-03 DIAGNOSIS — I2510 Atherosclerotic heart disease of native coronary artery without angina pectoris: Secondary | ICD-10-CM | POA: Insufficient documentation

## 2013-11-03 DIAGNOSIS — R600 Localized edema: Secondary | ICD-10-CM

## 2013-11-03 DIAGNOSIS — M7989 Other specified soft tissue disorders: Secondary | ICD-10-CM | POA: Insufficient documentation

## 2013-11-03 DIAGNOSIS — E785 Hyperlipidemia, unspecified: Secondary | ICD-10-CM | POA: Diagnosis not present

## 2013-11-03 DIAGNOSIS — I493 Ventricular premature depolarization: Secondary | ICD-10-CM

## 2013-11-03 DIAGNOSIS — Z951 Presence of aortocoronary bypass graft: Secondary | ICD-10-CM

## 2013-11-03 NOTE — Progress Notes (Signed)
Lower venous duplex bilateral performed  

## 2013-11-04 ENCOUNTER — Encounter (INDEPENDENT_AMBULATORY_CARE_PROVIDER_SITE_OTHER): Payer: Self-pay

## 2013-11-04 ENCOUNTER — Ambulatory Visit (INDEPENDENT_AMBULATORY_CARE_PROVIDER_SITE_OTHER): Payer: Medicare Other | Admitting: General Surgery

## 2013-11-04 VITALS — BP 122/78 | HR 80 | Temp 97.2°F | Ht 73.0 in | Wt 213.0 lb

## 2013-11-04 DIAGNOSIS — S2220XA Unspecified fracture of sternum, initial encounter for closed fracture: Secondary | ICD-10-CM

## 2013-11-04 MED ORDER — HYDROCODONE-ACETAMINOPHEN 5-325 MG PO TABS: 1.0000 | ORAL_TABLET | Freq: Four times a day (QID) | ORAL | Status: DC | PRN
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
## 2013-11-04 NOTE — Patient Instructions (Signed)
Follow up as needed

## 2013-11-05 DIAGNOSIS — F331 Major depressive disorder, recurrent, moderate: Secondary | ICD-10-CM | POA: Diagnosis not present

## 2013-11-09 ENCOUNTER — Encounter (HOSPITAL_COMMUNITY): Payer: Medicare Other

## 2013-11-10 ENCOUNTER — Encounter (HOSPITAL_COMMUNITY): Payer: Medicare Other

## 2013-11-10 DIAGNOSIS — Z1331 Encounter for screening for depression: Secondary | ICD-10-CM | POA: Diagnosis not present

## 2013-11-10 DIAGNOSIS — Z1339 Encounter for screening examination for other mental health and behavioral disorders: Secondary | ICD-10-CM | POA: Diagnosis not present

## 2013-11-10 DIAGNOSIS — S2220XA Unspecified fracture of sternum, initial encounter for closed fracture: Secondary | ICD-10-CM | POA: Diagnosis not present

## 2013-11-10 DIAGNOSIS — S2239XA Fracture of one rib, unspecified side, initial encounter for closed fracture: Secondary | ICD-10-CM | POA: Diagnosis not present

## 2013-11-10 DIAGNOSIS — H6120 Impacted cerumen, unspecified ear: Secondary | ICD-10-CM | POA: Diagnosis not present

## 2013-11-10 DIAGNOSIS — R109 Unspecified abdominal pain: Secondary | ICD-10-CM | POA: Diagnosis not present

## 2013-11-19 ENCOUNTER — Encounter: Payer: Self-pay | Admitting: Cardiology

## 2014-01-27 DIAGNOSIS — F331 Major depressive disorder, recurrent, moderate: Secondary | ICD-10-CM | POA: Diagnosis not present

## 2014-02-25 DIAGNOSIS — H53433 Sector or arcuate defects, bilateral: Secondary | ICD-10-CM | POA: Diagnosis not present

## 2014-02-25 DIAGNOSIS — H3531 Nonexudative age-related macular degeneration: Secondary | ICD-10-CM | POA: Diagnosis not present

## 2014-02-25 DIAGNOSIS — H35363 Drusen (degenerative) of macula, bilateral: Secondary | ICD-10-CM | POA: Diagnosis not present

## 2014-03-12 ENCOUNTER — Other Ambulatory Visit: Payer: Self-pay

## 2014-03-12 DIAGNOSIS — Z23 Encounter for immunization: Secondary | ICD-10-CM | POA: Diagnosis not present

## 2014-04-14 DIAGNOSIS — Z1389 Encounter for screening for other disorder: Secondary | ICD-10-CM | POA: Diagnosis not present

## 2014-04-14 DIAGNOSIS — Z0000 Encounter for general adult medical examination without abnormal findings: Secondary | ICD-10-CM | POA: Diagnosis not present

## 2014-04-14 DIAGNOSIS — Z125 Encounter for screening for malignant neoplasm of prostate: Secondary | ICD-10-CM | POA: Diagnosis not present

## 2014-05-07 ENCOUNTER — Other Ambulatory Visit: Payer: Self-pay | Admitting: Cardiology

## 2014-05-07 ENCOUNTER — Ambulatory Visit (INDEPENDENT_AMBULATORY_CARE_PROVIDER_SITE_OTHER): Payer: Medicare Other | Admitting: Cardiology

## 2014-05-07 ENCOUNTER — Encounter: Payer: Self-pay | Admitting: Cardiology

## 2014-05-07 VITALS — BP 110/60 | HR 96 | Ht 73.0 in | Wt 206.5 lb

## 2014-05-07 DIAGNOSIS — I2510 Atherosclerotic heart disease of native coronary artery without angina pectoris: Secondary | ICD-10-CM | POA: Diagnosis not present

## 2014-05-07 DIAGNOSIS — I25810 Atherosclerosis of coronary artery bypass graft(s) without angina pectoris: Secondary | ICD-10-CM

## 2014-05-07 DIAGNOSIS — E7800 Pure hypercholesterolemia, unspecified: Secondary | ICD-10-CM

## 2014-05-07 DIAGNOSIS — Z951 Presence of aortocoronary bypass graft: Secondary | ICD-10-CM

## 2014-05-07 MED ORDER — ZOCOR 20 MG PO TABS: 20.0000 mg | ORAL_TABLET | Freq: Every day | ORAL | Status: DC

## 2014-05-07 NOTE — Telephone Encounter (Signed)
Rx has been sent to the pharmacy electronically. ° °

## 2014-05-07 NOTE — Patient Instructions (Addendum)
Continue your current therapy  We will check fasting lab work  I will see you in 6 months   
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
## 2014-05-10 DIAGNOSIS — R1032 Left lower quadrant pain: Secondary | ICD-10-CM | POA: Diagnosis not present

## 2014-05-11 ENCOUNTER — Other Ambulatory Visit: Payer: Self-pay | Admitting: Gastroenterology

## 2014-05-11 DIAGNOSIS — R1032 Left lower quadrant pain: Secondary | ICD-10-CM

## 2014-05-12 DIAGNOSIS — I25810 Atherosclerosis of coronary artery bypass graft(s) without angina pectoris: Secondary | ICD-10-CM | POA: Diagnosis not present

## 2014-05-12 DIAGNOSIS — E780 Pure hypercholesterolemia: Secondary | ICD-10-CM | POA: Diagnosis not present

## 2014-05-12 DIAGNOSIS — Z951 Presence of aortocoronary bypass graft: Secondary | ICD-10-CM | POA: Diagnosis not present

## 2014-05-13 LAB — HEPATIC FUNCTION PANEL
ALT: 17 U/L (ref 0–53)
AST: 18 U/L (ref 0–37)
Albumin: 4.3 g/dL (ref 3.5–5.2)
Alkaline Phosphatase: 57 U/L (ref 39–117)
Bilirubin, Direct: 0.1 mg/dL (ref 0.0–0.3)
Indirect Bilirubin: 0.5 mg/dL (ref 0.2–1.2)
Total Bilirubin: 0.6 mg/dL (ref 0.2–1.2)
Total Protein: 6.5 g/dL (ref 6.0–8.3)

## 2014-05-13 LAB — BASIC METABOLIC PANEL
BUN: 18 mg/dL (ref 6–23)
CO2: 28 mEq/L (ref 19–32)
Calcium: 9.3 mg/dL (ref 8.4–10.5)
Chloride: 104 mEq/L (ref 96–112)
Creat: 1.04 mg/dL (ref 0.50–1.35)
Glucose, Bld: 98 mg/dL (ref 70–99)
Potassium: 4.5 mEq/L (ref 3.5–5.3)
Sodium: 139 mEq/L (ref 135–145)

## 2014-05-13 LAB — LIPID PANEL
Cholesterol: 136 mg/dL (ref 0–200)
HDL: 53 mg/dL (ref >39)
LDL Cholesterol: 66 mg/dL (ref 0–99)
Total CHOL/HDL Ratio: 2.6 Ratio
Triglycerides: 85 mg/dL (ref <150)
VLDL: 17 mg/dL (ref 0–40)

## 2014-05-17 ENCOUNTER — Ambulatory Visit
Admission: RE | Admit: 2014-05-17 | Discharge: 2014-05-17 | Disposition: A | Discharge status: Home / Self Care | Payer: BC Managed Care – PPO | Source: Ambulatory Visit | Attending: Gastroenterology | Admitting: Gastroenterology

## 2014-05-17 DIAGNOSIS — R1032 Left lower quadrant pain: Secondary | ICD-10-CM

## 2014-05-17 DIAGNOSIS — R918 Other nonspecific abnormal finding of lung field: Secondary | ICD-10-CM | POA: Diagnosis not present

## 2014-05-17 DIAGNOSIS — I7090 Unspecified atherosclerosis: Secondary | ICD-10-CM | POA: Diagnosis not present

## 2014-05-17 MED ORDER — IOHEXOL 300 MG/ML  SOLN
125.0000 mL | Freq: Once | INTRAMUSCULAR | Status: AC | PRN
  Administered 2014-05-17: 125 mL via INTRAVENOUS

## 2014-06-02 DIAGNOSIS — Z1389 Encounter for screening for other disorder: Secondary | ICD-10-CM | POA: Diagnosis not present

## 2014-06-02 DIAGNOSIS — Z125 Encounter for screening for malignant neoplasm of prostate: Secondary | ICD-10-CM | POA: Diagnosis not present

## 2014-06-02 DIAGNOSIS — Z0000 Encounter for general adult medical examination without abnormal findings: Secondary | ICD-10-CM | POA: Diagnosis not present

## 2014-06-02 DIAGNOSIS — S39012A Strain of muscle, fascia and tendon of lower back, initial encounter: Secondary | ICD-10-CM | POA: Diagnosis not present

## 2014-06-15 DIAGNOSIS — Z961 Presence of intraocular lens: Secondary | ICD-10-CM | POA: Diagnosis not present

## 2014-06-15 DIAGNOSIS — H52203 Unspecified astigmatism, bilateral: Secondary | ICD-10-CM | POA: Diagnosis not present

## 2014-06-15 DIAGNOSIS — H31003 Unspecified chorioretinal scars, bilateral: Secondary | ICD-10-CM | POA: Diagnosis not present

## 2014-06-21 DIAGNOSIS — F331 Major depressive disorder, recurrent, moderate: Secondary | ICD-10-CM | POA: Diagnosis not present

## 2014-07-09 ENCOUNTER — Other Ambulatory Visit: Payer: Self-pay | Admitting: *Deleted

## 2014-07-09 MED ORDER — SIMVASTATIN 20 MG PO TABS: 20.0000 mg | ORAL_TABLET | Freq: Every day | ORAL | Status: DC

## 2014-07-09 NOTE — Telephone Encounter (Signed)
Walk in refill request submitted.

## 2014-08-03 DIAGNOSIS — F331 Major depressive disorder, recurrent, moderate: Secondary | ICD-10-CM | POA: Diagnosis not present

## 2014-08-30 DIAGNOSIS — I2510 Atherosclerotic heart disease of native coronary artery without angina pectoris: Secondary | ICD-10-CM | POA: Diagnosis not present

## 2014-08-30 DIAGNOSIS — I10 Essential (primary) hypertension: Secondary | ICD-10-CM | POA: Diagnosis not present

## 2014-08-30 DIAGNOSIS — J309 Allergic rhinitis, unspecified: Secondary | ICD-10-CM | POA: Diagnosis not present

## 2014-08-30 DIAGNOSIS — G4733 Obstructive sleep apnea (adult) (pediatric): Secondary | ICD-10-CM | POA: Diagnosis not present

## 2014-10-26 DIAGNOSIS — F331 Major depressive disorder, recurrent, moderate: Secondary | ICD-10-CM | POA: Diagnosis not present
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
## 2014-11-16 ENCOUNTER — Encounter: Payer: Self-pay | Admitting: Family Medicine

## 2014-11-22 ENCOUNTER — Encounter: Payer: Self-pay | Admitting: Cardiology

## 2014-11-22 ENCOUNTER — Other Ambulatory Visit: Payer: Self-pay

## 2014-11-22 ENCOUNTER — Ambulatory Visit (INDEPENDENT_AMBULATORY_CARE_PROVIDER_SITE_OTHER): Payer: Medicare Other | Admitting: Cardiology

## 2014-11-22 VITALS — BP 140/80 | HR 60 | Ht 73.0 in | Wt 209.0 lb

## 2014-11-22 DIAGNOSIS — I2510 Atherosclerotic heart disease of native coronary artery without angina pectoris: Secondary | ICD-10-CM

## 2014-11-22 DIAGNOSIS — I441 Atrioventricular block, second degree: Secondary | ICD-10-CM | POA: Diagnosis not present

## 2014-11-22 DIAGNOSIS — Z951 Presence of aortocoronary bypass graft: Secondary | ICD-10-CM
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
## 2014-11-22 NOTE — Patient Instructions (Signed)
Continue your current therapy  I will see you in 6 months with fasting lab work.   

## 2014-12-07 ENCOUNTER — Telehealth: Payer: Self-pay | Admitting: Family Medicine

## 2014-12-07 NOTE — Telephone Encounter (Signed)
Pt called saying that he has been trying to get a cpap machine for months now.  He has still not received one.  Now there is a problem with the supplier and the insurance.  He would like someone to call him back..  Thanks, TP..Marland Kitchen

## 2014-12-09 NOTE — Telephone Encounter (Signed)
Patient states that [employer] no longer has a Community education officercontract with Medicare.  He has decided that because it has taken 9 months to get things worked out with [facility] and now they can't do it he wants to wait and see who will get the Medicare contract and resubmit for machine at then.  ED

## 2014-12-13 DIAGNOSIS — F331 Major depressive disorder, recurrent, moderate: Secondary | ICD-10-CM | POA: Diagnosis not present

## 2015-01-18 DIAGNOSIS — F331 Major depressive disorder, recurrent, moderate: Secondary | ICD-10-CM | POA: Diagnosis not present

## 2015-03-01 DIAGNOSIS — H3554 Dystrophies primarily involving the retinal pigment epithelium: Secondary | ICD-10-CM | POA: Diagnosis not present

## 2015-03-01 DIAGNOSIS — H353134 Nonexudative age-related macular degeneration, bilateral, advanced atrophic with subfoveal involvement: Secondary | ICD-10-CM | POA: Diagnosis not present

## 2015-03-01 DIAGNOSIS — H35363 Drusen (degenerative) of macula, bilateral: Secondary | ICD-10-CM | POA: Diagnosis not present

## 2015-03-23 DIAGNOSIS — Z23 Encounter for immunization: Secondary | ICD-10-CM | POA: Diagnosis not present

## 2015-03-24 DIAGNOSIS — F331 Major depressive disorder, recurrent, moderate: Secondary | ICD-10-CM | POA: Diagnosis not present

## 2015-03-30 ENCOUNTER — Encounter: Payer: Self-pay | Admitting: Family Medicine

## 2015-04-18 ENCOUNTER — Ambulatory Visit (INDEPENDENT_AMBULATORY_CARE_PROVIDER_SITE_OTHER): Payer: Medicare Other | Admitting: Family Medicine

## 2015-04-18 ENCOUNTER — Encounter: Payer: Self-pay | Admitting: Family Medicine

## 2015-04-18 VITALS — BP 148/72 | HR 108 | Temp 98.1°F | Resp 18 | Ht 71.5 in | Wt 212.0 lb

## 2015-04-18 DIAGNOSIS — G4733 Obstructive sleep apnea (adult) (pediatric): Secondary | ICD-10-CM

## 2015-04-18 DIAGNOSIS — R5383 Other fatigue: Secondary | ICD-10-CM | POA: Diagnosis not present

## 2015-04-18 DIAGNOSIS — F329 Major depressive disorder, single episode, unspecified: Secondary | ICD-10-CM | POA: Diagnosis not present

## 2015-04-18 DIAGNOSIS — Z0000 Encounter for general adult medical examination without abnormal findings: Secondary | ICD-10-CM

## 2015-04-18 DIAGNOSIS — E7800 Pure hypercholesterolemia, unspecified: Secondary | ICD-10-CM | POA: Diagnosis not present

## 2015-04-18 DIAGNOSIS — F32A Depression, unspecified: Secondary | ICD-10-CM

## 2015-04-18 DIAGNOSIS — Z1211 Encounter for screening for malignant neoplasm of colon: Secondary | ICD-10-CM

## 2015-04-18 DIAGNOSIS — I2510 Atherosclerotic heart disease of native coronary artery without angina pectoris: Secondary | ICD-10-CM | POA: Diagnosis not present

## 2015-04-18 LAB — IFOBT (OCCULT BLOOD): IFOBT: NEGATIVE

## 2015-04-18 MED ORDER — CITALOPRAM HYDROBROMIDE 40 MG PO TABS: 40.0000 mg | ORAL_TABLET | Freq: Every day | ORAL | Status: DC

## 2015-04-18 NOTE — Progress Notes (Signed)
Patient ID: Jason Tate, male   DOB: 04-Jul-1945, 68 y.o.   MRN: [id number]  Visit Date: 04/18/2015  Today's Provider: Megan Mans, MD   Chief Complaint  Patient presents with  . Medicare Wellness   Subjective:   Jason Tate is a 69 y.o. male who presents today for his Subsequent Annual Wellness Visit. He feels fairly well. He reports exercising usually but lately not doing as much. He reports he is sleeping fairly well. LAST: Colonoscopy 11/27/10-tubular adenoma repeat 5 years.  Pneumovax 06/03/12  Td 09/01/09  Zoster 08/02/05  Review of Systems  Constitutional: Negative.   HENT: Negative.   Eyes: Positive for visual disturbance.  Respiratory: Positive for apnea.   Cardiovascular: Negative.   Gastrointestinal: Negative.   Endocrine: Negative.   Genitourinary: Negative.   Musculoskeletal: Positive for arthralgias.  Skin: Negative.   Allergic/Immunologic: Positive for environmental allergies.  Neurological: Negative.   Hematological: Negative.   Psychiatric/Behavioral: Positive for agitation. The patient is nervous/anxious.    he says he is more depressed due to lack of independence from his blindness  Patient Active Problem List   Diagnosis Date Noted  . Leg edema, left 11/02/2013  . 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 CAD (coronary artery disease) 04/09/2011  . Gout 04/09/2011  . Fatigue 03/05/2011  . Abnormal ECG 03/05/2011  . Hypercholesterolemia   . PVC (premature ventricular contraction)     Social History   Social History  . Marital Status: Married    Spouse Name: N/A  . Number of Children: 2  . Years of Education: N/A   Occupational History  . pharmaceutical rep    Social History Main Topics  . Smoking status: Former Smoker -- 1.00 packs/day for 20 years    Types: Cigarettes  . Smokeless tobacco: Never Used  . Alcohol Use: Yes  . Drug Use: No  . Sexual Activity: Yes   Other Topics  Concern  . Not on file   Social History Narrative    Past Surgical History  Procedure Laterality Date  . Coronary artery bypass graft  Oct 2012    LIMA to LAD and left radial to OM    His family history includes Heart disease in his father and mother; Stroke in his father.    Outpatient Prescriptions Prior to Visit  Medication Sig Dispense Refill  . allopurinol (ZYLOPRIM) 100 MG tablet Take 1 tablet by mouth daily.    Marland Kitchen aspirin 81 MG tablet Take 81 mg by mouth daily.    . B Complex Vitamins (VITAMIN B COMPLEX PO) Take 1 tablet by mouth 2 (two) times daily.     Marland Kitchen buPROPion (WELLBUTRIN XL) 300 MG 24 hr tablet Take 300 mg by mouth daily.      . Cetirizine HCl (ZYRTEC ALLERGY PO) Take 1 tablet by mouth daily as needed (allergies).     . Cholecalciferol (VITAMIN D PO) Take 4,000 mg by mouth daily.      . clonazePAM (KLONOPIN) 1 MG tablet Take 1 mg by mouth at bedtime as needed (sleep).     . Misc Natural Products (OSTEO BI-FLEX JOINT SHIELD PO) Take 1 tablet by mouth daily.     . Multiple Vitamins-Minerals (PRESERVISION/LUTEIN PO) Take 1 tablet by mouth daily.     . nabumetone (RELAFEN) 750 MG tablet Take 750 mg by mouth 2 (two) times daily.      . Omega-3 Fatty Acids (FISH OIL PO)  Take 1 tablet by mouth daily.     Marland Kitchen. omeprazole (PRILOSEC) 20 MG capsule Take 20 mg by mouth daily.      . simvastatin (ZOCOR) 20 MG tablet Take 1 tablet (20 mg total) by mouth at bedtime. 90 tablet 3  . fluticasone (FLONASE) 50 MCG/ACT nasal spray Place 1 spray into both nostrils daily as needed for allergies.      No facility-administered medications prior to visit.    No Known Allergies  Patient Care Team: Maple Hudsonichard L Gilbert Jr., MD as PCP - General (Unknown Physician Specialty) Peter M SwazilandJordan, MD (Cardiology)  Objective:   Vitals:  Filed Vitals:   04/18/15 0908  BP: 148/72  Pulse: 108  Temp: 98.1 F (36.7 C)  Resp: 18  Height: 5' 11.5" (1.816 m)  Weight: 212 lb (96.163 kg)    Physical  Exam  Constitutional: He is oriented to person, place, and time. He appears well-developed and well-nourished.  HENT:  Head: Normocephalic and atraumatic.  Right Ear: External ear normal.  Left Ear: External ear normal.  Nose: Nose normal.  Eyes: Conjunctivae are normal.  Neck: Neck supple.  Cardiovascular: Normal rate, regular rhythm and normal heart sounds.   Pulmonary/Chest: Effort normal and breath sounds normal.  Abdominal: Soft.  Neurological: He is alert and oriented to person, place, and time. No cranial nerve deficit. He exhibits normal muscle tone. Coordination normal.  Skin: Skin is warm and dry.  Psychiatric: He has a normal mood and affect. His behavior is normal. Judgment and thought content normal.    Activities of Daily Living In your present state of health, do you have any difficulty performing the following activities: 04/18/2015  Hearing? N  Vision? Y  Difficulty concentrating or making decisions? N  Walking or climbing stairs? N  Dressing or bathing? N  Doing errands, shopping? N    Fall Risk Assessment Fall Risk  04/18/2015  Falls in the past year? No     Depression Screen PHQ 2/9 Scores 04/18/2015 04/18/2015  PHQ - 2 Score 2 1  PHQ- 9 Score 6 -    Cognitive Testing - 6-CIT    Year: 0 4 points  Month: 0 3 points  Memorize "Floyde ParkinsJohn, [place], [address], [address], [place]"  Time (within 1 hour:) 0 3 points  Count backwards from 20: 0 2 4 points  Name months of year: 0 2 4 points  Repeat Address: 0 2 4 6 8 10  points   Total Score: 3/28  Interpretation : Normal (0-7) Abnormal (8-28)    Assessment & Plan:     Annual Wellness Visit  Reviewed patient's Family Medical History Reviewed and updated list of patient's medical providers Assessment of cognitive impairment was done Assessed patient's functional ability Established a written schedule for health screening services Health Risk Assessent Completed and Reviewed 1. Medicare annual wellness visit,  subsequent  2. Depression Worsening. PHQ9 today 6. Increase Citalopram to 40 mg daily. - citalopram (CELEXA) 40 MG tablet; Take 1 tablet (40 mg total) by mouth daily.  Dispense: 90 tablet; Refill: 3 - TSH Patient not suicidal at all. 3. OSA (obstructive sleep apnea) RX for new Bipap written out for the patient. He would like to get an auto titrating BiPAP machine to replace 71-year-old machine. 4. Coronary artery disease involving native coronary artery of native heart without angina pectoris check routine labs. - CBC with Differential/Platelet - TSH - Lipid Panel With LDL/HDL Ratio  5. Hypercholesterolemia - Comprehensive metabolic panel - Lipid Panel With  LDL/HDL Ratio  6. Other fatigue - TSH  7. Colon cancer screening - IFOBT POC (occult bld, rslt in office) 8.Macular degeneration with blindness   Julieanne Manson MD [facility] 04/18/2015 9:09 AM  ------------------------------------------------------------------------------------------------------------

## 2015-04-19 LAB — COMPREHENSIVE METABOLIC PANEL
ALT: 32 IU/L (ref 0–44)
AST: 23 IU/L (ref 0–40)
Albumin/Globulin Ratio: 2 (ref 1.1–2.5)
Albumin: 4.6 g/dL (ref 3.5–4.8)
Alkaline Phosphatase: 55 IU/L (ref 39–117)
BUN/Creatinine Ratio: 12 (ref 10–22)
BUN: 14 mg/dL (ref 8–27)
Bilirubin Total: 0.7 mg/dL (ref 0.0–1.2)
CO2: 24 mmol/L (ref 18–29)
Calcium: 9.5 mg/dL (ref 8.6–10.2)
Chloride: 101 mmol/L (ref 97–106)
Creatinine, Ser: 1.2 mg/dL (ref 0.76–1.27)
GFR calc Af Amer: 70 mL/min/{1.73_m2} (ref >59)
GFR calc non Af Amer: 61 mL/min/{1.73_m2} (ref >59)
Globulin, Total: 2.3 g/dL (ref 1.5–4.5)
Glucose: 107 mg/dL — ABNORMAL HIGH (ref 65–99)
Potassium: 4.7 mmol/L (ref 3.5–5.2)
Sodium: 140 mmol/L (ref 136–144)
Total Protein: 6.9 g/dL (ref 6.0–8.5)

## 2015-04-19 LAB — CBC WITH DIFFERENTIAL/PLATELET
Basophils Absolute: 0 10*3/uL (ref 0.0–0.2)
Basos: 1 %
EOS (ABSOLUTE): 0.3 10*3/uL (ref 0.0–0.4)
Eos: 5 %
Hematocrit: 37.7 % (ref 37.5–51.0)
Hemoglobin: 13.1 g/dL (ref 12.6–17.7)
Immature Grans (Abs): 0 10*3/uL (ref 0.0–0.1)
Immature Granulocytes: 0 %
Lymphocytes Absolute: 1.5 10*3/uL (ref 0.7–3.1)
Lymphs: 25 %
MCH: 34.5 pg — ABNORMAL HIGH (ref 26.6–33.0)
MCHC: 34.7 g/dL (ref 31.5–35.7)
MCV: 99 fL — ABNORMAL HIGH (ref 79–97)
Monocytes Absolute: 0.7 10*3/uL (ref 0.1–0.9)
Monocytes: 11 %
Neutrophils Absolute: 3.6 10*3/uL (ref 1.4–7.0)
Neutrophils: 58 %
Platelets: 168 10*3/uL (ref 150–379)
RBC: 3.8 x10E6/uL — ABNORMAL LOW (ref 4.14–5.80)
RDW: 13.3 % (ref 12.3–15.4)
WBC: 6.2 10*3/uL (ref 3.4–10.8)

## 2015-04-19 LAB — LIPID PANEL WITH LDL/HDL RATIO
Cholesterol, Total: 161 mg/dL (ref 100–199)
HDL: 69 mg/dL (ref >39)
LDL Calculated: 63 mg/dL (ref 0–99)
LDl/HDL Ratio: 0.9 ratio units (ref 0.0–3.6)
Triglycerides: 144 mg/dL (ref 0–149)
VLDL Cholesterol Cal: 29 mg/dL (ref 5–40)

## 2015-04-19 LAB — TSH: TSH: 2.39 u[IU]/mL (ref 0.450–4.500)

## 2015-04-19 NOTE — Progress Notes (Signed)
Advised  ED 

## 2015-04-27 DIAGNOSIS — F331 Major depressive disorder, recurrent, moderate: Secondary | ICD-10-CM | POA: Diagnosis not present

## 2015-05-05 ENCOUNTER — Other Ambulatory Visit: Payer: Self-pay

## 2015-05-05 DIAGNOSIS — F329 Major depressive disorder, single episode, unspecified: Secondary | ICD-10-CM

## 2015-05-05 DIAGNOSIS — F32A Depression, unspecified: Secondary | ICD-10-CM

## 2015-05-05 MED ORDER — CITALOPRAM HYDROBROMIDE 40 MG PO TABS: 40.0000 mg | ORAL_TABLET | Freq: Every day | ORAL | Status: DC

## 2015-05-10 ENCOUNTER — Encounter: Payer: Self-pay | Admitting: Family Medicine

## 2015-05-10 ENCOUNTER — Ambulatory Visit (INDEPENDENT_AMBULATORY_CARE_PROVIDER_SITE_OTHER): Payer: Medicare Other | Admitting: Family Medicine

## 2015-05-10 VITALS — BP 138/80 | HR 74 | Temp 97.6°F | Resp 16 | Wt 212.0 lb

## 2015-05-10 DIAGNOSIS — G4733 Obstructive sleep apnea (adult) (pediatric): Secondary | ICD-10-CM | POA: Diagnosis not present

## 2015-05-10 DIAGNOSIS — I2510 Atherosclerotic heart disease of native coronary artery without angina pectoris: Secondary | ICD-10-CM | POA: Diagnosis not present

## 2015-05-10 NOTE — Progress Notes (Signed)
Patient ID: Jason Tate, male   DOB: [birth year], 69 y.o.   MRN: [id number]    Subjective:  HPI Pt is here today for Bipap compliance. He is wearing his mask every night and he feels better using it. He has had this machine for 9 years and in need of a new machine. He has been given the rx, but was told per insurance that he needs documentation from PCP that he is compliant with using his machine.   Prior to Admission medications   Medication Sig Start Date End Date Taking? Authorizing Provider  allopurinol (ZYLOPRIM) 100 MG tablet Take 1 tablet by mouth daily. 03/22/13  Yes Historical Provider, MD  aspirin 81 MG tablet Take 81 mg by mouth daily.   Yes Historical Provider, MD  B Complex Vitamins (VITAMIN B COMPLEX PO) Take 1 tablet by mouth 2 (two) times daily.    Yes Historical Provider, MD  buPROPion (WELLBUTRIN XL) 300 MG 24 hr tablet Take 300 mg by mouth daily.     Yes Historical Provider, MD  Cetirizine HCl (ZYRTEC ALLERGY PO) Take 1 tablet by mouth daily as needed (allergies).    Yes Historical Provider, MD  Cholecalciferol (VITAMIN D PO) Take 4,000 mg by mouth daily.     Yes Historical Provider, MD  citalopram (CELEXA) 40 MG tablet Take 1 tablet (40 mg total) by mouth daily. 05/05/15  Yes Richard Hulen Shouts., MD  clonazePAM (KLONOPIN) 1 MG tablet Take 1 mg by mouth at bedtime as needed (sleep).    Yes Historical Provider, MD  fluticasone (FLONASE) 50 MCG/ACT nasal spray Place 1 spray into both nostrils daily as needed for allergies.  09/04/13  Yes Historical Provider, MD  Misc Natural Products (OSTEO BI-FLEX JOINT SHIELD PO) Take 1 tablet by mouth daily.    Yes Historical Provider, MD  Multiple Vitamins-Minerals (PRESERVISION/LUTEIN PO) Take 1 tablet by mouth daily.    Yes Historical Provider, MD  nabumetone (RELAFEN) 750 MG tablet Take 750 mg by mouth 2 (two) times daily.     Yes Historical Provider, MD  Omega-3 Fatty Acids (FISH OIL PO) Take 1 tablet by mouth daily.    Yes Historical  Provider, MD  omeprazole (PRILOSEC) 20 MG capsule Take 20 mg by mouth daily.     Yes Historical Provider, MD  simvastatin (ZOCOR) 20 MG tablet Take 1 tablet (20 mg total) by mouth at bedtime. 07/09/14  Yes Peter M [place], MD    Patient Active Problem List   Diagnosis Date Noted  . Depression 04/18/2015  . OSA (obstructive sleep apnea) 04/18/2015  . Leg edema, left 11/02/2013  . 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 CAD (coronary artery disease) 04/09/2011  . Gout 04/09/2011  . Fatigue 03/05/2011  . Abnormal ECG 03/05/2011  . Hypercholesterolemia   . PVC (premature ventricular contraction)     Past Medical History  Diagnosis Date  . Sleep apnea, obstructive     on BiPAP  . Hypercholesterolemia   . Low testosterone   . PVC (premature ventricular contraction)   . Macular degeneration   . Obesity   . Abnormal stress echo Oct 2012  . CAD (coronary artery disease) October 2012    s/p CABG x 2 per Dr. Cornelius Moras  . Mobitz type 1 second degree atrioventricular block 04/04/2012    Social History   Social History  . Marital Status: Married    Spouse Name: N/A  . Number  of Children: 2  . Years of Education: N/A   Occupational History  . pharmaceutical rep    Social History Main Topics  . Smoking status: Former Smoker -- 1.00 packs/day for 20 years    Types: Cigarettes  . Smokeless tobacco: Never Used  . Alcohol Use: Yes  . Drug Use: No  . Sexual Activity: Yes   Other Topics Concern  . Not on file   Social History Narrative    No Known Allergies  Review of Systems  Constitutional: Negative.   HENT: Negative.   Eyes: Negative.   Respiratory: Negative.   Cardiovascular: Negative.   Gastrointestinal: Negative.   Genitourinary: Negative.   Musculoskeletal: Negative.   Skin: Negative.   Neurological: Negative.   Endo/Heme/Allergies: Negative.   Psychiatric/Behavioral: Negative.     Immunization History    Administered Date(s) Administered  . Influenza, Seasonal, Injecte, Preservative Fre 03/23/2015  . Pneumococcal Polysaccharide-23 11/27/2010  . Td 09/01/2009  . Zoster 08/02/2005   Objective:  BP 138/80 mmHg  Pulse 74  Temp(Src) 97.6 F (36.4 C) (Oral)  Resp 16  Wt 212 lb (96.163 kg)  Physical Exam  Constitutional: He is well-developed, well-nourished, and in no distress.  HENT:  Head: Normocephalic and atraumatic.  Neck: Neck supple.  Cardiovascular: Normal rate, regular rhythm and normal heart sounds.   Pulmonary/Chest: Effort normal and breath sounds normal.  Abdominal: Soft.  Skin: Skin is warm and dry.  Psychiatric: Mood, memory, affect and judgment normal.    Lab Results  Component Value Date   WBC 6.2 04/18/2015   HGB 12.1* 10/28/2013   HCT 37.7 04/18/2015   PLT 171 10/28/2013   GLUCOSE 107* 04/18/2015   CHOL 161 04/18/2015   TRIG 144 04/18/2015   HDL 69 04/18/2015   LDLCALC 63 04/18/2015   TSH 2.390 04/18/2015   INR 1.26 03/23/2011   HGBA1C 5.2 03/22/2011    CMP     Component Value Date/Time   NA 140 04/18/2015 1003   NA 139 05/12/2014 1215   K 4.7 04/18/2015 1003   CL 101 04/18/2015 1003   CO2 24 04/18/2015 1003   GLUCOSE 107* 04/18/2015 1003   GLUCOSE 98 05/12/2014 1215   BUN 14 04/18/2015 1003   BUN 18 05/12/2014 1215   CREATININE 1.20 04/18/2015 1003   CREATININE 1.04 05/12/2014 1215   CALCIUM 9.5 04/18/2015 1003   PROT 6.9 04/18/2015 1003   PROT 6.5 05/12/2014 1215   ALBUMIN 4.6 04/18/2015 1003   ALBUMIN 4.3 05/12/2014 1215   AST 23 04/18/2015 1003   ALT 32 04/18/2015 1003   ALKPHOS 55 04/18/2015 1003   BILITOT 0.7 04/18/2015 1003   BILITOT 0.6 05/12/2014 1215   GFRNONAA 61 04/18/2015 1003   GFRAA 70 04/18/2015 1003    Assessment and Plan :  1. OSA (obstructive sleep apnea) Patient feels better since being on CPAP/BiPAP. He is compliant with wearing it and uses it nightly. He has worn out his old machine. 2. CAD All risk factors  treated 3. 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 Depression and anxiety Improved and stable on citalopram at 20 mg dose I have done the exam and reviewed the above chart and it is accurate to the best of my knowledge.  Julieanne Mansonichard Gilbert MD [facility] 05/10/2015 3:20 PM

## 2015-06-14 ENCOUNTER — Ambulatory Visit (INDEPENDENT_AMBULATORY_CARE_PROVIDER_SITE_OTHER): Payer: Medicare Other | Admitting: Cardiology

## 2015-06-14 ENCOUNTER — Encounter: Payer: Self-pay | Admitting: Cardiology

## 2015-06-14 VITALS — BP 112/80 | HR 72 | Ht 71.5 in | Wt 212.0 lb

## 2015-06-14 DIAGNOSIS — I25810 Atherosclerosis of coronary artery bypass graft(s) without angina pectoris: Secondary | ICD-10-CM

## 2015-06-14 DIAGNOSIS — E7800 Pure hypercholesterolemia, unspecified: Secondary | ICD-10-CM

## 2015-06-14 DIAGNOSIS — Z951 Presence of aortocoronary bypass graft: Secondary | ICD-10-CM

## 2015-06-14 MED ORDER — SIMVASTATIN 20 MG PO TABS: 20.0000 mg | ORAL_TABLET | Freq: Every day | ORAL | Status: DC

## 2015-06-14 NOTE — Progress Notes (Signed)
Jason Tate Date of Birth: [birth year] Medical Record #[id number]  History of Present Illness: Mr. Dupler is seen for follow up of CAD. He is status post CABG in October 2012. He has a history of second degree heart block that resolved with stopping beta blocker therapy.   Since his last visit he  denies any chest pain or SOB. He walks a lot. He no longer drives due to macular degeneration. He hasn't gone to the gym in the last month and has gained some weight.    Current Outpatient Prescriptions on File Prior to Visit  Medication Sig Dispense Refill  . allopurinol (ZYLOPRIM) 100 MG tablet Take 1 tablet by mouth daily.    Marland Kitchen aspirin 81 MG tablet Take 81 mg by mouth daily.    . B Complex Vitamins (VITAMIN B COMPLEX PO) Take 1 tablet by mouth 2 (two) times daily.     Marland Kitchen buPROPion (WELLBUTRIN XL) 300 MG 24 hr tablet Take 300 mg by mouth daily.      . Cetirizine HCl (ZYRTEC ALLERGY PO) Take 1 tablet by mouth daily as needed (allergies).     . Cholecalciferol (VITAMIN D PO) Take 4,000 mg by mouth daily.      . citalopram (CELEXA) 40 MG tablet Take 1 tablet (40 mg total) by mouth daily. 90 tablet 3  . clonazePAM (KLONOPIN) 1 MG tablet Take 1 mg by mouth at bedtime as needed (sleep).     . fluticasone (FLONASE) 50 MCG/ACT nasal spray Place 1 spray into both nostrils daily as needed for allergies.     . Misc Natural Products (OSTEO BI-FLEX JOINT SHIELD PO) Take 1 tablet by mouth daily.     . Multiple Vitamins-Minerals (PRESERVISION/LUTEIN PO) Take 1 tablet by mouth daily.     . nabumetone (RELAFEN) 750 MG tablet Take 750 mg by mouth 2 (two) times daily.      . Omega-3 Fatty Acids (FISH OIL PO) Take 1 tablet by mouth daily.     Marland Kitchen omeprazole (PRILOSEC) 20 MG capsule Take 20 mg by mouth daily.       No current facility-administered medications on file prior to visit.    No Known Allergies  Past Medical History  Diagnosis Date  . Sleep apnea, obstructive     on BiPAP  .  Hypercholesterolemia   . Low testosterone   . PVC (premature ventricular contraction)   . Macular degeneration   . Obesity   . Abnormal stress echo Oct 2012  . CAD (coronary artery disease) October 2012    s/p CABG x 2 per Dr. Cornelius Moras  . Mobitz type 1 second degree atrioventricular block 04/04/2012    Past Surgical History  Procedure Laterality Date  . Coronary artery bypass graft  Oct 2012    LIMA to LAD and left radial to OM    History  Smoking status  . Former Smoker -- 1.00 packs/day for 20 years  . Types: Cigarettes  Smokeless tobacco  . Never Used    History  Alcohol Use  . Yes    Family History  Problem Relation Age of Onset  . Heart disease Mother   . Stroke Father   . Heart disease Father     Review of Systems: The review of systems is per the HPI.  All other systems were reviewed and are negative.  Physical Exam: BP 112/80 mmHg  Pulse 72  Ht 5' 11.5" (1.816 m)  Wt 96.163 kg (212 lb)  BMI 29.16 kg/m2  Patient is very pleasant and in no acute distress. Skin is warm and dry. Color is normal.  HEENT is unremarkable. Normocephalic/atraumatic. PERRL. Sclera are nonicteric. Neck is supple. No masses. No JVD. Lungs are clear. Cardiac exam shows an irregular rate and rhythm. Abdomen is soft, NT. No masses or bruits.  No edema.  Gait and ROM are intact. No gross neurologic deficits noted.   LABORATORY DATA: Lab Results  Component Value Date   WBC 6.2 04/18/2015   HGB 12.1* 10/28/2013   HCT 37.7 04/18/2015   PLT 168 04/18/2015   GLUCOSE 107* 04/18/2015   CHOL 161 04/18/2015   TRIG 144 04/18/2015   HDL 69 04/18/2015   LDLCALC 63 04/18/2015   ALT 32 04/18/2015   AST 23 04/18/2015   NA 140 04/18/2015   K 4.7 04/18/2015   CL 101 04/18/2015   CREATININE 1.20 04/18/2015   BUN 14 04/18/2015   CO2 24 04/18/2015   TSH 2.390 04/18/2015   INR 1.26 03/23/2011   HGBA1C 5.2 03/22/2011     Assessment / Plan: 1. Coronary disease status post CABG in October 2012.  Patient is doing very well. Continue his current medication and exercise regimen  2. Mobitz type 1 second degree AV block. Resolved with stopping metoprolol  3. Hyperlipidemia. Excellent control. Continue Zocor and fish oil.     I will follow up in one year.

## 2015-06-14 NOTE — Patient Instructions (Signed)
Continue your current therapy  I will see you in one year   

## 2015-06-17 DIAGNOSIS — Z961 Presence of intraocular lens: Secondary | ICD-10-CM | POA: Diagnosis not present

## 2015-06-17 DIAGNOSIS — H5203 Hypermetropia, bilateral: Secondary | ICD-10-CM | POA: Diagnosis not present

## 2015-06-21 ENCOUNTER — Ambulatory Visit: Payer: Medicare Other | Admitting: Family Medicine

## 2015-08-01 ENCOUNTER — Ambulatory Visit (INDEPENDENT_AMBULATORY_CARE_PROVIDER_SITE_OTHER): Payer: Medicare Other | Admitting: Family Medicine

## 2015-08-01 VITALS — BP 114/62 | HR 72 | Temp 97.7°F | Resp 16 | Wt 217.0 lb

## 2015-08-01 DIAGNOSIS — H6123 Impacted cerumen, bilateral: Secondary | ICD-10-CM | POA: Diagnosis not present

## 2015-08-01 DIAGNOSIS — G4700 Insomnia, unspecified: Secondary | ICD-10-CM | POA: Diagnosis not present

## 2015-08-01 DIAGNOSIS — F32A Depression, unspecified: Secondary | ICD-10-CM

## 2015-08-01 DIAGNOSIS — M10069 Idiopathic gout, unspecified knee: Secondary | ICD-10-CM | POA: Diagnosis not present

## 2015-08-01 DIAGNOSIS — I25810 Atherosclerosis of coronary artery bypass graft(s) without angina pectoris: Secondary | ICD-10-CM | POA: Diagnosis not present

## 2015-08-01 DIAGNOSIS — M109 Gout, unspecified: Secondary | ICD-10-CM

## 2015-08-01 DIAGNOSIS — F329 Major depressive disorder, single episode, unspecified: Secondary | ICD-10-CM

## 2015-08-01 MED ORDER — ALLOPURINOL 100 MG PO TABS: 100.0000 mg | ORAL_TABLET | Freq: Every day | ORAL | Status: DC

## 2015-08-01 MED ORDER — CLONAZEPAM 1 MG PO TABS: 1.0000 mg | ORAL_TABLET | Freq: Every evening | ORAL | Status: DC | PRN

## 2015-08-01 MED ORDER — BUPROPION HCL ER (XL) 300 MG PO TB24: 300.0000 mg | ORAL_TABLET | Freq: Every day | ORAL | Status: DC

## 2015-08-01 MED ORDER — CITALOPRAM HYDROBROMIDE 20 MG PO TABS: 20.0000 mg | ORAL_TABLET | Freq: Every day | ORAL | Status: DC

## 2015-08-01 NOTE — Progress Notes (Signed)
Patient ID: Jason Tate, male   DOB: 11/23/1945, 70 y.o.   MRN: [id number]   Jason Tate  MRN: [id number] DOB: 07/16/1945  Subjective:  HPI  1. Depression The patient is a 70 year old male who presents for follow up of his depression.  He was last visit was on 05/10/15 and no management changes were made at that time.  He is currently on Bupropion 300 mg and Citalopram 40 mg both daily.  He reports good compliance, tolerance and control of symptoms with the medications.  He is in need or refill on the Citalopram today.  Patient Active Problem List   Diagnosis Date Noted  . Depression 04/18/2015  . OSA (obstructive sleep apnea) 04/18/2015  . Leg edema, left 11/02/2013  . 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 CAD (coronary artery disease) 04/09/2011  . Gout 04/09/2011  . Fatigue 03/05/2011  . Abnormal ECG 03/05/2011  . Hypercholesterolemia   . PVC (premature ventricular contraction)     Past Medical History  Diagnosis Date  . Sleep apnea, obstructive     on BiPAP  . Hypercholesterolemia   . Low testosterone   . PVC (premature ventricular contraction)   . Macular degeneration   . Obesity   . Abnormal stress echo Oct 2012  . CAD (coronary artery disease) October 2012    s/p CABG x 2 per Dr. Cornelius Moraswen  . Mobitz type 1 second degree atrioventricular block 04/04/2012    Social History   Social History  . Marital Status: Married    Spouse Name: N/A  . Number of Children: 2  . Years of Education: N/A   Occupational History  . pharmaceutical rep    Social History Main Topics  . Smoking status: Former Smoker -- 1.00 packs/day for 20 years    Types: Cigarettes  . Smokeless tobacco: Never Used  . Alcohol Use: Yes  . Drug Use: No  . Sexual Activity: Yes   Other Topics Concern  . Not on file   Social History Narrative    Outpatient Prescriptions Prior to Visit  Medication Sig Dispense Refill  . allopurinol  (ZYLOPRIM) 100 MG tablet Take 1 tablet by mouth daily.    Marland Kitchen. aspirin 81 MG tablet Take 81 mg by mouth daily.    . B Complex Vitamins (VITAMIN B COMPLEX PO) Take 1 tablet by mouth 2 (two) times daily.     Marland Kitchen. buPROPion (WELLBUTRIN XL) 300 MG 24 hr tablet Take 300 mg by mouth daily.      . Cetirizine HCl (ZYRTEC ALLERGY PO) Take 1 tablet by mouth daily as needed (allergies).     . Cholecalciferol (VITAMIN D PO) Take 4,000 mg by mouth daily.      . citalopram (CELEXA) 40 MG tablet Take 1 tablet (40 mg total) by mouth daily. 90 tablet 3  . clonazePAM (KLONOPIN) 1 MG tablet Take 1 mg by mouth at bedtime as needed (sleep).     . fluticasone (FLONASE) 50 MCG/ACT nasal spray Place 1 spray into both nostrils daily as needed for allergies.     . Misc Natural Products (OSTEO BI-FLEX JOINT SHIELD PO) Take 1 tablet by mouth daily.     . Multiple Vitamins-Minerals (PRESERVISION/LUTEIN PO) Take 1 tablet by mouth daily.     . nabumetone (RELAFEN) 750 MG tablet Take 750 mg by mouth 2 (two) times daily.      . Omega-3 Fatty Acids (FISH OIL  PO) Take 1 tablet by mouth daily.     Marland Kitchen omeprazole (PRILOSEC) 20 MG capsule Take 20 mg by mouth daily.      . simvastatin (ZOCOR) 20 MG tablet Take 1 tablet (20 mg total) by mouth at bedtime. 90 tablet 3   No facility-administered medications prior to visit.    No Known Allergies  Review of Systems  Constitutional: Negative for fever, chills and malaise/fatigue.  HENT:       Patient does note that he feels his right is stopped up and would like to have it looked at today.  He does have a history of increased wax production requiring irrigation on a number of occasions.  Respiratory: Negative for cough, shortness of breath and wheezing.   Cardiovascular: Negative for chest pain, palpitations, orthopnea and leg swelling.  Neurological: Negative for dizziness, weakness and headaches.   Objective:  BP 114/62 mmHg  Pulse 72  Temp(Src) 97.7 F (36.5 C) (Oral)  Resp 16  Wt  217 lb (98.431 kg)  Physical Exam  Constitutional: He is oriented to person, place, and time and well-developed, well-nourished, and in no distress.  HENT:  Head: Normocephalic and atraumatic.  cerumenosis bilaterally  Neck: Normal range of motion.  Cardiovascular: Normal rate, regular rhythm and normal heart sounds.   Pulmonary/Chest: Effort normal and breath sounds normal.  Neurological: He is alert and oriented to person, place, and time.  Skin: Skin is warm and dry.  Psychiatric: Mood, memory, affect and judgment normal.    Assessment and Plan :   1. Depression  - citalopram (CELEXA) 20 MG tablet; Take 1 tablet (20 mg total) by mouth daily.  Dispense: 90 tablet; Refill: 3 - buPROPion (WELLBUTRIN XL) 300 MG 24 hr tablet; Take 1 tablet (300 mg total) by mouth daily.  Dispense: 90 tablet; Refill: 3  2. Gout of knee, unspecified cause, unspecified chronicity, unspecified laterality  - allopurinol (ZYLOPRIM) 100 MG tablet; Take 1 tablet (100 mg total) by mouth daily.  Dispense: 90 tablet; Refill: 3  3. Insomnia  - clonazePAM (KLONOPIN) 1 MG tablet; Take 1 tablet (1 mg total) by mouth at bedtime as needed (sleep).  Dispense: 30 tablet; Refill: 5  4. Cerumen impaction, bilateral 5. CAD All risk factors treated I have done the exam and reviewed the above chart and it is accurate to the best of my knowledge.  - Ear Lavage-patient was given dubrox in both ears.  After sufficient time his ears were irrigated.  We were unable to clear the impaction and the patient has been advised to use Dubrox bilaterally nightly until we see him again in 4 days.  We will attempt again to irrigate the ears.  If results continue to be unsatisfactory we will refer to ENT.    Julieanne Manson MD [facility] 08/01/2015 8:35 AM

## 2015-08-05 ENCOUNTER — Ambulatory Visit (INDEPENDENT_AMBULATORY_CARE_PROVIDER_SITE_OTHER): Payer: Medicare Other

## 2015-08-05 DIAGNOSIS — H6123 Impacted cerumen, bilateral: Secondary | ICD-10-CM | POA: Diagnosis not present

## 2015-08-05 NOTE — Progress Notes (Signed)
1. Cerumen impaction, bilateral  - Ear Lavage-The patient presents today after using Dubrox daily as directed.  His ears are irrigated without difficulty and large plug and copious amounts of softened cerumen is removed from ears.  Patient instructed on care of ears and advised to return if problem re-occurs.Pt tolerated well.

## 2015-08-18 DIAGNOSIS — L218 Other seborrheic dermatitis: Secondary | ICD-10-CM | POA: Diagnosis not present

## 2015-08-18 DIAGNOSIS — L821 Other seborrheic keratosis: Secondary | ICD-10-CM | POA: Diagnosis not present

## 2015-08-18 DIAGNOSIS — L739 Follicular disorder, unspecified: Secondary | ICD-10-CM | POA: Diagnosis not present

## 2015-10-27 ENCOUNTER — Telehealth: Payer: Self-pay | Admitting: Family Medicine

## 2015-10-27 MED ORDER — OMEPRAZOLE 20 MG PO CPDR: 20.0000 mg | DELAYED_RELEASE_CAPSULE | Freq: Every day | ORAL | Status: DC

## 2015-10-27 NOTE — Telephone Encounter (Signed)
Patient needs a refill for omeprazole (PRILOSEC) 20 MG capsule 90 day supply sent to [facility] mail order pharmacy.

## 2015-10-27 NOTE — Telephone Encounter (Signed)
Done-aa 

## 2015-12-08 DIAGNOSIS — K648 Other hemorrhoids: Secondary | ICD-10-CM | POA: Diagnosis not present

## 2015-12-08 DIAGNOSIS — K641 Second degree hemorrhoids: Secondary | ICD-10-CM | POA: Diagnosis not present

## 2015-12-08 DIAGNOSIS — K5730 Diverticulosis of large intestine without perforation or abscess without bleeding: Secondary | ICD-10-CM | POA: Diagnosis not present

## 2015-12-08 DIAGNOSIS — Z09 Encounter for follow-up examination after completed treatment for conditions other than malignant neoplasm: Secondary | ICD-10-CM | POA: Diagnosis not present

## 2015-12-08 DIAGNOSIS — Z86010 Personal history of colonic polyps: Secondary | ICD-10-CM | POA: Diagnosis not present

## 2015-12-08 DIAGNOSIS — Z1211 Encounter for screening for malignant neoplasm of colon: Secondary | ICD-10-CM | POA: Diagnosis not present

## 2015-12-16 ENCOUNTER — Encounter: Payer: Self-pay | Admitting: Family Medicine

## 2016-02-01 DIAGNOSIS — K641 Second degree hemorrhoids: Secondary | ICD-10-CM | POA: Diagnosis not present

## 2016-02-22 DIAGNOSIS — K641 Second degree hemorrhoids: Secondary | ICD-10-CM | POA: Diagnosis not present

## 2016-02-28 DIAGNOSIS — H353134 Nonexudative age-related macular degeneration, bilateral, advanced atrophic with subfoveal involvement: Secondary | ICD-10-CM | POA: Diagnosis not present

## 2016-02-28 DIAGNOSIS — H53433 Sector or arcuate defects, bilateral: Secondary | ICD-10-CM | POA: Diagnosis not present

## 2016-02-28 DIAGNOSIS — H3554 Dystrophies primarily involving the retinal pigment epithelium: Secondary | ICD-10-CM | POA: Diagnosis not present

## 2016-02-28 DIAGNOSIS — H35363 Drusen (degenerative) of macula, bilateral: Secondary | ICD-10-CM | POA: Diagnosis not present

## 2016-03-07 DIAGNOSIS — K641 Second degree hemorrhoids: Secondary | ICD-10-CM | POA: Diagnosis not present

## 2016-03-23 DIAGNOSIS — Z23 Encounter for immunization: Secondary | ICD-10-CM | POA: Diagnosis not present

## 2016-04-10 ENCOUNTER — Other Ambulatory Visit: Payer: Self-pay

## 2016-04-10 DIAGNOSIS — G4709 Other insomnia: Secondary | ICD-10-CM

## 2016-04-10 MED ORDER — CLONAZEPAM 1 MG PO TABS: 1.0000 mg | ORAL_TABLET | Freq: Every evening | ORAL | 4 refills | Status: DC | PRN

## 2016-04-10 NOTE — Telephone Encounter (Signed)
Refill request from [facility] for refill on Clonazepam. Please review-aa

## 2016-04-10 NOTE — Telephone Encounter (Signed)
4 rf 

## 2016-04-18 ENCOUNTER — Encounter: Payer: Medicare Other | Admitting: Family Medicine

## 2016-06-05 ENCOUNTER — Encounter: Payer: Self-pay | Admitting: Family Medicine

## 2016-06-05 ENCOUNTER — Ambulatory Visit (INDEPENDENT_AMBULATORY_CARE_PROVIDER_SITE_OTHER): Payer: Medicare Other | Admitting: Family Medicine

## 2016-06-05 ENCOUNTER — Ambulatory Visit: Payer: Medicare Other

## 2016-06-05 VITALS — BP 118/72 | HR 84 | Temp 97.9°F | Wt 218.8 lb

## 2016-06-05 VITALS — BP 118/72 | HR 84 | Temp 97.9°F | Ht 72.0 in | Wt 218.5 lb

## 2016-06-05 DIAGNOSIS — Z0000 Encounter for general adult medical examination without abnormal findings: Secondary | ICD-10-CM

## 2016-06-05 DIAGNOSIS — G4733 Obstructive sleep apnea (adult) (pediatric): Secondary | ICD-10-CM | POA: Diagnosis not present

## 2016-06-05 DIAGNOSIS — Q840 Congenital alopecia: Secondary | ICD-10-CM

## 2016-06-05 DIAGNOSIS — M109 Gout, unspecified: Secondary | ICD-10-CM

## 2016-06-05 DIAGNOSIS — Z23 Encounter for immunization: Secondary | ICD-10-CM | POA: Diagnosis not present

## 2016-06-05 DIAGNOSIS — E7800 Pure hypercholesterolemia, unspecified: Secondary | ICD-10-CM

## 2016-06-05 DIAGNOSIS — K219 Gastro-esophageal reflux disease without esophagitis: Secondary | ICD-10-CM

## 2016-06-05 DIAGNOSIS — G4709 Other insomnia: Secondary | ICD-10-CM

## 2016-06-05 DIAGNOSIS — F5101 Primary insomnia: Secondary | ICD-10-CM | POA: Diagnosis not present

## 2016-06-05 DIAGNOSIS — H3530 Unspecified macular degeneration: Secondary | ICD-10-CM | POA: Diagnosis not present

## 2016-06-05 DIAGNOSIS — F32A Depression, unspecified: Secondary | ICD-10-CM

## 2016-06-05 DIAGNOSIS — F329 Major depressive disorder, single episode, unspecified: Secondary | ICD-10-CM

## 2016-06-05 MED ORDER — NABUMETONE 500 MG PO TABS: 500.0000 mg | ORAL_TABLET | Freq: Two times a day (BID) | ORAL | 1 refills | Status: DC

## 2016-06-05 MED ORDER — CLONAZEPAM 1 MG PO TABS: 1.0000 mg | ORAL_TABLET | Freq: Every evening | ORAL | 4 refills | Status: DC | PRN

## 2016-06-05 MED ORDER — ALLOPURINOL 100 MG PO TABS: 100.0000 mg | ORAL_TABLET | Freq: Every day | ORAL | 3 refills | Status: DC

## 2016-06-05 MED ORDER — CITALOPRAM HYDROBROMIDE 20 MG PO TABS: 20.0000 mg | ORAL_TABLET | Freq: Every day | ORAL | 3 refills | Status: DC

## 2016-06-05 MED ORDER — OMEPRAZOLE 20 MG PO CPDR: 20.0000 mg | DELAYED_RELEASE_CAPSULE | Freq: Every day | ORAL | 3 refills | Status: DC

## 2016-06-05 MED ORDER — BUPROPION HCL ER (XL) 300 MG PO TB24
300.0000 mg | ORAL_TABLET | Freq: Every day | ORAL | 3 refills | Status: DC
Start: 2016-06-05 — End: 2017-06-06

## 2016-06-05 NOTE — Progress Notes (Signed)
Subjective:  HPI Depression- last office visit 08/01/15. Pt reports that he is feeling well emotionally and is taking his Wellbutrin, Celexa, and Klonopin and tolerating it well. He will need refills on all his medications. Pt reports that he would like to discuss the Prevnar with Dr. Sullivan Lone before he gets it.    Lipid/Cholesterol, Follow-up:   Last seen for this 10 months ago.  Management changes since that visit include none. . Last Lipid Panel:    Component Value Date/Time   CHOL 161 04/18/2015 1003   TRIG 144 04/18/2015 1003   HDL 69 04/18/2015 1003   CHOLHDL 2.6 05/12/2014 1215   VLDL 17 05/12/2014 1215   LDLCALC 63 04/18/2015 1003    Risk factors for vascular disease include hypercholesterolemia  He reports good compliance with treatment. He is not having side effects.  Current symptoms include none and have been unchanged. Weight trend: stable  Wt Readings from Last 3 Encounters:  06/05/16 218 lb 12.8 oz (99.2 kg)  06/05/16 218 lb 8 oz (99.1 kg)  08/01/15 217 lb (98.4 kg)    -------------------------------------------------------------------   Prior to Admission medications   Medication Sig Start Date End Date Taking? Authorizing Provider  allopurinol (ZYLOPRIM) 100 MG tablet Take 1 tablet (100 mg total) by mouth daily. 08/01/15  Yes Richard Hulen Shouts., MD  aspirin 81 MG tablet Take 81 mg by mouth daily.   Yes Historical Provider, MD  B Complex Vitamins (VITAMIN B COMPLEX PO) Take 1 tablet by mouth daily.    Yes Historical Provider, MD  buPROPion (WELLBUTRIN XL) 300 MG 24 hr tablet Take 1 tablet (300 mg total) by mouth daily. 08/01/15  Yes Richard Hulen Shouts., MD  Cetirizine HCl (ZYRTEC ALLERGY PO) Take 1 tablet by mouth daily as needed (allergies).    Yes Historical Provider, MD  Cholecalciferol (VITAMIN D PO) Take 4,000 mg by mouth daily.     Yes Historical Provider, MD  citalopram (CELEXA) 20 MG tablet Take 1 tablet (20 mg total) by mouth daily. 08/01/15   Yes Richard Hulen Shouts., MD  clonazePAM (KLONOPIN) 1 MG tablet Take 1 tablet (1 mg total) by mouth at bedtime as needed (sleep). 04/10/16  Yes Richard Hulen Shouts., MD  fluticasone [facility]) 50 MCG/ACT nasal spray Place 1 spray into both nostrils daily as needed for allergies.  09/04/13  Yes Historical Provider, MD  Misc Natural Products (OSTEO BI-FLEX JOINT SHIELD PO) Take 1 tablet by mouth daily.    Yes Historical Provider, MD  Multiple Vitamins-Minerals (PRESERVISION/LUTEIN PO) Take 1 tablet by mouth daily.    Yes Historical Provider, MD  nabumetone (RELAFEN) 500 MG tablet Take 500 mg by mouth 2 (two) times daily.   Yes Historical Provider, MD  nabumetone (RELAFEN) 750 MG tablet Take 750 mg by mouth 2 (two) times daily.     Yes Historical Provider, MD  Omega-3 Fatty Acids (FISH OIL PO) Take 1 tablet by mouth daily.    Yes Historical Provider, MD  omeprazole (PRILOSEC) 20 MG capsule Take 1 capsule (20 mg total) by mouth daily. 10/27/15  Yes Richard Hulen Shouts., MD  simvastatin (ZOCOR) 20 MG tablet Take 1 tablet (20 mg total) by mouth at bedtime. 06/14/15  Yes Peter M Swazil[place], MD    Patient Active Problem List   Diagnosis Date Noted  . Depression 04/18/2015  . OSA (obstructive sleep apnea) 04/18/2015  . Leg edema, left 11/02/2013  . Mobitz type 1 second degree atrioventricular block  04/04/2012  . 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 CAD (coronary artery disease) 04/09/2011  . Gout 04/09/2011  . Fatigue 03/05/2011  . Abnormal ECG 03/05/2011  . Hypercholesterolemia   . PVC (premature ventricular contraction)     Past Medical History:  Diagnosis Date  . Abnormal stress echo Oct 2012  . CAD (coronary artery disease) October 2012   s/p CABG x 2 per Dr. Cornelius Moras  . Hypercholesterolemia   . Low testosterone   . Macular degeneration   . 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 Obesity   . PVC (premature ventricular contraction)   . Sleep apnea, obstructive    on BiPAP     Social History   Social History  . Marital status: Married    Spouse name: N/A  . Number of children: 2  . Years of education: N/A   Occupational History  . pharmaceutical rep    Social History Main Topics  . Smoking status: Former Smoker    Packs/day: 1.00    Years: 20.00    Types: Cigarettes    Quit date: 03/28/1980  . Smokeless tobacco: Never Used  . Alcohol use 16.8 oz/week    28 Cans of beer per week  . Drug use: No  . Sexual activity: Yes   Other Topics Concern  . Not on file   Social History Narrative  . No narrative on file    No Known Allergies  Review of Systems  Constitutional: Negative.   HENT: Negative.   Eyes: Negative.        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  Respiratory: Negative.   Cardiovascular: Negative.   Gastrointestinal: Negative.   Genitourinary: Negative.   Musculoskeletal: Negative.   Skin: Negative.   Neurological: Positive for dizziness (thinks because of his ears. ).  Endo/Heme/Allergies: Negative.   Psychiatric/Behavioral: Negative.     Immunization History  Administered Date(s) Administered  . Influenza, Seasonal, Injecte, Preservative Fre 03/23/2015  . Influenza-Unspecified 03/23/2016  . Pneumococcal Polysaccharide-23 06/03/2012  . Td 09/01/2009  . Zoster 08/02/2005    Objective:  BP 118/72   Pulse 84   Temp 97.9 F (36.6 C)   Wt 218 lb 12.8 oz (99.2 kg)   BMI 29.67 kg/m   Physical Exam  Constitutional: He is oriented to person, place, and time and well-developed, well-nourished, and in no distress.  HENT:  Head: Normocephalic and atraumatic.  Right Ear: External ear normal.  Left Ear: External ear normal.  Nose: Nose normal.  Mouth/Throat: Oropharynx is clear and moist.  Eyes: Conjunctivae and EOM are normal. Pupils are equal, round, and reactive to light.  Neck: Normal range of motion. Neck supple.  Cardiovascular: Normal rate, regular rhythm, normal heart sounds and intact distal  pulses.   Pulmonary/Chest: Effort normal and breath sounds normal.  Abdominal: Soft. Bowel sounds are normal.  Musculoskeletal: Normal range of motion.  Neurological: He is alert and oriented to person, place, and time. He has normal reflexes. Gait normal. GCS score is 15.  Skin: Skin is warm and dry.  Psychiatric: Mood, memory, affect and judgment normal.    Lab Results  Component Value Date   WBC 6.2 04/18/2015   HGB 12.1 (L) 10/28/2013   HCT 37.7 04/18/2015   PLT 168 04/18/2015   GLUCOSE 107 (H) 04/18/2015   CHOL 161 04/18/2015   TRIG 144 04/18/2015   HDL 69 04/18/2015   LDLCALC 63 04/18/2015   TSH 2.390 04/18/2015   INR 1.26  03/23/2011   HGBA1C 5.2 03/22/2011    CMP     Component Value Date/Time   NA 140 04/18/2015 1003   K 4.7 04/18/2015 1003   CL 101 04/18/2015 1003   CO2 24 04/18/2015 1003   GLUCOSE 107 (H) 04/18/2015 1003   GLUCOSE 98 05/12/2014 1215   BUN 14 04/18/2015 1003   CREATININE 1.20 04/18/2015 1003   CREATININE 1.04 05/12/2014 1215   CALCIUM 9.5 04/18/2015 1003   PROT 6.9 04/18/2015 1003   ALBUMIN 4.6 04/18/2015 1003   AST 23 04/18/2015 1003   ALT 32 04/18/2015 1003   ALKPHOS 55 04/18/2015 1003   BILITOT 0.7 04/18/2015 1003   GFRNONAA 61 04/18/2015 1003   GFRAA 70 04/18/2015 1003    Assessment and Plan :  1. Depression, unspecified depression type  - buPROPion (WELLBUTRIN XL) 300 MG 24 hr tablet; Take 1 tablet (300 mg total) by mouth daily.  Dispense: 90 tablet; Refill: 3 - citalopram (CELEXA) 20 MG tablet; Take 1 tablet (20 mg total) by mouth daily.  Dispense: 90 tablet; Refill: 3  2. OSA (obstructive sleep apnea)   3. Primary insomnia   4. Gout of knee, unspecified cause, unspecified chronicity, unspecified laterality  - allopurinol (ZYLOPRIM) 100 MG tablet; Take 1 tablet (100 mg total) by mouth daily.  Dispense: 90 tablet; Refill: 3 - nabumetone (RELAFEN) 500 MG tablet; Take 1 tablet (500 mg total) by mouth 2 (two) times daily.   Dispense: 60 tablet; Refill: 1  5. Hypercholesterolemia   6. Need for pneumococcal vaccination  - Pneumococcal conjugate vaccine 13-valent IM  7. Other insomnia  - clonazePAM (KLONOPIN) 1 MG tablet; Take 1 tablet (1 mg total) by mouth at bedtime as needed (sleep).  Dispense: 30 tablet; Refill: 4  8. Gastroesophageal reflux disease, esophagitis presence not specified  - omeprazole (PRILOSEC) 20 MG capsule; Take 1 capsule (20 mg total) by mouth daily.  Dispense: 90 capsule; Refill: 3 9. Juvenile macular degeneration  I have done the exam and reviewed the chart and it is accurate to the best of my knowledge. [employer]  technology has been used and  any errors in dictation or transcription are unintentional. Julieanne Mansonichard Gilbert M.D. [facility]   Julieanne Mansonichard Gilbert MD [facility] 06/05/2016 9:33 AM

## 2016-06-05 NOTE — Progress Notes (Signed)
Subjective:   Jason Tate is a 71 y.o. male who presents for Medicare Annual/Subsequent preventive examination.  Review of Systems:  N/A  Cardiac Risk Factors include: advanced age (>1355men, 58>65 women);dyslipidemia;sedentary lifestyle     Objective:    Vitals: BP 118/72 (BP Location: Right Arm)   Pulse 84   Temp 97.9 F (36.6 C) (Oral)   Ht 6' (1.829 m)   Wt 218 lb 8 oz (99.1 kg)   BMI 29.63 kg/m   Body mass index is 29.63 kg/m.  Tobacco History  Smoking Status  . Former Smoker  . Packs/day: 1.00  . Years: 20.00  . Types: Cigarettes  . Quit date: 03/28/1980  Smokeless Tobacco  . Never Used     Counseling given: Not Answered   Past Medical History:  Diagnosis Date  . Abnormal stress echo Oct 2012  . CAD (coronary artery disease) October 2012   s/p CABG x 2 per Dr. Cornelius Moraswen  . Hypercholesterolemia   . Low testosterone   . Macular degeneration   . 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 Obesity   . PVC (premature ventricular contraction)   . Sleep apnea, obstructive    on BiPAP   Past Surgical History:  Procedure Laterality Date  . COLONOSCOPY    . CORONARY ARTERY BYPASS GRAFT  Oct 2012   LIMA to LAD and left radial to OM  . HEMORRHOIDECTOMY WITH HEMORRHOID BANDING     Family History  Problem Relation Age of Onset  . Heart disease Mother   . Stroke Father   . Heart disease Father    History  Sexual Activity  . Sexual activity: Yes    Outpatient Encounter Prescriptions as of 06/05/2016  Medication Sig  . allopurinol (ZYLOPRIM) 100 MG tablet Take 1 tablet (100 mg total) by mouth daily.  Marland Kitchen. aspirin 81 MG tablet Take 81 mg by mouth daily.  . B Complex Vitamins (VITAMIN B COMPLEX PO) Take 1 tablet by mouth daily.   Marland Kitchen. buPROPion (WELLBUTRIN XL) 300 MG 24 hr tablet Take 1 tablet (300 mg total) by mouth daily.  . Cetirizine HCl (ZYRTEC ALLERGY PO) Take 1 tablet by mouth daily as needed (allergies).   . Cholecalciferol (VITAMIN D  PO) Take 4,000 mg by mouth daily.    . citalopram (CELEXA) 20 MG tablet Take 1 tablet (20 mg total) by mouth daily.  . clonazePAM (KLONOPIN) 1 MG tablet Take 1 tablet (1 mg total) by mouth at bedtime as needed (sleep).  . Misc Natural Products (OSTEO BI-FLEX JOINT SHIELD PO) Take 1 tablet by mouth daily.   . Multiple Vitamins-Minerals (PRESERVISION/LUTEIN PO) Take 1 tablet by mouth daily.   . nabumetone (RELAFEN) 500 MG tablet Take 500 mg by mouth 2 (two) times daily.  . Omega-3 Fatty Acids (FISH OIL PO) Take 1 tablet by mouth daily.   Marland Kitchen. omeprazole (PRILOSEC) 20 MG capsule Take 1 capsule (20 mg total) by mouth daily.  . simvastatin (ZOCOR) 20 MG tablet Take 1 tablet (20 mg total) by mouth at bedtime.  . fluticasone (FLONASE) 50 MCG/ACT nasal spray Place 1 spray into both nostrils daily as needed for allergies.   . nabumetone (RELAFEN) 750 MG tablet Take 750 mg by mouth 2 (two) times daily.     No facility-administered encounter medications on file as of 06/05/2016.     Activities of Daily Living In your present state of health, do you have any difficulty performing the following activities: 06/05/2016  Hearing? N  Vision? N  Difficulty concentrating or making decisions? N  Walking or climbing stairs? N  Dressing or bathing? N  Doing errands, shopping? Y  Preparing Food and eating ? N  Using the Toilet? N  In the past six months, have you accidently leaked urine? N  Do you have problems with loss of bowel control? N  Managing your Medications? N  Managing your Finances? N  Housekeeping or managing your Housekeeping? N  Some recent data might be hidden    Patient Care Team: [name]., MD as PCP - General (Unknown Physician Specialty) Peter M Swazil[place], MD (Cardiology) Maris Berger, MD as Consulting Physician (Ophthalmology) Edmon Crape, MD as Consulting Physician (Ophthalmology) Sharrell Ku, MD as Consulting Physician (Gastroenterology)   Assessment:     Exercise  Activities and Dietary recommendations Current Exercise Habits: The patient does not participate in regular exercise at present (intends to restart this year), Exercise limited by: None identified  Goals    . Exercise           Starting 06/05/17, I will start exercising 3 days a week for 30 minutes.      Fall Risk Fall Risk  06/05/2016 04/18/2015  Falls in the past year? No No   Depression Screen PHQ 2/9 Scores 06/05/2016 04/18/2015 04/18/2015  PHQ - 2 Score 1 2 1   PHQ- 9 Score - 6 -    Cognitive Function     6CIT Screen 06/05/2016  What Year? 0 points  What month? 0 points  What time? 0 points  Count back from 20 0 points  Months in reverse 0 points  Repeat phrase 0 points  Total Score 0    Immunization History  Administered Date(s) Administered  . Influenza, Seasonal, Injecte, Preservative Fre 03/23/2015  . Pneumococcal Polysaccharide-23 06/03/2012  . Td 09/01/2009  . Zoster 08/02/2005   Screening Tests Health Maintenance  Topic Date Due  . PNA vac Low Risk Adult (2 of 2 - PCV13) 06/03/2013  . Hepatitis C Screening  05/28/2017 (Originally Sep 26, 1945)  . TETANUS/TDAP  09/02/2019  . COLONOSCOPY  12/07/2020  . INFLUENZA VACCINE  Completed  . ZOSTAVAX  Completed      Plan:  I have personally reviewed and addressed the Medicare Annual Wellness questionnaire and have noted the following in the patient's chart:  A. Medical and social history B. Use of alcohol, tobacco or illicit drugs  C. Current medications and supplements D. Functional ability and status E.  Nutritional status F.  Physical activity G. Advance directives H. List of other physicians I.  Hospitalizations, surgeries, and ER visits in previous 12 months J.  Vitals K. Screenings such as hearing and vision if needed, cognitive and depression L. Referrals and appointments - none  In addition, I have reviewed and discussed with patient certain preventive protocols, quality metrics, and best practice  recommendations. A written personalized care plan for preventive services as well as general preventive health recommendations were provided to patient.  See attached scanned questionnaire for additional information.   Signed,  Hyacinth Meeker, LPN Nurse Health Advisor   MD Recommendations: follow up on Prevnar 13 vaccine, pt would like to speak with physician first before receiving. I have reviewed the health advisors note, was  available for consultation and I agree with documentation and plan. Julieanne Manson MD [facility]

## 2016-06-05 NOTE — Patient Instructions (Signed)
Jason Tate , Thank you for taking time to come for your Medicare Wellness Visit. I appreciate your ongoing commitment to your health goals. Please review the following plan we discussed and let me know if I can assist you in the future.   These are the goals we discussed: Goals    . Exercise           Starting 06/05/17, I will start exercising 3 days a week for 30 minutes.       This is a list of the screening recommended for you and due dates:  Health Maintenance  Topic Date Due  .  Hepatitis C: One time screening is recommended by Center for Disease Control  (CDC) for  adults born from [birth year] through 1967.   [birth year]  . Pneumonia vaccines (2 of 2 - PCV13) 06/03/2013  . Flu Shot  12/27/2015  . Tetanus Vaccine  09/02/2019  . Colon Cancer Screening  12/07/2020  . Shingles Vaccine  Completed   Preventive Care for Adults  A healthy lifestyle and preventive care can promote health and wellness. Preventive health guidelines for adults include the following key practices.  . A routine yearly physical is a good way to check with your health care provider about your health and preventive screening. It is a chance to share any concerns and updates on your health and to receive a thorough exam.  . Visit your dentist for a routine exam and preventive care every 6 months. Brush your teeth twice a day and floss once a day. Good oral hygiene prevents tooth decay and gum disease.  . The frequency of eye exams is based on your age, health, family medical history, use  of contact lenses, and other factors. Follow your health care provider's ecommendations for frequency of eye exams.  . Eat a healthy diet. Foods like vegetables, fruits, whole grains, low-fat dairy products, and lean protein foods contain the nutrients you need without too many calories. Decrease your intake of foods high in solid fats, added sugars, and salt. Eat the right amount of calories for you. Get information about a proper diet  from your health care provider, if necessary.  . Regular physical exercise is one of the most important things you can do for your health. Most adults should get at least 150 minutes of moderate-intensity exercise (any activity that increases your heart rate and causes you to sweat) each week. In addition, most adults need muscle-strengthening exercises on 2 or more days a week.  Silver Sneakers may be a benefit available to you. To determine eligibility, you may visit the website: www.silversneakers.com or contact program at 639-041-9117 Mon-Fri between 8AM-8PM.   . Maintain a healthy weight. The body mass index (BMI) is a screening tool to identify possible weight problems. It provides an estimate of body fat based on height and weight. Your health care provider can find your BMI and can help you achieve or maintain a healthy weight.   For adults 20 years and older: ? A BMI below 18.5 is considered underweight. ? A BMI of 18.5 to 24.9 is normal. ? A BMI of 25 to 29.9 is considered overweight. ? A BMI of 30 and above is considered obese.   . Maintain normal blood lipids and cholesterol levels by exercising and minimizing your intake of saturated fat. Eat a balanced diet with plenty of fruit and vegetables. Blood tests for lipids and cholesterol should begin at age 84 and be repeated every 5 years. If your  lipid or cholesterol levels are high, you are over 50, or you are at high risk for heart disease, you may need your cholesterol levels checked more frequently. Ongoing high lipid and cholesterol levels should be treated with medicines if diet and exercise are not working.  . If you smoke, find out from your health care provider how to quit. If you do not use tobacco, please do not start.  . If you choose to drink alcohol, please do not consume more than 2 drinks per day. One drink is considered to be 12 ounces (355 mL) of beer, 5 ounces (148 mL) of wine, or 1.5 ounces (44 mL) of liquor.  .  If you are 6155-424 years old, ask your health care provider if you should take aspirin to prevent strokes.  . Use sunscreen. Apply sunscreen liberally and repeatedly throughout the day. You should seek shade when your shadow is shorter than you. Protect yourself by wearing long sleeves, pants, a wide-brimmed hat, and sunglasses year round, whenever you are outdoors.  . Once a month, do a whole body skin exam, using a mirror to look at the skin on your back. Tell your health care provider of new moles, moles that have irregular borders, moles that are larger than a pencil eraser, or moles that have changed in shape or color.

## 2016-06-10 NOTE — Progress Notes (Deleted)
Jason Tate Date of Birth: [birth year] Medical Record #[id number]  History of Present Illness: Jason Tate is seen for follow up of CAD. He is status post CABG in October 2012. He has a history of second degree heart block that resolved with stopping beta blocker therapy.   Since his last visit he  denies any chest pain or SOB. He walks a lot. He no longer drives due to macular degeneration. He hasn't gone to the gym in the last month and has gained some weight.    Current Outpatient Prescriptions on File Prior to Visit  Medication Sig Dispense Refill  . allopurinol (ZYLOPRIM) 100 MG tablet Take 1 tablet (100 mg total) by mouth daily. 90 tablet 3  . aspirin 81 MG tablet Take 81 mg by mouth daily.    . B Complex Vitamins (VITAMIN B COMPLEX PO) Take 1 tablet by mouth daily.     Marland Kitchen buPROPion (WELLBUTRIN XL) 300 MG 24 hr tablet Take 1 tablet (300 mg total) by mouth daily. 90 tablet 3  . Cetirizine HCl (ZYRTEC ALLERGY PO) Take 1 tablet by mouth daily as needed (allergies).     . Cholecalciferol (VITAMIN D PO) Take 4,000 mg by mouth daily.      . citalopram (CELEXA) 20 MG tablet Take 1 tablet (20 mg total) by mouth daily. 90 tablet 3  . clonazePAM (KLONOPIN) 1 MG tablet Take 1 tablet (1 mg total) by mouth at bedtime as needed (sleep). 30 tablet 4  . fluticasone (FLONASE) 50 MCG/ACT nasal spray Place 1 spray into both nostrils daily as needed for allergies.     . Misc Natural Products (OSTEO BI-FLEX JOINT SHIELD PO) Take 1 tablet by mouth daily.     . Multiple Vitamins-Minerals (PRESERVISION/LUTEIN PO) Take 1 tablet by mouth daily.     . nabumetone (RELAFEN) 500 MG tablet Take 500 mg by mouth 2 (two) times daily.    . nabumetone (RELAFEN) 500 MG tablet Take 1 tablet (500 mg total) by mouth 2 (two) times daily. 60 tablet 1  . Omega-3 Fatty Acids (FISH OIL PO) Take 1 tablet by mouth daily.     Marland Kitchen omeprazole (PRILOSEC) 20 MG capsule Take 1 capsule (20 mg total) by mouth daily. 90 capsule 3  .  simvastatin (ZOCOR) 20 MG tablet Take 1 tablet (20 mg total) by mouth at bedtime. 90 tablet 3   No current facility-administered medications on file prior to visit.     No Known Allergies  Past Medical History:  Diagnosis Date  . Abnormal stress echo Oct 2012  . CAD (coronary artery disease) October 2012   s/p CABG x 2 per Dr. Cornelius Moras  . Hypercholesterolemia   . Low testosterone   . Macular degeneration   . 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 Obesity   . PVC (premature ventricular contraction)   . Sleep apnea, obstructive    on BiPAP    Past Surgical History:  Procedure Laterality Date  . COLONOSCOPY    . CORONARY ARTERY BYPASS GRAFT  Oct 2012   LIMA to LAD and left radial to OM  . HEMORRHOIDECTOMY WITH HEMORRHOID BANDING      History  Smoking Status  . Former Smoker  . Packs/day: 1.00  . Years: 20.00  . Types: Cigarettes  . Quit date: 03/28/1980  Smokeless Tobacco  . Never Used    History  Alcohol Use  . 16.8 oz/week  . 28 Cans of beer per week  Family History  Problem Relation Age of Onset  . Heart disease Mother   . Stroke Father   . Heart disease Father     Review of Systems: The review of systems is per the HPI.  All other systems were reviewed and are negative.  Physical Exam: There were no vitals taken for this visit. Patient is very pleasant and in no acute distress. Skin is warm and dry. Color is normal.  HEENT is unremarkable. Normocephalic/atraumatic. PERRL. Sclera are nonicteric. Neck is supple. No masses. No JVD. Lungs are clear. Cardiac exam shows an irregular rate and rhythm. Abdomen is soft, NT. No masses or bruits.  No edema.  Gait and ROM are intact. No gross neurologic deficits noted.   LABORATORY DATA: Lab Results  Component Value Date   WBC 6.2 04/18/2015   HGB 12.1 (L) 10/28/2013   HCT 37.7 04/18/2015   PLT 168 04/18/2015   GLUCOSE 107 (H) 04/18/2015   CHOL 161 04/18/2015   TRIG 144 04/18/2015    HDL 69 04/18/2015   LDLCALC 63 04/18/2015   ALT 32 04/18/2015   AST 23 04/18/2015   NA 140 04/18/2015   K 4.7 04/18/2015   CL 101 04/18/2015   CREATININE 1.20 04/18/2015   BUN 14 04/18/2015   CO2 24 04/18/2015   TSH 2.390 04/18/2015   INR 1.26 03/23/2011   HGBA1C 5.2 03/22/2011     Assessment / Plan: 1. Coronary disease status post CABG in October 2012. Patient is doing very well. Continue his current medication and exercise regimen  2. Mobitz type 1 second degree AV block. Resolved with stopping metoprolol  3. Hyperlipidemia. Excellent control. Continue Zocor and fish oil.     I will follow up in one year.

## 2016-06-14 ENCOUNTER — Ambulatory Visit: Payer: Medicare Other | Admitting: Cardiology

## 2016-06-22 DIAGNOSIS — H5203 Hypermetropia, bilateral: Secondary | ICD-10-CM | POA: Diagnosis not present

## 2016-06-22 DIAGNOSIS — H3554 Dystrophies primarily involving the retinal pigment epithelium: Secondary | ICD-10-CM | POA: Diagnosis not present

## 2016-07-22 NOTE — Progress Notes (Signed)
[name] Date of Birth: 10/10/1945 Medical Record #[id number]  History of Present Illness: Mr. Boxley is seen for follow up of CAD. He is status post CABG in October 2012. He presented at that time with chest pain and a markedly abnormal stress Echo. He has a history of second degree heart block that resolved with stopping beta blocker therapy. He has a histor of OSA,hypercholesterolemia and gout.    Since his last visit he  denies any chest pain or SOB. He is not exercising as much.  He does have occasional pain in muscles in left upper arm. Not related to exertion.    Current Outpatient Prescriptions on File Prior to Visit  Medication Sig Dispense Refill  . allopurinol (ZYLOPRIM) 100 MG tablet Take 1 tablet (100 mg total) by mouth daily. 90 tablet 3  . aspirin 81 MG tablet Take 81 mg by mouth daily.    . B Complex Vitamins (VITAMIN B COMPLEX PO) Take 1 tablet by mouth daily.     Marland Kitchen buPROPion (WELLBUTRIN XL) 300 MG 24 hr tablet Take 1 tablet (300 mg total) by mouth daily. 90 tablet 3  . Cetirizine HCl (ZYRTEC ALLERGY PO) Take 1 tablet by mouth daily as needed (allergies).     . Cholecalciferol (VITAMIN D PO) Take 4,000 mg by mouth daily.      . citalopram (CELEXA) 20 MG tablet Take 1 tablet (20 mg total) by mouth daily. 90 tablet 3  . clonazePAM (KLONOPIN) 1 MG tablet Take 1 tablet (1 mg total) by mouth at bedtime as needed (sleep). 30 tablet 4  . fluticasone (FLONASE) 50 MCG/ACT nasal spray Place 1 spray into both nostrils daily as needed for allergies.     . Misc Natural Products (OSTEO BI-FLEX JOINT SHIELD PO) Take 1 tablet by mouth daily.     . Multiple Vitamins-Minerals (PRESERVISION/LUTEIN PO) Take 1 tablet by mouth daily.     . nabumetone (RELAFEN) 500 MG tablet Take 500 mg by mouth 2 (two) times daily.    . Omega-3 Fatty Acids (FISH OIL PO) Take 1 tablet by mouth daily.     Marland Kitchen omeprazole (PRILOSEC) 20 MG capsule Take 1 capsule (20 mg total) by mouth daily. 90 capsule 3    No current facility-administered medications on file prior to visit.     No Known Allergies  Past Medical History:  Diagnosis Date  . Abnormal stress echo Oct 2012  . CAD (coronary artery disease) October 2012   s/p CABG x 2 per Dr. Cornelius Moras  . Hypercholesterolemia   . Low testosterone   . Macular degeneration   . 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 Obesity   . PVC (premature ventricular contraction)   . Sleep apnea, obstructive    on BiPAP    Past Surgical History:  Procedure Laterality Date  . COLONOSCOPY    . CORONARY ARTERY BYPASS GRAFT  Oct 2012   LIMA to LAD and left radial to OM  . HEMORRHOIDECTOMY WITH HEMORRHOID BANDING      History  Smoking Status  . Former Smoker  . Packs/day: 1.00  . Years: 20.00  . Types: Cigarettes  . Quit date: 03/28/1980  Smokeless Tobacco  . Never Used    History  Alcohol Use  . 16.8 oz/week  . 28 Cans of beer per week    Family History  Problem Relation Age of Onset  . Heart disease Mother   . Stroke Father   . Heart  disease Father     Review of Systems: The review of systems is per the HPI.  All other systems were reviewed and are negative.  Physical Exam: BP 120/76   Ht 6\' 1"  (1.854 m)   Wt 214 lb 9.6 oz (97.3 kg)   BMI 28.31 kg/m  Patient is very pleasant and in no acute distress. Skin is warm and dry. Color is normal.  HEENT is unremarkable. Normocephalic/atraumatic. PERRL. Sclera are nonicteric. Neck is supple. No masses. No JVD. Lungs are clear. Cardiac exam shows an irregular rate and rhythm. Abdomen is soft, NT. No masses or bruits.  No edema.  Gait and ROM are intact. No gross neurologic deficits noted.   LABORATORY DATA: Lab Results  Component Value Date   WBC 6.2 04/18/2015   HGB 12.1 (L) 10/28/2013   HCT 37.7 04/18/2015   PLT 168 04/18/2015   GLUCOSE 107 (H) 04/18/2015   CHOL 161 04/18/2015   TRIG 144 04/18/2015   HDL 69 04/18/2015   LDLCALC 63 04/18/2015   ALT 32  04/18/2015   AST 23 04/18/2015   NA 140 04/18/2015   K 4.7 04/18/2015   CL 101 04/18/2015   CREATININE 1.20 04/18/2015   BUN 14 04/18/2015   CO2 24 04/18/2015   TSH 2.390 04/18/2015   INR 1.26 03/23/2011   HGBA1C 5.2 03/22/2011   Ecg today shows NSR with rate 94, first degree AV block. Septal infarct age undetermined. I have personally reviewed and interpreted this study.   Assessment / Plan: 1. Coronary disease status post CABG in October 2012. Patient does note some intermittent left arm pain. Initial presentation was not with typical angina. Now > 385yrs from CABG. Will schedule for a stress Myoview.  Continue his current medication. If stress test is OK will need to increase exercise regimen  2. Mobitz type 1 second degree AV block. Resolved with stopping metoprolol  3. Hyperlipidemia. Will scheduled for fasting lab work.  Continue Zocor and fish oil.     I will follow up in one year if stress test is OK.

## 2016-07-23 ENCOUNTER — Ambulatory Visit (INDEPENDENT_AMBULATORY_CARE_PROVIDER_SITE_OTHER): Payer: Medicare Other | Admitting: Cardiology

## 2016-07-23 ENCOUNTER — Encounter: Payer: Self-pay | Admitting: Cardiology

## 2016-07-23 VITALS — BP 120/76 | Ht 73.0 in | Wt 214.6 lb

## 2016-07-23 DIAGNOSIS — E7800 Pure hypercholesterolemia, unspecified: Secondary | ICD-10-CM

## 2016-07-23 DIAGNOSIS — I25810 Atherosclerosis of coronary artery bypass graft(s) without angina pectoris: Secondary | ICD-10-CM | POA: Diagnosis not present

## 2016-07-23 DIAGNOSIS — Z951 Presence of aortocoronary bypass graft: Secondary | ICD-10-CM

## 2016-07-23 DIAGNOSIS — I441 Atrioventricular block, second degree: Secondary | ICD-10-CM | POA: Diagnosis not present

## 2016-07-23 MED ORDER — SIMVASTATIN 20 MG PO TABS: 20.0000 mg | ORAL_TABLET | Freq: Every day | ORAL | 3 refills | Status: DC

## 2016-07-23 NOTE — Patient Instructions (Signed)
We will schedule you for fasting lab work  We will schedule you for nuclear stress test

## 2016-07-27 ENCOUNTER — Telehealth (HOSPITAL_COMMUNITY): Payer: Self-pay

## 2016-07-27 NOTE — Telephone Encounter (Signed)
Encounter complete. 

## 2016-07-31 ENCOUNTER — Ambulatory Visit (HOSPITAL_COMMUNITY)
Admission: RE | Admit: 2016-07-31 | Discharge: 2016-07-31 | Disposition: A | Discharge status: Home / Self Care | Payer: Medicare Other | Source: Ambulatory Visit | Attending: Cardiovascular Disease | Admitting: Cardiovascular Disease

## 2016-07-31 DIAGNOSIS — G4733 Obstructive sleep apnea (adult) (pediatric): Secondary | ICD-10-CM | POA: Diagnosis not present

## 2016-07-31 DIAGNOSIS — Z87891 Personal history of nicotine dependence: Secondary | ICD-10-CM | POA: Diagnosis not present

## 2016-07-31 DIAGNOSIS — R42 Dizziness and giddiness: Secondary | ICD-10-CM | POA: Diagnosis not present

## 2016-07-31 DIAGNOSIS — R9439 Abnormal result of other cardiovascular function study: Secondary | ICD-10-CM | POA: Insufficient documentation

## 2016-07-31 DIAGNOSIS — Z951 Presence of aortocoronary bypass graft: Secondary | ICD-10-CM | POA: Diagnosis not present

## 2016-07-31 DIAGNOSIS — I441 Atrioventricular block, second degree: Secondary | ICD-10-CM | POA: Diagnosis not present

## 2016-07-31 DIAGNOSIS — E7800 Pure hypercholesterolemia, unspecified: Secondary | ICD-10-CM | POA: Insufficient documentation

## 2016-07-31 DIAGNOSIS — Z8249 Family history of ischemic heart disease and other diseases of the circulatory system: Secondary | ICD-10-CM | POA: Diagnosis not present

## 2016-07-31 DIAGNOSIS — I25810 Atherosclerosis of coronary artery bypass graft(s) without angina pectoris: Secondary | ICD-10-CM | POA: Insufficient documentation

## 2016-07-31 DIAGNOSIS — R0609 Other forms of dyspnea: Secondary | ICD-10-CM | POA: Insufficient documentation

## 2016-07-31 LAB — MYOCARDIAL PERFUSION IMAGING
Estimated workload: 7 METS
Exercise duration (min): 5 min
Exercise duration (sec): 45 s
LV dias vol: 134 mL (ref 62–150)
LV sys vol: 70 mL
MPHR: 148 {beats}/min
Peak HR: 131 {beats}/min
Percent HR: 88 %
RPE: 18
Rest HR: 73 {beats}/min
SDS: 0
SRS: 8
SSS: 8
TID: 1.41

## 2016-07-31 MED ORDER — TECHNETIUM TC 99M TETROFOSMIN IV KIT
32.3000 | PACK | Freq: Once | INTRAVENOUS | Status: AC | PRN
  Administered 2016-07-31: 32.3 via INTRAVENOUS
  Filled 2016-07-31: qty 33

## 2016-07-31 MED ORDER — TECHNETIUM TC 99M TETROFOSMIN IV KIT
10.9000 | PACK | Freq: Once | INTRAVENOUS | Status: AC | PRN
  Administered 2016-07-31: 10.9 via INTRAVENOUS
  Filled 2016-07-31: qty 11

## 2016-08-01 DIAGNOSIS — E7800 Pure hypercholesterolemia, unspecified: Secondary | ICD-10-CM | POA: Diagnosis not present

## 2016-08-01 DIAGNOSIS — Z951 Presence of aortocoronary bypass graft: Secondary | ICD-10-CM | POA: Diagnosis not present

## 2016-08-01 DIAGNOSIS — I25810 Atherosclerosis of coronary artery bypass graft(s) without angina pectoris: Secondary | ICD-10-CM | POA: Diagnosis not present

## 2016-08-01 DIAGNOSIS — I441 Atrioventricular block, second degree: Secondary | ICD-10-CM | POA: Diagnosis not present

## 2016-08-01 LAB — BASIC METABOLIC PANEL WITH GFR
BUN: 19 mg/dL (ref 7–25)
CO2: 25 mmol/L (ref 20–31)
Calcium: 9.7 mg/dL (ref 8.6–10.3)
Chloride: 104 mmol/L (ref 98–110)
Creat: 1.16 mg/dL (ref 0.70–1.18)
Glucose, Bld: 104 mg/dL — ABNORMAL HIGH (ref 65–99)
Potassium: 4.9 mmol/L (ref 3.5–5.3)
Sodium: 141 mmol/L (ref 135–146)

## 2016-08-01 LAB — CBC WITH DIFFERENTIAL/PLATELET
Basophils Absolute: 60 {cells}/uL (ref 0–200)
Basophils Relative: 1 %
Eosinophils Absolute: 300 {cells}/uL (ref 15–500)
Eosinophils Relative: 5 %
HCT: 39.4 % (ref 38.5–50.0)
Hemoglobin: 13.2 g/dL (ref 13.2–17.1)
Lymphocytes Relative: 27 %
Lymphs Abs: 1620 {cells}/uL (ref 850–3900)
MCH: 33.2 pg — ABNORMAL HIGH (ref 27.0–33.0)
MCHC: 33.5 g/dL (ref 32.0–36.0)
MCV: 99.2 fL (ref 80.0–100.0)
MPV: 10.1 fL (ref 7.5–12.5)
Monocytes Absolute: 420 {cells}/uL (ref 200–950)
Monocytes Relative: 7 %
Neutro Abs: 3600 {cells}/uL (ref 1500–7800)
Neutrophils Relative %: 60 %
Platelets: 199 10*3/uL (ref 140–400)
RBC: 3.97 MIL/uL — ABNORMAL LOW (ref 4.20–5.80)
RDW: 13.2 % (ref 11.0–15.0)
WBC: 6 10*3/uL (ref 3.8–10.8)

## 2016-08-01 LAB — HEPATIC FUNCTION PANEL
ALT: 34 U/L (ref 9–46)
AST: 25 U/L (ref 10–35)
Albumin: 4.8 g/dL (ref 3.6–5.1)
Alkaline Phosphatase: 52 U/L (ref 40–115)
Bilirubin, Direct: 0.2 mg/dL (ref <=0.2)
Indirect Bilirubin: 0.5 mg/dL (ref 0.2–1.2)
Total Bilirubin: 0.7 mg/dL (ref 0.2–1.2)
Total Protein: 7.4 g/dL (ref 6.1–8.1)

## 2016-08-01 LAB — LIPID PANEL
Cholesterol: 161 mg/dL (ref <200)
HDL: 58 mg/dL (ref >40)
LDL Cholesterol: 68 mg/dL (ref <100)
Total CHOL/HDL Ratio: 2.8 Ratio (ref <5.0)
Triglycerides: 173 mg/dL — ABNORMAL HIGH (ref <150)
VLDL: 35 mg/dL — ABNORMAL HIGH (ref <30)

## 2016-09-05 DIAGNOSIS — H6521 Chronic serous otitis media, right ear: Secondary | ICD-10-CM | POA: Diagnosis not present

## 2016-09-05 DIAGNOSIS — H6122 Impacted cerumen, left ear: Secondary | ICD-10-CM | POA: Diagnosis not present

## 2016-09-05 DIAGNOSIS — H8110 Benign paroxysmal vertigo, unspecified ear: Secondary | ICD-10-CM | POA: Diagnosis not present

## 2016-10-02 ENCOUNTER — Other Ambulatory Visit: Payer: Self-pay | Admitting: Family Medicine

## 2016-10-08 ENCOUNTER — Ambulatory Visit: Payer: Medicare Other | Admitting: Family Medicine

## 2016-10-09 ENCOUNTER — Ambulatory Visit (INDEPENDENT_AMBULATORY_CARE_PROVIDER_SITE_OTHER): Payer: Medicare Other | Admitting: Family Medicine

## 2016-10-09 ENCOUNTER — Encounter: Payer: Self-pay | Admitting: Family Medicine

## 2016-10-09 VITALS — BP 146/70 | HR 98 | Temp 97.7°F | Resp 16 | Wt 219.0 lb

## 2016-10-09 DIAGNOSIS — M1A0220 Idiopathic chronic gout, left elbow, without tophus (tophi): Secondary | ICD-10-CM | POA: Diagnosis not present

## 2016-10-09 DIAGNOSIS — L03114 Cellulitis of left upper limb: Secondary | ICD-10-CM

## 2016-10-09 DIAGNOSIS — I25810 Atherosclerosis of coronary artery bypass graft(s) without angina pectoris: Secondary | ICD-10-CM

## 2016-10-09 MED ORDER — COLCHICINE 0.6 MG PO TABS: 0.6000 mg | ORAL_TABLET | Freq: Two times a day (BID) | ORAL | 3 refills | Status: DC

## 2016-10-09 MED ORDER — INDOMETHACIN 50 MG PO CAPS: 50.0000 mg | ORAL_CAPSULE | Freq: Two times a day (BID) | ORAL | 5 refills | Status: DC

## 2016-10-09 MED ORDER — DOXYCYCLINE HYCLATE 100 MG PO TABS: 100.0000 mg | ORAL_TABLET | Freq: Two times a day (BID) | ORAL | 0 refills | Status: DC

## 2016-10-09 MED ORDER — HYDROCODONE-ACETAMINOPHEN 5-325 MG PO TABS: 1.0000 | ORAL_TABLET | ORAL | 0 refills | Status: DC | PRN

## 2016-10-09 NOTE — Progress Notes (Signed)
Subjective:  HPI Pt is here today for what he thinks is gout in his left elbow. He reports that last Friday he woke up and it felt like he had twisted his elbow, then as the day went by it got worse then when he went to bed it hurt for the sheets to touch it and it knew then it was gout. He has had gout before in his knee and he says it feels similar. It takes Allopurinol 100 mg daily. The elbow if red and swollen. He describes it as sharp pains. He says that this has made him feel bad.   Prior to Admission medications   Medication Sig Start Date End Date Taking? Authorizing Provider  allopurinol (ZYLOPRIM) 100 MG tablet Take 1 tablet (100 mg total) by mouth daily. 06/05/16   [name]., MD  aspirin 81 MG tablet Take 81 mg by mouth daily.    [provider]  B Complex Vitamins (VITAMIN B COMPLEX PO) Take 1 tablet by mouth daily.     [provider]  buPROPion (WELLBUTRIN XL) 300 MG 24 hr tablet Take 1 tablet (300 mg total) by mouth daily. 06/05/16   [name]., MD  Cetirizine HCl (ZYRTEC ALLERGY PO) Take 1 tablet by mouth daily as needed (allergies).     [provider]  Cholecalciferol (VITAMIN D PO) Take 4,000 mg by mouth daily.      [provider]  citalopram (CELEXA) 20 MG tablet Take 1 tablet (20 mg total) by mouth daily. 06/05/16   [name]., MD  clonazePAM (KLONOPIN) 1 MG tablet Take 1 tablet (1 mg total) by mouth at bedtime as needed (sleep). 06/05/16   [name]., MD  fluticasone [facility]) 50 MCG/ACT nasal spray Place 1 spray into both nostrils daily as needed for allergies.  09/04/13   [provider]  Misc Natural Products (OSTEO BI-FLEX JOINT SHIELD PO) Take 1 tablet by mouth daily.     [provider]  Multiple Vitamins-Minerals (PRESERVISION/LUTEIN PO) Take 1 tablet by mouth daily.     [provider]  nabumetone (RELAFEN) 500 MG tablet Take 500 mg by mouth 2 (two) times daily.     [provider]  Omega-3 Fatty Acids (FISH OIL PO) Take 1 tablet by mouth daily.     [provider]  omeprazole (PRILOSEC) 20 MG capsule Take 1 capsule (20 mg total) by mouth daily. 06/05/16   [name]., MD  omeprazole (PRILOSEC) 20 MG capsule TAKE 1 CAPSULE DAILY 10/02/16   [name]., MD  simvastatin (ZOCOR) 20 MG tablet Take 1 tablet (20 mg total) by mouth at bedtime. 07/23/16   [place], Peter M, MD    Patient Active Problem List   Diagnosis Date Noted  . Depression 04/18/2015  . OSA (obstructive sleep apnea) 04/18/2015  . Leg edema, left 11/02/2013  . 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 CAD (coronary artery disease) 04/09/2011  . Gout 04/09/2011  . Fatigue 03/05/2011  . Abnormal ECG 03/05/2011  . Hypercholesterolemia   . PVC (premature ventricular contraction)     Past Medical History:  Diagnosis Date  . Abnormal stress echo Oct 2012  . CAD (coronary artery disease) October 2012   s/p CABG x 2 per Dr. Cornelius Moras  . Hypercholesterolemia   . Low testosterone   . Macular degeneration   . Mobitz  type 1 second degree atrioventricular block 04/04/2012  . Obesity   . PVC (premature ventricular contraction)   . Sleep apnea, obstructive    on BiPAP    Social History   Social History  . Marital status: Married    Spouse name: N/A  . Number of children: 2  . Years of education: N/A   Occupational History  . pharmaceutical rep    Social History Main Topics  . Smoking status: Former Smoker    Packs/day: 1.00    Years: 20.00    Types: Cigarettes    Quit date: 03/28/1980  . Smokeless tobacco: Never Used  . Alcohol use 16.8 oz/week    28 Cans of beer per week  . Drug use: No  . Sexual activity: Yes   Other Topics Concern  . Not on file   Social History Narrative  . No narrative on file    No Known Allergies  Review of Systems  Constitutional: Positive for malaise/fatigue.   HENT: Negative.   Eyes: Negative.   Respiratory: Negative.   Cardiovascular: Negative.   Gastrointestinal: Negative.   Genitourinary: Negative.   Musculoskeletal: Positive for joint pain.  Skin: Negative.   Neurological: Negative.   Endo/Heme/Allergies: Negative.     Immunization History  Administered Date(s) Administered  . Influenza, Seasonal, Injecte, Preservative Fre 03/23/2015  . Influenza-Unspecified 03/23/2016  . Pneumococcal Conjugate-13 06/05/2016  . Pneumococcal Polysaccharide-23 06/03/2012  . Td 09/01/2009  . Zoster 08/02/2005    Objective:  BP (!) 146/70 (BP Location: Right Arm, Patient Position: Sitting, Cuff Size: Normal)   Pulse 98   Temp 97.7 F (36.5 C) (Oral)   Resp 16   Wt 219 lb (99.3 kg)   BMI 28.89 kg/m   Physical Exam  Constitutional: He is well-developed, well-nourished, and in no distress.  Eyes: Conjunctivae and EOM are normal. Pupils are equal, round, and reactive to light.  Neck: Neck supple.  Cardiovascular: Normal rate, regular rhythm, normal heart sounds and intact distal pulses.   Pulmonary/Chest: Effort normal and breath sounds normal.  Musculoskeletal: He exhibits edema (left elbow) and tenderness.  Skin: There is erythema (left elbow).    Lab Results  Component Value Date   WBC 6.0 08/01/2016   HGB 13.2 08/01/2016   HCT 39.4 08/01/2016   PLT 199 08/01/2016   GLUCOSE 104 (H) 08/01/2016   CHOL 161 08/01/2016   TRIG 173 (H) 08/01/2016   HDL 58 08/01/2016   LDLCALC 68 08/01/2016   TSH 2.390 04/18/2015   INR 1.26 03/23/2011   HGBA1C 5.2 03/22/2011    CMP     Component Value Date/Time   NA 141 08/01/2016 1306   NA 140 04/18/2015 1003   K 4.9 08/01/2016 1306   CL 104 08/01/2016 1306   CO2 25 08/01/2016 1306   GLUCOSE 104 (H) 08/01/2016 1306   BUN 19 08/01/2016 1306   BUN 14 04/18/2015 1003   CREATININE 1.16 08/01/2016 1306   CALCIUM 9.7 08/01/2016 1306   PROT 7.4 08/01/2016 1306   PROT 6.9 04/18/2015 1003   ALBUMIN  4.8 08/01/2016 1306   ALBUMIN 4.6 04/18/2015 1003   AST 25 08/01/2016 1306   ALT 34 08/01/2016 1306   ALKPHOS 52 08/01/2016 1306   BILITOT 0.7 08/01/2016 1306   BILITOT 0.7 04/18/2015 1003   GFRNONAA 61 04/18/2015 1003   GFRAA 70 04/18/2015 1003    Assessment and Plan :  1. Chronic gout of left elbow, unspecified cause Pt will call back next week  to let us know how he is feeling.  - CBC with Differential/Platelet - Uric acid - Ambulatory referral to Orthopedic Surgery - colchicine 0.6 MG tablet; Take 1 tablet (0.6 mg total) by mouth 2 (two) times daily.  Dispense: 60 tablet; Refill: 3 - indomethacin (INDOCIN) 50 MG capsule; Take 1 capsule (50 mg total) by mouth 2 (two) times daily with a meal.  Dispense: 60 capsule; Refill: 5 - HYDROcodone-acetaminophen (NORCO) 5-325 MG tablet; Take 1 tablet by mouth every 4 (four) hours as needed for moderate pain.  Dispense: 30 tablet; Refill: 0  2. Cellulitis of left upper extremity  - CBC with Differential/Platelet - doxycycline (VIBRA-TABS) 100 MG tablet; Take 1 tablet (100 mg total) by mouth 2 (two) times daily.  Dispense: 14 tablet; Refill: 0 - HYDROcodone-acetaminophen (NORCO) 5-325 MG tablet; Take 1 tablet by mouth every 4 (four) hours as needed for moderate pain.  Dispense: 30 tablet; Refill: 0   HPI, Exam, and A&P Transcribed under the direction and in the presence of Richard L. Wendelyn BreslowGilbert Jr, MD  Electronically Signed: Silvio PateBrittany O'Dell, CMA I have done the exam and reviewed the above chart and it is accurate to the best of my knowledge. DentistDragon  technology has been used in this note in any air is in the dictation or transcription are unintentional.  Julieanne Mansonichard Gilbert MD [facility] 10/09/2016 3:40 PM

## 2016-10-09 NOTE — Patient Instructions (Addendum)
Stop Simvastatin while taking the Colchicine. Call  [place] next week and let us know how you are feeling.

## 2016-10-10 ENCOUNTER — Telehealth: Payer: Self-pay | Admitting: Family Medicine

## 2016-10-10 ENCOUNTER — Telehealth: Payer: Self-pay

## 2016-10-10 LAB — CBC WITH DIFFERENTIAL/PLATELET
Basophils Absolute: 0 10*3/uL (ref 0.0–0.2)
Basos: 0 %
EOS (ABSOLUTE): 0.1 10*3/uL (ref 0.0–0.4)
Eos: 1 %
Hematocrit: 35.1 % — ABNORMAL LOW (ref 37.5–51.0)
Hemoglobin: 11.7 g/dL — ABNORMAL LOW (ref 13.0–17.7)
Immature Grans (Abs): 0 10*3/uL (ref 0.0–0.1)
Immature Granulocytes: 0 %
Lymphocytes Absolute: 1.2 10*3/uL (ref 0.7–3.1)
Lymphs: 13 %
MCH: 33 pg (ref 26.6–33.0)
MCHC: 33.3 g/dL (ref 31.5–35.7)
MCV: 99 fL — ABNORMAL HIGH (ref 79–97)
Monocytes Absolute: 1.1 10*3/uL — ABNORMAL HIGH (ref 0.1–0.9)
Monocytes: 11 %
Neutrophils Absolute: 6.9 10*3/uL (ref 1.4–7.0)
Neutrophils: 75 %
Platelets: 192 10*3/uL (ref 150–379)
RBC: 3.55 x10E6/uL — ABNORMAL LOW (ref 4.14–5.80)
RDW: 13.2 % (ref 12.3–15.4)
WBC: 9.4 10*3/uL (ref 3.4–10.8)

## 2016-10-10 LAB — URIC ACID: Uric Acid: 7.3 mg/dL (ref 3.7–8.6)

## 2016-10-10 NOTE — Telephone Encounter (Signed)
-----   Message from Maple Hudsonichard L Gilbert Jr., MD sent at 10/10/2016  9:20 AM EDT ----- Labs OK

## 2016-10-10 NOTE — Telephone Encounter (Signed)
Does the patient need to continue Doxy? Please advise. Thanks!

## 2016-10-10 NOTE — Telephone Encounter (Signed)
Advised pt of lab results. Pt verbally acknowledges understanding. Emily Drozdowski, CMA   

## 2016-10-10 NOTE — Telephone Encounter (Signed)
Pt needs to know whether he needs to continue taking the antibiotic for gout .  He said it is better.  He would like a nurse to call him back.  Thanks Fortune Brandsteri

## 2016-10-10 NOTE — Telephone Encounter (Signed)
Will address next week. Should wait until this has resolved.

## 2016-10-10 NOTE — Telephone Encounter (Signed)
Pt was advised uric acid was high normal. Please advise. Allene DillonEmily Drozdowski, CMA

## 2016-10-10 NOTE — Telephone Encounter (Signed)
Pt advised. Emily Drozdowski, CMA  

## 2016-10-10 NOTE — Telephone Encounter (Signed)
Yes for now.

## 2016-10-10 NOTE — Telephone Encounter (Signed)
Pt advised. Jason Tate, CMA  

## 2016-10-10 NOTE — Telephone Encounter (Signed)
Pt states uric acid was at a normal level.He would like to know if he needs to continue taking new meds prescribed for gout

## 2016-10-15 ENCOUNTER — Telehealth: Payer: Self-pay | Admitting: Family Medicine

## 2016-10-15 NOTE — Telephone Encounter (Signed)
Please review-aa 

## 2016-10-15 NOTE — Telephone Encounter (Signed)
Pt states he was told by Dr Sullivan LoneGilbert to call back this week to advise about his elbow.  Pt states the antibiotic has helped his elbow and he is much better.  Pt states the swelling and pain is gone.  Pt is asking if he sould still see the orthopedist on Wednesday.  CB#[phone]

## 2016-10-16 NOTE — Telephone Encounter (Signed)
[id number] 

## 2016-10-16 NOTE — Telephone Encounter (Signed)
He can cancel if it has resolved.

## 2016-10-16 NOTE — Telephone Encounter (Signed)
Patient advised as below.  

## 2016-11-26 DIAGNOSIS — M25561 Pain in right knee: Secondary | ICD-10-CM | POA: Diagnosis not present

## 2016-11-26 DIAGNOSIS — M1711 Unilateral primary osteoarthritis, right knee: Secondary | ICD-10-CM | POA: Diagnosis not present

## 2016-12-04 ENCOUNTER — Ambulatory Visit (INDEPENDENT_AMBULATORY_CARE_PROVIDER_SITE_OTHER): Payer: Medicare Other | Admitting: Family Medicine

## 2016-12-04 ENCOUNTER — Ambulatory Visit: Payer: Medicare Other | Admitting: Family Medicine

## 2016-12-04 ENCOUNTER — Encounter: Payer: Self-pay | Admitting: Family Medicine

## 2016-12-04 DIAGNOSIS — F5101 Primary insomnia: Secondary | ICD-10-CM | POA: Diagnosis not present

## 2016-12-04 DIAGNOSIS — M25561 Pain in right knee: Secondary | ICD-10-CM

## 2016-12-04 DIAGNOSIS — M1A0220 Idiopathic chronic gout, left elbow, without tophus (tophi): Secondary | ICD-10-CM | POA: Diagnosis not present

## 2016-12-04 DIAGNOSIS — I25810 Atherosclerosis of coronary artery bypass graft(s) without angina pectoris: Secondary | ICD-10-CM

## 2016-12-04 DIAGNOSIS — F329 Major depressive disorder, single episode, unspecified: Secondary | ICD-10-CM | POA: Diagnosis not present

## 2016-12-04 DIAGNOSIS — F32A Depression, unspecified: Secondary | ICD-10-CM

## 2016-12-04 DIAGNOSIS — G4733 Obstructive sleep apnea (adult) (pediatric): Secondary | ICD-10-CM

## 2016-12-04 DIAGNOSIS — L03114 Cellulitis of left upper limb: Secondary | ICD-10-CM | POA: Diagnosis not present

## 2016-12-04 MED ORDER — HYDROCODONE-ACETAMINOPHEN 10-325 MG PO TABS: 1.0000 | ORAL_TABLET | ORAL | 0 refills | Status: DC | PRN

## 2016-12-04 NOTE — Progress Notes (Signed)
Subjective:  HPI  Pt I here for a follow up of his cellulitis of his left elbow. He reports that it has completley resolved. However he is having right knee pain. He says he went on vacation and walked around in crocs and flops and then he fell in a gift shop and twisted his knee. He is seeing ortho for this and they have given him an injection and has not helped. He wanted Dr. Elisabeth CaraGilbert's opinion of this. He was given tramadol for the pain and it does not work. He had Hydrocodone 5/325mg  left from his elbow and he said it was better than the tramadol but still did not work great, thinks he needs a larger dose for this.   Pt is also here for a 6 month follow up of chronic problems. For depression, insomnia, and gout. He reports that he is feeling well emotionally and physically other than his knee.   Prior to Admission medications   Medication Sig Start Date End Date Taking? Authorizing Provider  allopurinol (ZYLOPRIM) 100 MG tablet Take 1 tablet (100 mg total) by mouth daily. 06/05/16   Maple HudsonGilbert, Richard L Jr., MD  aspirin 81 MG tablet Take 81 mg by mouth daily.    [provider]  B Complex Vitamins (VITAMIN B COMPLEX PO) Take 1 tablet by mouth daily.     [provider]  buPROPion (WELLBUTRIN XL) 300 MG 24 hr tablet Take 1 tablet (300 mg total) by mouth daily. 06/05/16   Maple HudsonGilbert, Richard L Jr., MD  Cetirizine HCl (ZYRTEC ALLERGY PO) Take 1 tablet by mouth daily as needed (allergies).     [provider]  Cholecalciferol (VITAMIN D PO) Take 4,000 mg by mouth daily.      [provider]  citalopram (CELEXA) 20 MG tablet Take 1 tablet (20 mg total) by mouth daily. 06/05/16   Maple HudsonGilbert, Richard L Jr., MD  clonazePAM (KLONOPIN) 1 MG tablet Take 1 tablet (1 mg total) by mouth at bedtime as needed (sleep). 06/05/16   Maple HudsonGilbert, Richard L Jr., MD  colchicine 0.6 MG tablet Take 1 tablet (0.6 mg total) by mouth 2 (two) times daily. 10/09/16   Maple HudsonGilbert, Richard L Jr., MD    doxycycline (VIBRA-TABS) 100 MG tablet Take 1 tablet (100 mg total) by mouth 2 (two) times daily. 10/09/16   Maple HudsonGilbert, Richard L Jr., MD  fluticasone [facility](FLONASE) 50 MCG/ACT nasal spray Place 1 spray into both nostrils daily as needed for allergies.  09/04/13   [provider]  HYDROcodone-acetaminophen (NORCO) 5-325 MG tablet Take 1 tablet by mouth every 4 (four) hours as needed for moderate pain. 10/09/16   Maple HudsonGilbert, Richard L Jr., MD  indomethacin (INDOCIN) 50 MG capsule Take 1 capsule (50 mg total) by mouth 2 (two) times daily with a meal. 10/09/16   Maple HudsonGilbert, Richard L Jr., MD  Misc Natural Products (OSTEO BI-FLEX JOINT SHIELD PO) Take 1 tablet by mouth daily.     [provider]  Multiple Vitamins-Minerals (PRESERVISION/LUTEIN PO) Take 1 tablet by mouth daily.     [provider]  nabumetone (RELAFEN) 500 MG tablet Take 500 mg by mouth 2 (two) times daily.    [provider]  Omega-3 Fatty Acids (FISH OIL PO) Take 1 tablet by mouth daily.     [provider]  omeprazole (PRILOSEC) 20 MG capsule Take 1 capsule (20 mg total) by mouth daily. 06/05/16   Maple HudsonGilbert, Richard L Jr., MD  omeprazole (PRILOSEC) 20 MG capsule  TAKE 1 CAPSULE DAILY 10/02/16   [name]., MD  simvastatin (ZOCOR) 20 MG tablet Take 1 tablet (20 mg total) by mouth at bedtime. 07/23/16   [place], Peter M, MD    Patient Active Problem List   Diagnosis Date Noted  . Depression 04/18/2015  . OSA (obstructive sleep apnea) 04/18/2015  . Leg edema, left 11/02/2013  . 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 CAD (coronary artery disease) 04/09/2011  . Gout 04/09/2011  . Fatigue 03/05/2011  . Abnormal ECG 03/05/2011  . Hypercholesterolemia   . PVC (premature ventricular contraction)     Past Medical History:  Diagnosis Date  . Abnormal stress echo Oct 2012  . CAD (coronary artery disease) October 2012   s/p CABG x 2 per Dr. Cornelius Moras   . Hypercholesterolemia   . Low testosterone   . Macular degeneration   . 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 Obesity   . PVC (premature ventricular contraction)   . Sleep apnea, obstructive    on BiPAP    Social History   Social History  . Marital status: Married    Spouse name: N/A  . Number of children: 2  . Years of education: N/A   Occupational History  . pharmaceutical rep    Social History Main Topics  . Smoking status: Former Smoker    Packs/day: 1.00    Years: 20.00    Types: Cigarettes    Quit date: 03/28/1980  . Smokeless tobacco: Never Used  . Alcohol use 16.8 oz/week    28 Cans of beer per week  . Drug use: No  . Sexual activity: Yes   Other Topics Concern  . Not on file   Social History Narrative  . No narrative on file    No Known Allergies  Review of Systems  Constitutional: Negative.   HENT: Negative.   Eyes: Negative.   Respiratory: Negative.   Cardiovascular: Negative.   Gastrointestinal: Negative.   Genitourinary: Negative.   Musculoskeletal: Positive for joint pain.  Skin: Negative.   Neurological: Negative.   Endo/Heme/Allergies: Negative.   Psychiatric/Behavioral: Negative.     Immunization History  Administered Date(s) Administered  . Influenza, Seasonal, Injecte, Preservative Fre 03/23/2015  . Influenza-Unspecified 03/23/2016  . Pneumococcal Conjugate-13 06/05/2016  . Pneumococcal Polysaccharide-23 06/03/2012  . Td 09/01/2009  . Zoster 08/02/2005    Objective:  BP (!) 150/80 (BP Location: Left Arm, Patient Position: Sitting, Cuff Size: Normal)   Pulse (!) 106   Temp (!) 97.3 F (36.3 C) (Oral)   Resp 16   Wt 218 lb (98.9 kg)   BMI 28.76 kg/m   Physical Exam  Constitutional: He is oriented to person, place, and time and well-developed, well-nourished, and in no distress.  Eyes: Conjunctivae and EOM are normal. Pupils are equal, round, and reactive to light.  Neck: Normal range of motion.  Neck supple.  Cardiovascular: Normal rate, regular rhythm, normal heart sounds and intact distal pulses.   Pulmonary/Chest: Effort normal and breath sounds normal.  Musculoskeletal: He exhibits tenderness (medial joint line tenderness of right knee. ).  Neurological: He is alert and oriented to person, place, and time. He has normal reflexes. Gait normal. GCS score is 15.  Skin: Skin is warm and dry.  Psychiatric: Mood, memory, affect and judgment normal.    Lab Results  Component Value Date   WBC 9.4 10/09/2016   HGB 11.7 (L)  10/09/2016   HCT 35.1 (L) 10/09/2016   PLT 192 10/09/2016   GLUCOSE 104 (H) 08/01/2016   CHOL 161 08/01/2016   TRIG 173 (H) 08/01/2016   HDL 58 08/01/2016   LDLCALC 68 08/01/2016   TSH 2.390 04/18/2015   INR 1.26 03/23/2011   HGBA1C 5.2 03/22/2011    CMP     Component Value Date/Time   NA 141 08/01/2016 1306   NA 140 04/18/2015 1003   K 4.9 08/01/2016 1306   CL 104 08/01/2016 1306   CO2 25 08/01/2016 1306   GLUCOSE 104 (H) 08/01/2016 1306   BUN 19 08/01/2016 1306   BUN 14 04/18/2015 1003   CREATININE 1.16 08/01/2016 1306   CALCIUM 9.7 08/01/2016 1306   PROT 7.4 08/01/2016 1306   PROT 6.9 04/18/2015 1003   ALBUMIN 4.8 08/01/2016 1306   ALBUMIN 4.6 04/18/2015 1003   AST 25 08/01/2016 1306   ALT 34 08/01/2016 1306   ALKPHOS 52 08/01/2016 1306   BILITOT 0.7 08/01/2016 1306   BILITOT 0.7 04/18/2015 1003   GFRNONAA 61 04/18/2015 1003   GFRAA 70 04/18/2015 1003    Assessment and Plan :  1. Chronic gout of left elbow, unspecified cause Stable.   2. Cellulitis of left upper extremity Resolved.   3. Acute pain of right knee Pt will make follow up appt with ortho. Sprain vs meniscal injury. - HYDROcodone-acetaminophen (NORCO) 10-325 MG tablet; Take 1 tablet by mouth every 4 (four) hours as needed.  Dispense: 35 tablet; Refill: 0  4. Depression, unspecified depression type   5. Primary insomnia   6. OSA (obstructive sleep apnea)   HPI,  Exam, and A&P Transcribed under the direction and in the presence of Richard L. Wendelyn Breslow, MD  Electronically Signed: Silvio Pate, CMA I have done the exam and reviewed the above chart and it is accurate to the best of my knowledge. Dentist has been used in this note in any air is in the dictation or transcription are unintentional.  Julieanne Manson MD [facility] 12/04/2016 4:01 PM

## 2016-12-18 DIAGNOSIS — M25561 Pain in right knee: Secondary | ICD-10-CM | POA: Diagnosis not present

## 2016-12-24 DIAGNOSIS — M1711 Unilateral primary osteoarthritis, right knee: Secondary | ICD-10-CM | POA: Diagnosis not present

## 2016-12-24 DIAGNOSIS — M25561 Pain in right knee: Secondary | ICD-10-CM | POA: Diagnosis not present

## 2017-02-28 DIAGNOSIS — H53433 Sector or arcuate defects, bilateral: Secondary | ICD-10-CM | POA: Diagnosis not present

## 2017-02-28 DIAGNOSIS — H353134 Nonexudative age-related macular degeneration, bilateral, advanced atrophic with subfoveal involvement: Secondary | ICD-10-CM | POA: Diagnosis not present

## 2017-02-28 DIAGNOSIS — H5340 Unspecified visual field defects: Secondary | ICD-10-CM | POA: Diagnosis not present

## 2017-02-28 DIAGNOSIS — H3554 Dystrophies primarily involving the retinal pigment epithelium: Secondary | ICD-10-CM | POA: Diagnosis not present

## 2017-03-04 DIAGNOSIS — Z23 Encounter for immunization: Secondary | ICD-10-CM | POA: Diagnosis not present

## 2017-05-13 ENCOUNTER — Telehealth: Payer: Self-pay | Admitting: Family Medicine

## 2017-06-06 ENCOUNTER — Other Ambulatory Visit: Payer: Self-pay

## 2017-06-06 ENCOUNTER — Ambulatory Visit (INDEPENDENT_AMBULATORY_CARE_PROVIDER_SITE_OTHER): Payer: Medicare Other

## 2017-06-06 ENCOUNTER — Ambulatory Visit (INDEPENDENT_AMBULATORY_CARE_PROVIDER_SITE_OTHER): Payer: Medicare Other | Admitting: Family Medicine

## 2017-06-06 VITALS — BP 130/78 | HR 84 | Temp 97.6°F | Ht 73.0 in | Wt 223.6 lb

## 2017-06-06 VITALS — BP 130/78 | HR 84 | Temp 97.6°F | Resp 16 | Ht 73.0 in | Wt 223.0 lb

## 2017-06-06 DIAGNOSIS — I25810 Atherosclerosis of coronary artery bypass graft(s) without angina pectoris: Secondary | ICD-10-CM | POA: Diagnosis not present

## 2017-06-06 DIAGNOSIS — K219 Gastro-esophageal reflux disease without esophagitis: Secondary | ICD-10-CM | POA: Diagnosis not present

## 2017-06-06 DIAGNOSIS — R5383 Other fatigue: Secondary | ICD-10-CM

## 2017-06-06 DIAGNOSIS — G4709 Other insomnia: Secondary | ICD-10-CM

## 2017-06-06 DIAGNOSIS — E7800 Pure hypercholesterolemia, unspecified: Secondary | ICD-10-CM | POA: Diagnosis not present

## 2017-06-06 DIAGNOSIS — F329 Major depressive disorder, single episode, unspecified: Secondary | ICD-10-CM | POA: Diagnosis not present

## 2017-06-06 DIAGNOSIS — M109 Gout, unspecified: Secondary | ICD-10-CM | POA: Diagnosis not present

## 2017-06-06 DIAGNOSIS — J309 Allergic rhinitis, unspecified: Secondary | ICD-10-CM

## 2017-06-06 DIAGNOSIS — Z1211 Encounter for screening for malignant neoplasm of colon: Secondary | ICD-10-CM | POA: Diagnosis not present

## 2017-06-06 DIAGNOSIS — M1A0220 Idiopathic chronic gout, left elbow, without tophus (tophi): Secondary | ICD-10-CM

## 2017-06-06 DIAGNOSIS — G4733 Obstructive sleep apnea (adult) (pediatric): Secondary | ICD-10-CM | POA: Diagnosis not present

## 2017-06-06 DIAGNOSIS — N529 Male erectile dysfunction, unspecified: Secondary | ICD-10-CM | POA: Diagnosis not present

## 2017-06-06 DIAGNOSIS — Z0000 Encounter for general adult medical examination without abnormal findings: Secondary | ICD-10-CM

## 2017-06-06 DIAGNOSIS — F32A Depression, unspecified: Secondary | ICD-10-CM

## 2017-06-06 LAB — IFOBT (OCCULT BLOOD): IFOBT: NEGATIVE

## 2017-06-06 MED ORDER — CLONAZEPAM 1 MG PO TABS: 1.0000 mg | ORAL_TABLET | Freq: Every evening | ORAL | 4 refills | Status: DC | PRN

## 2017-06-06 MED ORDER — SIMVASTATIN 20 MG PO TABS: 20.0000 mg | ORAL_TABLET | Freq: Every day | ORAL | 3 refills | Status: DC

## 2017-06-06 MED ORDER — OMEPRAZOLE 20 MG PO CPDR: 20.0000 mg | DELAYED_RELEASE_CAPSULE | Freq: Every day | ORAL | 3 refills | Status: DC

## 2017-06-06 MED ORDER — FLUTICASONE PROPIONATE 50 MCG/ACT NA SUSP: 1.0000 | Freq: Every day | NASAL | 12 refills | Status: DC | PRN

## 2017-06-06 MED ORDER — ALLOPURINOL 100 MG PO TABS: 100.0000 mg | ORAL_TABLET | Freq: Every day | ORAL | 3 refills | Status: DC

## 2017-06-06 MED ORDER — NABUMETONE 500 MG PO TABS: 500.0000 mg | ORAL_TABLET | Freq: Two times a day (BID) | ORAL | 3 refills | Status: DC

## 2017-06-06 MED ORDER — CITALOPRAM HYDROBROMIDE 20 MG PO TABS: 20.0000 mg | ORAL_TABLET | Freq: Every day | ORAL | 3 refills | Status: DC

## 2017-06-06 MED ORDER — BUPROPION HCL ER (XL) 300 MG PO TB24: 300.0000 mg | ORAL_TABLET | Freq: Every day | ORAL | 3 refills | Status: DC

## 2017-06-06 MED ORDER — COLCHICINE 0.6 MG PO TABS: 0.6000 mg | ORAL_TABLET | Freq: Two times a day (BID) | ORAL | 3 refills | Status: DC

## 2017-06-06 MED ORDER — SILDENAFIL CITRATE 20 MG PO TABS: ORAL_TABLET | ORAL | 11 refills | Status: DC

## 2017-06-06 NOTE — Progress Notes (Signed)
Subjective:   Jason Tate is a 72 y.o. male who presents for Medicare Annual/Subsequent preventive examination.  Review of Systems:   N/A  Cardiac Risk Factors include: advanced age (>61men, >32 women);dyslipidemia;hypertension;male gender     Objective:    Vitals: BP 130/78 (BP Location: Right Arm)   Pulse 84   Temp 97.6 F (36.4 C) (Oral)   Ht 6\' 1"  (1.854 m)   Wt 223 lb 9.6 oz (101.4 kg)   BMI 29.50 kg/m   Body mass index is 29.5 kg/m.  Advanced Directives 06/06/2017 06/05/2016 08/01/2015  Does Patient Have a Medical Advance Directive? Yes Yes No  Type of Estate agent of [place];Living will Living will;Healthcare Power of Attorney -  Copy of Healthcare Power of Attorney in Chart? No - copy requested No - copy requested -    Tobacco Social History   Tobacco Use  Smoking Status Former Smoker  . Packs/day: 1.00  . Years: 20.00  . Pack years: 20.00  . Types: Cigarettes  . Last attempt to quit: 03/28/1980  . Years since quitting: 37.2  Smokeless Tobacco Never Used     Counseling given: Not Answered   Clinical Intake:  Pre-visit preparation completed: Yes  Pain : No/denies pain Pain Score: 0-No pain     Nutritional Status: BMI 25 -29 Overweight Nutritional Risks: None Diabetes: No  How often do you need to have someone help you when you read instructions, pamphlets, or other written materials from your doctor or pharmacy?: 3 - Sometimes  Interpreter Needed?: No  Information entered by :: [facility], LPN  Past Medical History:  Diagnosis Date  . Abnormal stress echo Oct 2012  . CAD (coronary artery disease) October 2012   s/p CABG x 2 per Dr. Cornelius Moras  . Hypercholesterolemia   . Low testosterone   . Macular degeneration   . 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 Obesity   . PVC (premature ventricular contraction)   . Sleep apnea, obstructive    on BiPAP   Past Surgical History:  Procedure Laterality  Date  . COLONOSCOPY    . CORONARY ARTERY BYPASS GRAFT  Oct 2012   LIMA to LAD and left radial to OM  . HEMORRHOIDECTOMY WITH HEMORRHOID BANDING     Family History  Problem Relation Age of Onset  . Heart disease Mother   . Stroke Father   . Heart disease Father    Social History   Socioeconomic History  . Marital status: Married    Spouse name: None  . Number of children: 2  . Years of education: None  . Highest education level: Bachelor's degree (e.g., BA, AB, BS)  Social Needs  . Financial resource strain: None  . Food insecurity - worry: Never true  . Food insecurity - inability: Never true  . Transportation needs - medical: No  . Transportation needs - non-medical: No  Occupational History  . Occupation: Designer, television/film set    Comment: retired  Tobacco Use  . Smoking status: Former Smoker    Packs/day: 1.00    Years: 20.00    Pack years: 20.00    Types: Cigarettes    Last attempt to quit: 03/28/1980    Years since quitting: 37.2  . Smokeless tobacco: Never Used  Substance and Sexual Activity  . Alcohol use: Yes    Alcohol/week: 16.8 oz    Types: 28 Cans of beer per week  . Drug use: No  . Sexual activity: Yes  Other Topics Concern  . None  Social History Narrative   I son deceased- sucicide    Outpatient Encounter Medications as of 06/06/2017  Medication Sig  . allopurinol (ZYLOPRIM) 100 MG tablet Take 1 tablet (100 mg total) by mouth daily.  Marland Kitchen aspirin 81 MG tablet Take 81 mg by mouth daily.  . B Complex Vitamins (VITAMIN B COMPLEX PO) Take 1 tablet by mouth daily.   Marland Kitchen buPROPion (WELLBUTRIN XL) 300 MG 24 hr tablet Take 1 tablet (300 mg total) by mouth daily.  . Cetirizine HCl (ZYRTEC ALLERGY PO) Take 1 tablet by mouth daily as needed (allergies).   . Cholecalciferol (VITAMIN D PO) Take 4,000 mg by mouth daily.    . citalopram (CELEXA) 20 MG tablet Take 1 tablet (20 mg total) by mouth daily.  . clonazePAM (KLONOPIN) 1 MG tablet Take 1 tablet (1 mg total) by  mouth at bedtime as needed (sleep).  . fluticasone (FLONASE) 50 MCG/ACT nasal spray Place 1 spray into both nostrils daily as needed for allergies.   Marland Kitchen FLUZONE HIGH-DOSE 0.5 ML injection TO BE ADMINISTERED BY PHARMACIST FOR IMMUNIZATION  . Misc Natural Products (OSTEO BI-FLEX JOINT SHIELD PO) Take 1 tablet by mouth daily.   . Multiple Vitamins-Minerals (PRESERVISION/LUTEIN PO) Take 1 tablet by mouth daily.   . nabumetone (RELAFEN) 500 MG tablet Take 500 mg by mouth 2 (two) times daily.  . Omega-3 Fatty Acids (FISH OIL PO) Take 1 tablet by mouth daily.   Marland Kitchen omeprazole (PRILOSEC) 20 MG capsule Take 1 capsule (20 mg total) by mouth daily.  . simvastatin (ZOCOR) 20 MG tablet Take 1 tablet (20 mg total) by mouth at bedtime.  . colchicine 0.6 MG tablet Take 1 tablet (0.6 mg total) by mouth 2 (two) times daily. (Patient not taking: Reported on 12/04/2016)  . [DISCONTINUED] HYDROcodone-acetaminophen (NORCO) 10-325 MG tablet Take 1 tablet by mouth every 4 (four) hours as needed.  . [DISCONTINUED] omeprazole (PRILOSEC) 20 MG capsule TAKE 1 CAPSULE DAILY  . [DISCONTINUED] traMADol (ULTRAM) 50 MG tablet    No facility-administered encounter medications on file as of 06/06/2017.     Activities of Daily Living In your present state of health, do you have any difficulty performing the following activities: 06/06/2017  Hearing? N  Vision? Y  Comment Due to MD  Difficulty concentrating or making decisions? N  Walking or climbing stairs? N  Dressing or bathing? N  Doing errands, shopping? Y  Comment Due to MD  Preparing Food and eating ? N  Using the Toilet? N  In the past six months, have you accidently leaked urine? N  Do you have problems with loss of bowel control? N  Managing your Medications? N  Managing your Finances? N  Housekeeping or managing your Housekeeping? N  Some recent data might be hidden    Patient Care Team: [name]., MD as PCP - General (Unknown Physician  Specialty) [place], Peter M, MD (Cardiology) Maris Berger, MD as Consulting Physician (Ophthalmology) Luciana Axe Alford Highland, MD as Consulting Physician (Ophthalmology) Sharrell Ku, MD as Consulting Physician (Gastroenterology)   Assessment:   This is a routine wellness examination for [place].  Exercise Activities and Dietary recommendations Current Exercise Habits: The patient does not participate in regular exercise at present, Exercise limited by: None identified  Goals    . Exercise 3x per week (30 min per time)     Pt plans to start back walking 3 days a week for at least 30 minutes.  Fall Risk Fall Risk  06/06/2017 06/05/2016 04/18/2015  Falls in the past year? Yes No No  Comment tripped - -  Number falls in past yr: 1 - -  Injury with Fall? Yes - -  Comment tore cartiledge in right knee - -  Follow up Falls prevention discussed - -   Is the patient's home free of loose throw rugs in walkways, pet beds, electrical cords, etc?   yes      Grab bars in the bathroom? yes      Handrails on the stairs?   yes      Adequate lighting?   yes  Timed Get Up and Go Performed: N/A  Depression Screen PHQ 2/9 Scores 06/06/2017 06/05/2016 04/18/2015 04/18/2015  PHQ - 2 Score 4 1 2 1   PHQ- 9 Score 6 - 6 -    Cognitive Function: Pt declined screening today.     6CIT Screen 06/05/2016  What Year? 0 points  What month? 0 points  What time? 0 points  Count back from 20 0 points  Months in reverse 0 points  Repeat phrase 0 points  Total Score 0    Immunization History  Administered Date(s) Administered  . Influenza, Seasonal, Injecte, Preservative Fre 03/23/2015  . Influenza-Unspecified 03/23/2016  . Pneumococcal Conjugate-13 06/05/2016  . Pneumococcal Polysaccharide-23 06/03/2012  . Td 09/01/2009  . Zoster 08/02/2005    Qualifies for Shingles Vaccine? Due for Shingles vaccine. Declined my offer to administer today. Education has been provided regarding the importance of  this vaccine. Pt has been advised to call her insurance company to determine her out of pocket expense. Advised she may also receive this vaccine at her local pharmacy or Health Dept. Verbalized acceptance and understanding.  Screening Tests Health Maintenance  Topic Date Due  . Hepatitis C Screening  [birth year]  . TETANUS/TDAP  09/02/2019  . COLONOSCOPY  12/07/2020  . INFLUENZA VACCINE  Completed  . PNA vac Low Risk Adult  Completed   Cancer Screenings: Lung: Low Dose CT Chest recommended if Age 7-80 years, 30 pack-year currently smoking OR have quit w/in 15years. Patient does not qualify. Colorectal: Up to date  Additional Screenings:  Hepatitis B/HIV/Syphillis: Pt declines today.  Hepatitis C Screening: Pt declines today.     Plan:  I have personally reviewed and addressed the Medicare Annual Wellness questionnaire and have noted the following in the patient's chart:  A. Medical and social history B. Use of alcohol, tobacco or illicit drugs  C. Current medications and supplements D. Functional ability and status E.  Nutritional status F.  Physical activity G. Advance directives H. List of other physicians I.  Hospitalizations, surgeries, and ER visits in previous 12 months J.  Vitals K. Screenings such as hearing and vision if needed, cognitive and depression L. Referrals and appointments - none  In addition, I have reviewed and discussed with patient certain preventive protocols, quality metrics, and best practice recommendations. A written personalized care plan for preventive services as well as general preventive health recommendations were provided to patient.  See attached scanned questionnaire for additional information.   Signed,  Hyacinth MeekerMckenzie Reathel Turi, LPN Nurse Health Advisor   Nurse Recommendations: Pt declined the Hepatitis C screening today.

## 2017-06-06 NOTE — Progress Notes (Signed)
Jason Tate  MRN: [id number] DOB: 12/22/1945  Subjective:  HPI   The patient is a 72 year old male who presents for follow up of chronic illness after being seen for his annual wellness by the nurse health advisor.  He was last seen on 12/04/16 for gout, cellulitis and acute pain of his knee.  His chronic issues include depression, hypercholesterolemia, insomnia and OSA.  He is followed by Cardiology for his CAD and heart disease.  Immunization History  Administered Date(s) Administered  . Influenza, Seasonal, Injecte, Preservative Fre 03/23/2015  . Influenza-Unspecified 03/23/2016  . Pneumococcal Conjugate-13 06/05/2016  . Pneumococcal Polysaccharide-23 06/03/2012  . Td 09/01/2009  . Zoster 08/02/2005   12/08/15 Colonoscopy, Dr. Theotis Barrio, repeat 5 years.   Patient Active Problem List   Diagnosis Date Noted  . Depression 04/18/2015  . OSA (obstructive sleep apnea) 04/18/2015  . Leg edema, left 11/02/2013  . 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 CAD (coronary artery disease) 04/09/2011  . Gout 04/09/2011  . Fatigue 03/05/2011  . Abnormal ECG 03/05/2011  . Hypercholesterolemia   . PVC (premature ventricular contraction)     Past Medical History:  Diagnosis Date  . Abnormal stress echo Oct 2012  . CAD (coronary artery disease) October 2012   s/p CABG x 2 per Dr. Cornelius Moras  . Hypercholesterolemia   . Low testosterone   . Macular degeneration   . 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 Obesity   . PVC (premature ventricular contraction)   . Sleep apnea, obstructive    on BiPAP    Social History   Socioeconomic History  . Marital status: Married    Spouse name: Not on file  . Number of children: 2  . Years of education: Not on file  . Highest education level: Bachelor's degree (e.g., BA, AB, BS)  Social Needs  . Financial resource strain: Not on file  . Food insecurity -  worry: Never true  . Food insecurity - inability: Never true  . Transportation needs - medical: No  . Transportation needs - non-medical: No  Occupational History  . Occupation: Designer, television/film set    Comment: retired  Tobacco Use  . Smoking status: Former Smoker    Packs/day: 1.00    Years: 20.00    Pack years: 20.00    Types: Cigarettes    Last attempt to quit: 03/28/1980    Years since quitting: 37.2  . Smokeless tobacco: Never Used  Substance and Sexual Activity  . Alcohol use: Yes    Alcohol/week: 16.8 oz    Types: 28 Cans of beer per week  . Drug use: No  . Sexual activity: Yes  Other Topics Concern  . Not on file  Social History Narrative   I son deceased- sucicide    Outpatient Encounter Medications as of 06/06/2017  Medication Sig Note  . allopurinol (ZYLOPRIM) 100 MG tablet Take 1 tablet (100 mg total) by mouth daily.   Marland Kitchen aspirin 81 MG tablet Take 81 mg by mouth daily.   . B Complex Vitamins (VITAMIN B COMPLEX PO) Take 1 tablet by mouth daily.    Marland Kitchen buPROPion (WELLBUTRIN XL) 300 MG 24 hr tablet Take 1 tablet (300 mg total) by mouth daily.   . Cetirizine HCl (ZYRTEC ALLERGY PO) Take 1 tablet by mouth daily as needed (allergies).    . Cholecalciferol (VITAMIN D PO) Take 4,000 mg by mouth daily.     Marland Kitchen  citalopram (CELEXA) 20 MG tablet Take 1 tablet (20 mg total) by mouth daily.   . clonazePAM (KLONOPIN) 1 MG tablet Take 1 tablet (1 mg total) by mouth at bedtime as needed (sleep).   . colchicine 0.6 MG tablet Take 1 tablet (0.6 mg total) by mouth 2 (two) times daily.   . fluticasone (FLONASE) 50 MCG/ACT nasal spray Place 1 spray into both nostrils daily as needed for allergies.  10/28/2013: Received from: External Pharmacy  . FLUZONE HIGH-DOSE 0.5 ML injection TO BE ADMINISTERED BY PHARMACIST FOR IMMUNIZATION   . Misc Natural Products (OSTEO BI-FLEX JOINT SHIELD PO) Take 1 tablet by mouth daily.    . Multiple Vitamins-Minerals (PRESERVISION/LUTEIN PO) Take 1 tablet by mouth  daily.    . nabumetone (RELAFEN) 500 MG tablet Take 500 mg by mouth 2 (two) times daily.   . Omega-3 Fatty Acids (FISH OIL PO) Take 1 tablet by mouth daily.    Marland Kitchen. omeprazole (PRILOSEC) 20 MG capsule Take 1 capsule (20 mg total) by mouth daily.   . simvastatin (ZOCOR) 20 MG tablet Take 1 tablet (20 mg total) by mouth at bedtime.    No facility-administered encounter medications on file as of 06/06/2017.     No Known Allergies  Review of Systems  Constitutional: Negative.   HENT: Negative.   Eyes: Negative.   Respiratory: Negative.   Cardiovascular: Negative.   Gastrointestinal: Negative.   Genitourinary: Negative.   Musculoskeletal: Negative.   Skin: Negative.   Neurological: Positive for dizziness (Patient thinks it is related to sinuses.  ).  Endo/Heme/Allergies: Negative.   Psychiatric/Behavioral: Negative.     Objective:  BP 130/78   Pulse 84   Temp 97.6 F (36.4 C) (Oral)   Resp 16   Ht 6\' 1"  (1.854 m)   Wt 223 lb (101.2 kg)   BMI 29.42 kg/m   Physical Exam  Constitutional: He is oriented to person, place, and time and well-developed, well-nourished, and in no distress.  HENT:  Head: Normocephalic and atraumatic.  Right Ear: External ear normal.  Left Ear: External ear normal.  Nose: Nose normal.  Mouth/Throat: Oropharynx is clear and moist.  Eyes: Conjunctivae and EOM are normal. Pupils are equal, round, and reactive to light.  Neck: Normal range of motion. Neck supple.  Cardiovascular: Normal rate, regular rhythm, normal heart sounds and intact distal pulses.  Pulmonary/Chest: Effort normal and breath sounds normal.  Abdominal: Soft. Bowel sounds are normal.  Genitourinary: Rectum normal, prostate normal and penis normal.  Musculoskeletal: Normal range of motion.  Neurological: He is alert and oriented to person, place, and time. Gait normal. GCS score is 15.  Skin: Skin is warm and dry.  Psychiatric: Mood, memory, affect and judgment normal.    Assessment  and Plan :  1. Hypercholesterolemia   2. Depression, unspecified depression type  - CBC with Differential/Platelet - Comprehensive metabolic panel - TSH - buPROPion (WELLBUTRIN XL) 300 MG 24 hr tablet; Take 1 tablet (300 mg total) by mouth daily.  Dispense: 90 tablet; Refill: 3 - citalopram (CELEXA) 20 MG tablet; Take 1 tablet (20 mg total) by mouth daily.  Dispense: 90 tablet; Refill: 3  3. Fatigue, unspecified type  - CBC with Differential/Platelet - Comprehensive metabolic panel - TSH  4. Gout of knee, unspecified cause, unspecified chronicity, unspecified laterality  - allopurinol (ZYLOPRIM) 100 MG tablet; Take 1 tablet (100 mg total) by mouth daily.  Dispense: 90 tablet; Refill: 3  5. Chronic gout of left elbow, unspecified cause  -  colchicine 0.6 MG tablet; Take 1 tablet (0.6 mg total) by mouth 2 (two) times daily.  Dispense: 60 tablet; Refill: 3 - nabumetone (RELAFEN) 500 MG tablet; Take 1 tablet (500 mg total) by mouth 2 (two) times daily.  Dispense: 60 tablet; Refill: 3  6. Gastroesophageal reflux disease, esophagitis presence not specified  - omeprazole (PRILOSEC) 20 MG capsule; Take 1 capsule (20 mg total) by mouth daily.  Dispense: 90 capsule; Refill: 3  7. Coronary artery disease involving coronary bypass graft of native heart without angina pectoris S/p CABG 2012 - simvastatin (ZOCOR) 20 MG tablet; Take 1 tablet (20 mg total) by mouth at bedtime.  Dispense: 90 tablet; Refill: 3  8. Other insomnia  - clonazePAM (KLONOPIN) 1 MG tablet; Take 1 tablet (1 mg total) by mouth at bedtime as needed (sleep).  Dispense: 30 tablet; Refill: 4  9. Allergic rhinitis, unspecified seasonality, unspecified trigger  - fluticasone (FLONASE) 50 MCG/ACT nasal spray; Place 1 spray into both nostrils daily as needed for allergies.  Dispense: 16 g; Refill: 12  10. Erectile dysfunction, unspecified erectile dysfunction type  - sildenafil (REVATIO) 20 MG tablet; 1 - 5 PO daily as  needed  Dispense: 50 tablet; Refill: 11  11. Colon cancer screening  - IFOBT POC (occult bld, rslt in office); Future - IFOBT POC (occult bld, rslt in office)  I have done the exam and reviewed the chart and it is accurate to the best of my knowledge. Dentist has been used and  any errors in dictation or transcription are unintentional. Julieanne Manson M.D. [facility]

## 2017-06-06 NOTE — Patient Instructions (Signed)
[name] , Thank you for taking time to come for your Medicare Wellness Visit. I appreciate your ongoing commitment to your health goals. Please review the following plan we discussed and let me know if I can assist you in the future.   Screening recommendations/referrals: Colonoscopy: Up to date Recommended yearly ophthalmology/optometry visit for glaucoma screening and checkup Recommended yearly dental visit for hygiene and checkup  Vaccinations: Influenza vaccine: Up to date Pneumococcal vaccine: Up to date Tdap vaccine: Up to date Shingles vaccine: Pt declines today.     Advanced directives: Please bring a copy of your POA (Power of Attorney) and/or Living Will to your next appointment.   Conditions/risks identified: Fall risk prevenetion; Pt plans to start back walking 3 days a week for at least 30 minutes.  Next appointment: 2:00 PM today   Preventive Care 65 Years and Older, Male Preventive care refers to lifestyle choices and visits with your health care provider that can promote health and wellness. What does preventive care include?  A yearly physical exam. This is also called an annual well check.  Dental exams once or twice a year.  Routine eye exams. Ask your health care provider how often you should have your eyes checked.  Personal lifestyle choices, including:  Daily care of your teeth and gums.  Regular physical activity.  Eating a healthy diet.  Avoiding tobacco and drug use.  Limiting alcohol use.  Practicing safe sex.  Taking low doses of aspirin every day.  Taking vitamin and mineral supplements as recommended by your health care provider. What happens during an annual well check? The services and screenings done by your health care provider during your annual well check will depend on your age, overall health, lifestyle risk factors, and family history of disease. Counseling  Your health care provider may ask you questions about  your:  Alcohol use.  Tobacco use.  Drug use.  Emotional well-being.  Home and relationship well-being.  Sexual activity.  Eating habits.  History of falls.  Memory and ability to understand (cognition).  Work and work Astronomerenvironment. Screening  You may have the following tests or measurements:  Height, weight, and BMI.  Blood pressure.  Lipid and cholesterol levels. These may be checked every 5 years, or more frequently if you are over 74 years old.  Skin check.  Lung cancer screening. You may have this screening every year starting at age 74 if you have a 30-pack-year history of smoking and currently smoke or have quit within the past 15 years.  Fecal occult blood test (FOBT) of the stool. You may have this test every year starting at age 74.  Flexible sigmoidoscopy or colonoscopy. You may have a sigmoidoscopy every 5 years or a colonoscopy every 10 years starting at age 74.  Prostate cancer screening. Recommendations will vary depending on your family history and other risks.  Hepatitis C blood test.  Hepatitis B blood test.  Sexually transmitted disease (STD) testing.  Diabetes screening. This is done by checking your blood sugar (glucose) after you have not eaten for a while (fasting). You may have this done every 1-3 years.  Abdominal aortic aneurysm (AAA) screening. You may need this if you are a current or former smoker.  Osteoporosis. You may be screened starting at age 74 if you are at high risk. Talk with your health care provider about your test results, treatment options, and if necessary, the need for more tests. Vaccines  Your health care provider may recommend  certain vaccines, such as:  Influenza vaccine. This is recommended every year.  Tetanus, diphtheria, and acellular pertussis (Tdap, Td) vaccine. You may need a Td booster every 10 years.  Zoster vaccine. You may need this after age 40.  Pneumococcal 13-valent conjugate (PCV13) vaccine.  One dose is recommended after age 73.  Pneumococcal polysaccharide (PPSV23) vaccine. One dose is recommended after age 47. Talk to your health care provider about which screenings and vaccines you need and how often you need them. This information is not intended to replace advice given to you by your health care provider. Make sure you discuss any questions you have with your health care provider. Document Released: 06/10/2015 Document Revised: 02/01/2016 Document Reviewed: 03/15/2015 Elsevier Interactive Patient Education  2017 [place] Prevention in the Home Falls can cause injuries. They can happen to people of all ages. There are many things you can do to make your home safe and to help prevent falls. What can I do on the outside of my home?  Regularly fix the edges of walkways and driveways and fix any cracks.  Remove anything that might make you trip as you walk through a door, such as a raised step or threshold.  Trim any bushes or trees on the path to your home.  Use bright outdoor lighting.  Clear any walking paths of anything that might make someone trip, such as rocks or tools.  Regularly check to see if handrails are loose or broken. Make sure that both sides of any steps have handrails.  Any raised decks and porches should have guardrails on the edges.  Have any leaves, snow, or ice cleared regularly.  Use sand or salt on walking paths during winter.  Clean up any spills in your garage right away. This includes oil or grease spills. What can I do in the bathroom?  Use night lights.  Install grab bars by the toilet and in the tub and shower. Do not use towel bars as grab bars.  Use non-skid mats or decals in the tub or shower.  If you need to sit down in the shower, use a plastic, non-slip stool.  Keep the floor dry. Clean up any water that spills on the floor as soon as it happens.  Remove soap buildup in the tub or shower regularly.  Attach bath  mats securely with double-sided non-slip rug tape.  Do not have throw rugs and other things on the floor that can make you trip. What can I do in the bedroom?  Use night lights.  Make sure that you have a light by your bed that is easy to reach.  Do not use any sheets or blankets that are too big for your bed. They should not hang down onto the floor.  Have a firm chair that has side arms. You can use this for support while you get dressed.  Do not have throw rugs and other things on the floor that can make you trip. What can I do in the kitchen?  Clean up any spills right away.  Avoid walking on wet floors.  Keep items that you use a lot in easy-to-reach places.  If you need to reach something above you, use a strong step stool that has a grab bar.  Keep electrical cords out of the way.  Do not use floor polish or wax that makes floors slippery. If you must use wax, use non-skid floor wax.  Do not have throw rugs and other  things on the floor that can make you trip. What can I do with my stairs?  Do not leave any items on the stairs.  Make sure that there are handrails on both sides of the stairs and use them. Fix handrails that are broken or loose. Make sure that handrails are as long as the stairways.  Check any carpeting to make sure that it is firmly attached to the stairs. Fix any carpet that is loose or worn.  Avoid having throw rugs at the top or bottom of the stairs. If you do have throw rugs, attach them to the floor with carpet tape.  Make sure that you have a light switch at the top of the stairs and the bottom of the stairs. If you do not have them, ask someone to add them for you. What else can I do to help prevent falls?  Wear shoes that:  Do not have high heels.  Have rubber bottoms.  Are comfortable and fit you well.  Are closed at the toe. Do not wear sandals.  If you use a stepladder:  Make sure that it is fully opened. Do not climb a closed  stepladder.  Make sure that both sides of the stepladder are locked into place.  Ask someone to hold it for you, if possible.  Clearly mark and make sure that you can see:  Any grab bars or handrails.  First and last steps.  Where the edge of each step is.  Use tools that help you move around (mobility aids) if they are needed. These include:  Canes.  Walkers.  Scooters.  Crutches.  Turn on the lights when you go into a dark area. Replace any light bulbs as soon as they burn out.  Set up your furniture so you have a clear path. Avoid moving your furniture around.  If any of your floors are uneven, fix them.  If there are any pets around you, be aware of where they are.  Review your medicines with your doctor. Some medicines can make you feel dizzy. This can increase your chance of falling. Ask your doctor what other things that you can do to help prevent falls. This information is not intended to replace advice given to you by your health care provider. Make sure you discuss any questions you have with your health care provider. Document Released: 03/10/2009 Document Revised: 10/20/2015 Document Reviewed: 06/18/2014 Elsevier Interactive Patient Education  2017 [employer].

## 2017-06-24 DIAGNOSIS — Z961 Presence of intraocular lens: Secondary | ICD-10-CM | POA: Diagnosis not present

## 2017-06-24 DIAGNOSIS — H52203 Unspecified astigmatism, bilateral: Secondary | ICD-10-CM | POA: Diagnosis not present

## 2017-06-24 DIAGNOSIS — H524 Presbyopia: Secondary | ICD-10-CM | POA: Diagnosis not present

## 2017-06-24 DIAGNOSIS — H26493 Other secondary cataract, bilateral: Secondary | ICD-10-CM | POA: Diagnosis not present

## 2017-06-26 DIAGNOSIS — R5383 Other fatigue: Secondary | ICD-10-CM | POA: Diagnosis not present

## 2017-06-26 DIAGNOSIS — F329 Major depressive disorder, single episode, unspecified: Secondary | ICD-10-CM | POA: Diagnosis not present

## 2017-06-27 ENCOUNTER — Telehealth: Payer: Self-pay

## 2017-06-27 LAB — COMPREHENSIVE METABOLIC PANEL
ALT: 30 IU/L (ref 0–44)
AST: 21 IU/L (ref 0–40)
Albumin/Globulin Ratio: 2.2 (ref 1.2–2.2)
Albumin: 4.8 g/dL (ref 3.5–4.8)
Alkaline Phosphatase: 57 IU/L (ref 39–117)
BUN/Creatinine Ratio: 10 (ref 10–24)
BUN: 13 mg/dL (ref 8–27)
Bilirubin Total: 0.6 mg/dL (ref 0.0–1.2)
CO2: 22 mmol/L (ref 20–29)
Calcium: 9.5 mg/dL (ref 8.6–10.2)
Chloride: 104 mmol/L (ref 96–106)
Creatinine, Ser: 1.25 mg/dL (ref 0.76–1.27)
GFR calc Af Amer: 66 mL/min/{1.73_m2} (ref >59)
GFR calc non Af Amer: 57 mL/min/{1.73_m2} — ABNORMAL LOW (ref >59)
Globulin, Total: 2.2 g/dL (ref 1.5–4.5)
Glucose: 101 mg/dL — ABNORMAL HIGH (ref 65–99)
Potassium: 4.6 mmol/L (ref 3.5–5.2)
Sodium: 142 mmol/L (ref 134–144)
Total Protein: 7 g/dL (ref 6.0–8.5)

## 2017-06-27 LAB — CBC WITH DIFFERENTIAL/PLATELET
Basophils Absolute: 0 10*3/uL (ref 0.0–0.2)
Basos: 0 %
EOS (ABSOLUTE): 0.2 10*3/uL (ref 0.0–0.4)
Eos: 3 %
Hematocrit: 36 % — ABNORMAL LOW (ref 37.5–51.0)
Hemoglobin: 12.1 g/dL — ABNORMAL LOW (ref 13.0–17.7)
Immature Grans (Abs): 0 10*3/uL (ref 0.0–0.1)
Immature Granulocytes: 0 %
Lymphocytes Absolute: 1.7 10*3/uL (ref 0.7–3.1)
Lymphs: 25 %
MCH: 33.2 pg — ABNORMAL HIGH (ref 26.6–33.0)
MCHC: 33.6 g/dL (ref 31.5–35.7)
MCV: 99 fL — ABNORMAL HIGH (ref 79–97)
Monocytes Absolute: 0.4 10*3/uL (ref 0.1–0.9)
Monocytes: 5 %
Neutrophils Absolute: 4.4 10*3/uL (ref 1.4–7.0)
Neutrophils: 67 %
Platelets: 186 10*3/uL (ref 150–379)
RBC: 3.64 x10E6/uL — ABNORMAL LOW (ref 4.14–5.80)
RDW: 13.1 % (ref 12.3–15.4)
WBC: 6.7 10*3/uL (ref 3.4–10.8)

## 2017-06-27 LAB — TSH: TSH: 2.2 u[IU]/mL (ref 0.450–4.500)

## 2017-06-27 NOTE — Telephone Encounter (Signed)
NA     ----- Message from Maple Hudsonichard L Gilbert Jr., MD sent at 06/27/2017  1:25 PM EST ----- Stable.

## 2017-06-27 NOTE — Telephone Encounter (Signed)
-----   Message from Maple Hudsonichard L Gilbert Jr., MD sent at 06/27/2017  1:25 PM EST ----- Stable.

## 2017-06-28 NOTE — Telephone Encounter (Signed)
Pt advised-[name] V Deryl Ports, RMA  

## 2017-07-03 NOTE — Telephone Encounter (Signed)
Visit completed.

## 2017-07-11 ENCOUNTER — Telehealth: Payer: Self-pay

## 2017-07-11 NOTE — Telephone Encounter (Signed)
Pt returned call. Pt's appt has been changed to 20 minutes and changed time to 120 pm. Thanks TNP

## 2017-07-11 NOTE — Telephone Encounter (Signed)
Left message for patient to call back.  Need to change appointment on 12/04/17 due to new template.

## 2017-07-19 NOTE — Progress Notes (Signed)
Jason Tate Date of Birth: 11/06/1945 Medical Record #[id number]  History of Present Illness: [name] is seen for follow up of CAD. He is status post CABG in October 2012. He presented at that time with chest pain and a markedly abnormal stress Echo. He has a history of second degree heart block that resolved with stopping beta blocker therapy. He has a histor of OSA,hypercholesterolemia and gout. Myoview study in March 2018 showed evidence of prior infarct without ischemia. EF 48%.   On follow up today he is doing well. No chest pain or SOB. Isn't exercising as much. He is no longer driving due to macular degeneration. [place] [facility] Planning to walk more in the neighborhood when weather improves.     Current Outpatient Medications on File Prior to Visit  Medication Sig Dispense Refill  . allopurinol (ZYLOPRIM) 100 MG tablet Take 1 tablet (100 mg total) by mouth daily. 90 tablet 3  . aspirin 81 MG tablet Take 81 mg by mouth daily.    . B Complex Vitamins (VITAMIN B COMPLEX PO) Take 1 tablet by mouth daily.     Marland Kitchen. buPROPion (WELLBUTRIN XL) 300 MG 24 hr tablet Take 1 tablet (300 mg total) by mouth daily. 90 tablet 3  . Cetirizine HCl (ZYRTEC ALLERGY PO) Take 1 tablet by mouth daily as needed (allergies).     . Cholecalciferol (VITAMIN D PO) Take 4,000 mg by mouth daily.      . citalopram (CELEXA) 20 MG tablet Take 1 tablet (20 mg total) by mouth daily. 90 tablet 3  . clonazePAM (KLONOPIN) 1 MG tablet Take 1 tablet (1 mg total) by mouth at bedtime as needed (sleep). 30 tablet 4  . fluticasone (FLONASE) 50 MCG/ACT nasal spray Place 1 spray into both nostrils daily as needed for allergies. 16 g 12  . FLUZONE HIGH-DOSE 0.5 ML injection TO BE ADMINISTERED BY PHARMACIST FOR IMMUNIZATION  0  . Misc Natural Products (OSTEO BI-FLEX JOINT SHIELD PO) Take 1 tablet by mouth daily.     . Multiple Vitamins-Minerals (PRESERVISION/LUTEIN PO) Take 1 tablet by mouth daily.      . nabumetone (RELAFEN) 500 MG tablet Take 1 tablet (500 mg total) by mouth 2 (two) times daily. 60 tablet 3  . Omega-3 Fatty Acids (FISH OIL PO) Take 1 tablet by mouth daily.     Marland Kitchen. omeprazole (PRILOSEC) 20 MG capsule Take 1 capsule (20 mg total) by mouth daily. 90 capsule 3  . sildenafil (REVATIO) 20 MG tablet 1 - 5 PO daily as needed 50 tablet 11  . simvastatin (ZOCOR) 20 MG tablet Take 1 tablet (20 mg total) by mouth at bedtime. 90 tablet 3   No current facility-administered medications on file prior to visit.     No Known Allergies  Past Medical History:  Diagnosis Date  . Abnormal stress echo Oct 2012  . CAD (coronary artery disease) October 2012   s/p CABG x 2 per Dr. Cornelius Moraswen  . Hypercholesterolemia   . Low testosterone   . Macular degeneration   . 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 Obesity   . PVC (premature ventricular contraction)   . Sleep apnea, obstructive    on BiPAP    Past Surgical History:  Procedure Laterality Date  . COLONOSCOPY    . CORONARY ARTERY BYPASS GRAFT  Oct 2012   LIMA to LAD and left radial to OM  . HEMORRHOIDECTOMY WITH HEMORRHOID  BANDING      Social History   Tobacco Use  Smoking Status Former Smoker  . Packs/day: 1.00  . Years: 20.00  . Pack years: 20.00  . Types: Cigarettes  . Last attempt to quit: 03/28/1980  . Years since quitting: 37.3  Smokeless Tobacco Never Used    Social History   Substance and Sexual Activity  Alcohol Use Yes  . Alcohol/week: 16.8 oz  . Types: 28 Cans of beer per week    Family History  Problem Relation Age of Onset  . Heart disease Mother   . Stroke Father   . Heart disease Father     Review of Systems: The review of systems is per the HPI.  All other systems were reviewed and are negative.  Physical Exam: BP 132/70   Pulse 77   Ht 6\' 1"  (1.854 m)   Wt 214 lb 3.2 oz (97.2 kg)   BMI 28.26 kg/m  GENERAL:  Well appearing WM in NAD HEENT:  PERRL, EOMI, sclera are  clear. Oropharynx is clear. NECK:  No jugular venous distention, carotid upstroke brisk and symmetric, no bruits, no thyromegaly or adenopathy LUNGS:  Clear to auscultation bilaterally CHEST:  Unremarkable HEART:  RRR,  PMI not displaced or sustained,S1 and S2 within normal limits, no S3, no S4: no clicks, no rubs, no murmurs ABD:  Soft, nontender. BS +, no masses or bruits. No hepatomegaly, no splenomegaly EXT:  2 + pulses throughout, no edema, no cyanosis no clubbing SKIN:  Warm and dry.  No rashes NEURO:  Alert and oriented x 3. Cranial nerves II through XII intact. PSYCH:  Cognitively intact     LABORATORY DATA: Lab Results  Component Value Date   WBC 6.7 06/26/2017   HGB 12.1 (L) 06/26/2017   HCT 36.0 (L) 06/26/2017   PLT 186 06/26/2017   GLUCOSE 101 (H) 06/26/2017   CHOL 161 08/01/2016   TRIG 173 (H) 08/01/2016   HDL 58 08/01/2016   LDLCALC 68 08/01/2016   ALT 30 06/26/2017   AST 21 06/26/2017   NA 142 06/26/2017   K 4.6 06/26/2017   CL 104 06/26/2017   CREATININE 1.25 06/26/2017   BUN 13 06/26/2017   CO2 22 06/26/2017   TSH 2.200 06/26/2017   INR 1.26 03/23/2011   HGBA1C 5.2 03/22/2011   Ecg today shows NSR with rate 77, first degree AV block. PACs.  Septal infarct age undetermined. I have personally reviewed and interpreted this study.  Myoview 08/02/16: Study Highlights     Nuclear stress EF: 48%.  The left ventricular ejection fraction is mildly decreased (45-54%).  Blood pressure demonstrated a blunted response to exercise.  No T wave inversion was noted during stress.  Horizontal ST segment depression ST segment depression of 1 mm was noted during stress in the II, III, aVF, V6, V5 and V4 leads.  Defect 1: There is a medium defect of moderate severity.  Findings consistent with prior myocardial infarction.  This is an intermediate risk study.   Moderate sized, medium intensity fixed anteroseptal, anteroapical, apical and inferoapical perfusion  defect likely scar. No significant reversible ischemia. LVEF 48% with distal anteroapical and apical severe hypokinesis to akinesis. This is an intermediate risk study.    Assessment / Plan: 1. Coronary disease status post CABG in October 2012. Stress Myoview in March 2018 without ischemia. 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  He remains asymptomatic. Continue  current medication.   2. History of Mobitz type 1 second degree AV block.  Resolved with stopping metoprolol. Now first degree AV block.   3. Hyperlipidemia. Will check lipid panel today. Continue Zocor and fish oil.   I will follow up in one year.

## 2017-07-24 ENCOUNTER — Encounter: Payer: Self-pay | Admitting: Cardiology

## 2017-07-24 ENCOUNTER — Ambulatory Visit (INDEPENDENT_AMBULATORY_CARE_PROVIDER_SITE_OTHER): Payer: Medicare Other | Admitting: Cardiology

## 2017-07-24 VITALS — BP 132/70 | HR 77 | Ht 73.0 in | Wt 214.2 lb

## 2017-07-24 DIAGNOSIS — I25810 Atherosclerosis of coronary artery bypass graft(s) without angina pectoris: Secondary | ICD-10-CM

## 2017-07-24 DIAGNOSIS — E7800 Pure hypercholesterolemia, unspecified: Secondary | ICD-10-CM | POA: Diagnosis not present

## 2017-07-24 DIAGNOSIS — Z951 Presence of aortocoronary bypass graft: Secondary | ICD-10-CM | POA: Diagnosis not present

## 2017-07-24 NOTE — Patient Instructions (Addendum)
We will check your lipid levels today  I will see you in one year

## 2017-07-25 LAB — LIPID PANEL W/O CHOL/HDL RATIO
Cholesterol, Total: 139 mg/dL (ref 100–199)
HDL: 58 mg/dL (ref >39)
LDL Calculated: 60 mg/dL (ref 0–99)
Triglycerides: 103 mg/dL (ref 0–149)
VLDL Cholesterol Cal: 21 mg/dL (ref 5–40)

## 2017-08-12 IMAGING — NM NM MISC PROCEDURE
9 series · 54 of 54 positions shown · non-contrast
Comparison: none

[Series 1: rest sax · 6.4mm · 6.40mm/px · 6 of 25 frames shown]
[frame 3/25]
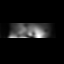
[frame 7/25]
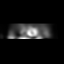
[frame 11/25]
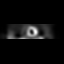
[frame 15/25]
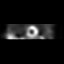
[frame 19/25]
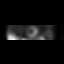
[frame 23/25]
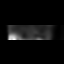

[Series 1: wbr_r-proj_st wbr rest · 6.40mm/px · 6 of 64 frames shown]
[frame 6/64]
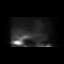
[frame 16/64]
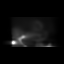
[frame 27/64]
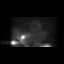
[frame 38/64]
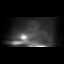
[frame 48/64]
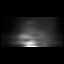
[frame 59/64]
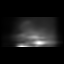

[Series 1: wbr rest · 6.40mm/px · 6 of 64 frames shown]
[frame 6/64]
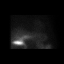
[frame 16/64]
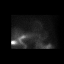
[frame 27/64]
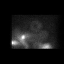
[frame 38/64]
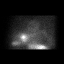
[frame 48/64]
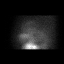
[frame 59/64]
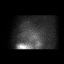

[Series 2: stress sax gs · 6.4mm · 6.40mm/px · 6 of 200 frames shown]
[frame 17/200]
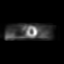
[frame 50/200]
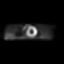
[frame 84/200]
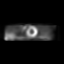
[frame 117/200]
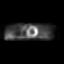
[frame 150/200]
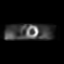
[frame 184/200]
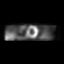

[Series 2: stress sax · 6.4mm · 6.40mm/px · 6 of 25 frames shown]
[frame 3/25]
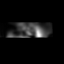
[frame 7/25]
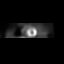
[frame 11/25]
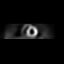
[frame 15/25]
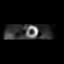
[frame 19/25]
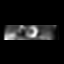
[frame 23/25]
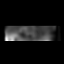

[Series 2: wbr stress-gsp · 6.40mm/px · 6 of 476 frames shown]
[frame 40/476]
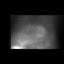
[frame 119/476]
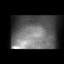
[frame 199/476]
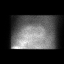
[frame 278/476]
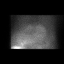
[frame 357/476]
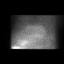
[frame 437/476]
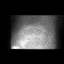

[Series 2: wbr_s-proj_st wbr stress-gsp · 6.40mm/px · 6 of 512 frames shown]
[frame 43/512]
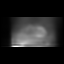
[frame 128/512]
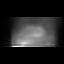
[frame 214/512]
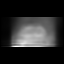
[frame 299/512]
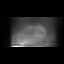
[frame 384/512]
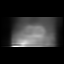
[frame 470/512]
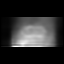

[Series 3: wbr_s-proj_st wbr stress-sum-em · 6.40mm/px · 6 of 64 frames shown]
[frame 6/64]
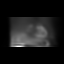
[frame 16/64]
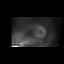
[frame 27/64]
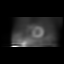
[frame 38/64]
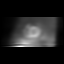
[frame 48/64]
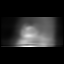
[frame 59/64]
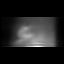

[Series 3: wbr stress-sum-em · 6.40mm/px · 6 of 64 frames shown]
[frame 6/64]
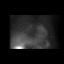
[frame 16/64]
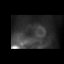
[frame 27/64]
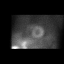
[frame 38/64]
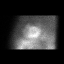
[frame 48/64]
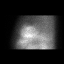
[frame 59/64]
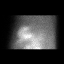

[54 of 54 positions shown; findings below may reference images not displayed]

Canned report from images found in remote index.

Refer to host system for actual result text.

## 2017-09-09 ENCOUNTER — Other Ambulatory Visit: Payer: Self-pay | Admitting: Family Medicine

## 2017-09-09 DIAGNOSIS — J309 Allergic rhinitis, unspecified: Secondary | ICD-10-CM

## 2017-09-09 DIAGNOSIS — F32A Depression, unspecified: Secondary | ICD-10-CM

## 2017-09-09 DIAGNOSIS — K219 Gastro-esophageal reflux disease without esophagitis: Secondary | ICD-10-CM

## 2017-09-09 DIAGNOSIS — M109 Gout, unspecified: Secondary | ICD-10-CM

## 2017-09-09 DIAGNOSIS — F329 Major depressive disorder, single episode, unspecified: Secondary | ICD-10-CM

## 2017-09-09 DIAGNOSIS — I25810 Atherosclerosis of coronary artery bypass graft(s) without angina pectoris: Secondary | ICD-10-CM

## 2017-09-09 DIAGNOSIS — G4709 Other insomnia: Secondary | ICD-10-CM

## 2017-09-09 DIAGNOSIS — M1A0220 Idiopathic chronic gout, left elbow, without tophus (tophi): Secondary | ICD-10-CM

## 2017-09-09 NOTE — Telephone Encounter (Signed)
Pt contacted office for refill request on the following medications:  1. simvastatin (ZOCOR) 20 MG tablet 2. omeprazole (PRILOSEC) 20 MG capsule 3. nabumetone (RELAFEN) 500 MG tablet 4. fluticasone (FLONASE) 50 MCG/ACT nasal spray  5. clonazePAM (KLONOPIN) 1 MG tablet 6. buPROPion (WELLBUTRIN XL) 300 MG 24 hr tablet 7. allopurinol (ZYLOPRIM) 100 MG tablet   90 day supply  Pt is requesting to switch medications to [facility]. Please advise. Thanks TNP  CB# [phone]

## 2017-09-11 MED ORDER — OMEPRAZOLE 20 MG PO CPDR: 20.0000 mg | DELAYED_RELEASE_CAPSULE | Freq: Every day | ORAL | 3 refills | Status: DC

## 2017-09-11 MED ORDER — CLONAZEPAM 1 MG PO TABS: 1.0000 mg | ORAL_TABLET | Freq: Every evening | ORAL | 4 refills | Status: DC | PRN

## 2017-09-11 MED ORDER — FLUTICASONE PROPIONATE 50 MCG/ACT NA SUSP: 1.0000 | Freq: Every day | NASAL | 3 refills | Status: AC | PRN

## 2017-09-11 MED ORDER — ALLOPURINOL 100 MG PO TABS: 100.0000 mg | ORAL_TABLET | Freq: Every day | ORAL | 3 refills | Status: DC

## 2017-09-11 MED ORDER — SIMVASTATIN 20 MG PO TABS: 20.0000 mg | ORAL_TABLET | Freq: Every day | ORAL | 3 refills | Status: DC

## 2017-09-11 MED ORDER — BUPROPION HCL ER (XL) 300 MG PO TB24: 300.0000 mg | ORAL_TABLET | Freq: Every day | ORAL | 3 refills | Status: DC

## 2017-09-11 MED ORDER — NABUMETONE 500 MG PO TABS: 500.0000 mg | ORAL_TABLET | Freq: Two times a day (BID) | ORAL | 3 refills | Status: DC | PRN

## 2017-10-02 DIAGNOSIS — L570 Actinic keratosis: Secondary | ICD-10-CM | POA: Diagnosis not present

## 2017-10-02 DIAGNOSIS — L821 Other seborrheic keratosis: Secondary | ICD-10-CM | POA: Diagnosis not present

## 2017-10-02 DIAGNOSIS — T148XXA Other injury of unspecified body region, initial encounter: Secondary | ICD-10-CM | POA: Diagnosis not present

## 2017-10-02 DIAGNOSIS — L0291 Cutaneous abscess, unspecified: Secondary | ICD-10-CM | POA: Diagnosis not present

## 2017-10-02 DIAGNOSIS — L814 Other melanin hyperpigmentation: Secondary | ICD-10-CM | POA: Diagnosis not present

## 2017-10-17 ENCOUNTER — Encounter: Payer: Self-pay | Admitting: Family Medicine

## 2017-10-17 ENCOUNTER — Ambulatory Visit (INDEPENDENT_AMBULATORY_CARE_PROVIDER_SITE_OTHER): Payer: Medicare Other | Admitting: Family Medicine

## 2017-10-17 VITALS — BP 138/76 | HR 66 | Temp 97.5°F | Wt 218.0 lb

## 2017-10-17 DIAGNOSIS — I25810 Atherosclerosis of coronary artery bypass graft(s) without angina pectoris: Secondary | ICD-10-CM

## 2017-10-17 DIAGNOSIS — S61012A Laceration without foreign body of left thumb without damage to nail, initial encounter: Secondary | ICD-10-CM

## 2017-10-17 DIAGNOSIS — T07XXXA Unspecified multiple injuries, initial encounter: Secondary | ICD-10-CM | POA: Diagnosis not present

## 2017-10-17 NOTE — Progress Notes (Signed)
Patient: Jason Tate Male    DOB: 1945/09/17   72 y.o.   MRN: [id number] Visit Date: 10/17/2017  Today's Provider: Dortha Kern, PA   Chief Complaint  Patient presents with  . Thumb injury   Subjective:    HPI Left thumb laceration:    Incident onset: 1 week ago. The incident occurred after a fall in the yard. Location: left thumb. The patient is experiencing no pain. The laceration has been unchanged since the incident. Patient reports the area is sensitive, but not painful. He states he will be going out the country and wants to make sure the area is okay before leaving.   Past Medical History:  Diagnosis Date  . Abnormal stress echo Oct 2012  . CAD (coronary artery disease) October 2012   s/p CABG x 2 per Dr. Cornelius Moras  . Hypercholesterolemia   . Low testosterone   . Macular degeneration   . 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 Obesity   . PVC (premature ventricular contraction)   . Sleep apnea, obstructive    on BiPAP   Past Surgical History:  Procedure Laterality Date  . COLONOSCOPY    . CORONARY ARTERY BYPASS GRAFT  Oct 2012   LIMA to LAD and left radial to OM  . HEMORRHOIDECTOMY WITH HEMORRHOID BANDING     Family History  Problem Relation Age of Onset  . Heart disease Mother   . Stroke Father   . Heart disease Father    No Known Allergies  Current Outpatient Medications:  .  allopurinol (ZYLOPRIM) 100 MG tablet, Take 1 tablet (100 mg total) by mouth daily., Disp: 90 tablet, Rfl: 3 .  aspirin 81 MG tablet, Take 81 mg by mouth daily., Disp: , Rfl:  .  B Complex Vitamins (VITAMIN B COMPLEX PO), Take 1 tablet by mouth daily. , Disp: , Rfl:  .  buPROPion (WELLBUTRIN XL) 300 MG 24 hr tablet, Take 1 tablet (300 mg total) by mouth daily., Disp: 90 tablet, Rfl: 3 .  Cetirizine HCl (ZYRTEC ALLERGY PO), Take 1 tablet by mouth daily as needed (allergies). , Disp: , Rfl:  .  Cholecalciferol (VITAMIN D PO), Take 4,000 mg by mouth daily.  ,  Disp: , Rfl:  .  citalopram (CELEXA) 20 MG tablet, Take 1 tablet (20 mg total) by mouth daily., Disp: 90 tablet, Rfl: 3 .  clonazePAM (KLONOPIN) 1 MG tablet, Take 1 tablet (1 mg total) by mouth at bedtime as needed (sleep)., Disp: 30 tablet, Rfl: 4 .  fluticasone (FLONASE) 50 MCG/ACT nasal spray, Place 1 spray into both nostrils daily as needed for allergies., Disp: 16 g, Rfl: 3 .  FLUZONE HIGH-DOSE 0.5 ML injection, TO BE ADMINISTERED BY PHARMACIST FOR IMMUNIZATION, Disp: , Rfl: 0 .  Misc Natural Products (OSTEO BI-FLEX JOINT SHIELD PO), Take 1 tablet by mouth daily. , Disp: , Rfl:  .  Multiple Vitamins-Minerals (PRESERVISION/LUTEIN PO), Take 1 tablet by mouth daily. , Disp: , Rfl:  .  nabumetone (RELAFEN) 500 MG tablet, Take 1 tablet (500 mg total) by mouth 2 (two) times daily as needed., Disp: 60 tablet, Rfl: 3 .  Omega-3 Fatty Acids (FISH OIL PO), Take 1 tablet by mouth daily. , Disp: , Rfl:  .  omeprazole (PRILOSEC) 20 MG capsule, Take 1 capsule (20 mg total) by mouth daily., Disp: 90 capsule, Rfl: 3 .  sildenafil (REVATIO) 20 MG tablet, 1 - 5 PO daily as needed, Disp: 50 tablet, Rfl:  11 .  simvastatin (ZOCOR) 20 MG tablet, Take 1 tablet (20 mg total) by mouth at bedtime., Disp: 90 tablet, Rfl: 3  Review of Systems  Constitutional: Negative.   Respiratory: Negative.   Cardiovascular: Negative.   Skin:       Left thumb laceration    Social History   Tobacco Use  . Smoking status: Former Smoker    Packs/day: 1.00    Years: 20.00    Pack years: 20.00    Types: Cigarettes    Last attempt to quit: 03/28/1980    Years since quitting: 37.5  . Smokeless tobacco: Never Used  Substance Use Topics  . Alcohol use: Yes    Alcohol/week: 16.8 oz    Types: 28 Cans of beer per week   Objective:   BP 138/76 (BP Location: Right Arm, Patient Position: Sitting, Cuff Size: Normal)   Pulse 66   Temp (!) 97.5 F (36.4 C) (Oral)   Wt 218 lb (98.9 kg)   SpO2 99%   BMI 28.76 kg/m   Physical  Exam  Constitutional: He is oriented to person, place, and time. He appears well-developed and well-nourished. No distress.  HENT:  Head: Normocephalic and atraumatic.  Right Ear: Hearing normal.  Left Ear: Hearing normal.  Nose: Nose normal.  Eyes: Conjunctivae and lids are normal. Right eye exhibits no discharge. Left eye exhibits no discharge. No scleral icterus.  Pulmonary/Chest: Effort normal. No respiratory distress.  Musculoskeletal: Normal range of motion.  Neurological: He is alert and oriented to person, place, and time.  Skin: Skin is intact. No lesion and no rash noted.  Healed abrasions right knee, scabbed healing abrasion left knee (4 x 4 cm), healed abrasions right elbow with a triangular avulsed area on the left thumb pad (1.5 x 0.5 cm). No numbness of the tip of the thumb.  Psychiatric: He has a normal mood and affect. His speech is normal and behavior is normal. Thought content normal.      Assessment & Plan:     1. Laceration of left thumb without foreign body without damage to nail, initial encounter Larey Seat when he tripped on uneven asphalt helping his neighbor with a trash can a week ago. Has an avulsion-type laceration on the left thumb. No active bleeding with good scab formation, no sign of infection and no neurovascular deficit. Last tetanus was given on 09-01-09. May use Band-Aid dressing for protection. Recheck prn.  2. Multiple abrasions Healing abrasions both knees and right elbow. Only left knee has any scab remaining. No sign of infection.       Dortha Kern, PA  [facility]

## 2017-11-13 ENCOUNTER — Encounter: Payer: Self-pay | Admitting: Family Medicine

## 2017-11-13 ENCOUNTER — Ambulatory Visit (INDEPENDENT_AMBULATORY_CARE_PROVIDER_SITE_OTHER): Payer: Medicare Other | Admitting: Family Medicine

## 2017-11-13 VITALS — BP 112/62 | HR 76 | Temp 97.7°F | Resp 16 | Ht 73.0 in | Wt 220.0 lb

## 2017-11-13 DIAGNOSIS — I25810 Atherosclerosis of coronary artery bypass graft(s) without angina pectoris: Secondary | ICD-10-CM | POA: Diagnosis not present

## 2017-11-13 DIAGNOSIS — R42 Dizziness and giddiness: Secondary | ICD-10-CM

## 2017-11-13 NOTE — Progress Notes (Signed)
Patient: Jason Tate Male    DOB: [birth year]   72 y.o.   MRN: [id number] Visit Date: 11/13/2017  Today's Provider: Megan Mans, MD   No chief complaint on file.  Subjective:    Dizziness  This is a new problem. The current episode started 1 to 4 weeks ago (more than 2 weeks). The problem occurs intermittently. The problem has been unchanged. Associated symptoms include vertigo. Pertinent negatives include no congestion, coughing, fever, myalgias, neck pain or visual change. The symptoms are aggravated by bending. He has tried nothing for the symptoms.  It occurs only with standing. Not with any other movements.     No Known Allergies   Current Outpatient Medications:  .  allopurinol (ZYLOPRIM) 100 MG tablet, Take 1 tablet (100 mg total) by mouth daily., Disp: 90 tablet, Rfl: 3 .  aspirin 81 MG tablet, Take 81 mg by mouth daily., Disp: , Rfl:  .  B Complex Vitamins (VITAMIN B COMPLEX PO), Take 1 tablet by mouth daily. , Disp: , Rfl:  .  buPROPion (WELLBUTRIN XL) 300 MG 24 hr tablet, Take 1 tablet (300 mg total) by mouth daily., Disp: 90 tablet, Rfl: 3 .  Cetirizine HCl (ZYRTEC ALLERGY PO), Take 1 tablet by mouth daily as needed (allergies). , Disp: , Rfl:  .  Cholecalciferol (VITAMIN D PO), Take 4,000 mg by mouth daily.  , Disp: , Rfl:  .  citalopram (CELEXA) 20 MG tablet, Take 1 tablet (20 mg total) by mouth daily., Disp: 90 tablet, Rfl: 3 .  clonazePAM (KLONOPIN) 1 MG tablet, Take 1 tablet (1 mg total) by mouth at bedtime as needed (sleep)., Disp: 30 tablet, Rfl: 4 .  fluticasone (FLONASE) 50 MCG/ACT nasal spray, Place 1 spray into both nostrils daily as needed for allergies., Disp: 16 g, Rfl: 3 .  Misc Natural Products (OSTEO BI-FLEX JOINT SHIELD PO), Take 1 tablet by mouth daily. , Disp: , Rfl:  .  Multiple Vitamins-Minerals (PRESERVISION/LUTEIN PO), Take 1 tablet by mouth daily. , Disp: , Rfl:  .  nabumetone (RELAFEN) 500 MG tablet, Take 1 tablet (500 mg total)  by mouth 2 (two) times daily as needed., Disp: 60 tablet, Rfl: 3 .  Omega-3 Fatty Acids (FISH OIL PO), Take 1 tablet by mouth daily. , Disp: , Rfl:  .  omeprazole (PRILOSEC) 20 MG capsule, Take 1 capsule (20 mg total) by mouth daily., Disp: 90 capsule, Rfl: 3 .  sildenafil (REVATIO) 20 MG tablet, 1 - 5 PO daily as needed, Disp: 50 tablet, Rfl: 11 .  simvastatin (ZOCOR) 20 MG tablet, Take 1 tablet (20 mg total) by mouth at bedtime., Disp: 90 tablet, Rfl: 3 .  FLUZONE HIGH-DOSE 0.5 ML injection, TO BE ADMINISTERED BY PHARMACIST FOR IMMUNIZATION, Disp: , Rfl: 0  Review of Systems  Constitutional: Negative.  Negative for fever.  HENT: Negative for congestion.   Eyes: Negative.   Respiratory: Negative.  Negative for cough.   Cardiovascular: Negative.   Gastrointestinal: Negative.   Endocrine: Negative.   Musculoskeletal: Negative for myalgias and neck pain.  Allergic/Immunologic: Negative.   Neurological: Positive for dizziness and vertigo.  Hematological: Negative.   Psychiatric/Behavioral: Negative.     Social History   Tobacco Use  . Smoking status: Former Smoker    Packs/day: 1.00    Years: 20.00    Pack years: 20.00    Types: Cigarettes    Last attempt to quit: 03/28/1980    Years since quitting:  37.6  . Smokeless tobacco: Never Used  Substance Use Topics  . Alcohol use: Yes    Alcohol/week: 16.8 oz    Types: 28 Cans of beer per week   Objective:   BP 112/62 (BP Location: Right Arm, Patient Position: Sitting, Cuff Size: Normal)   Pulse 76   Temp 97.7 F (36.5 C)   Resp 16   Ht 6\' 1"  (1.854 m)   Wt 220 lb (99.8 kg)   SpO2 97%   BMI 29.03 kg/m    Physical Exam  Constitutional: He is oriented to person, place, and time. He appears well-developed and well-nourished.  HENT:  Head: Normocephalic and atraumatic.  Right Ear: External ear normal.  Left Ear: External ear normal.  Nose: Nose normal.  Mouth/Throat: Oropharynx is clear and moist.  Eyes: Pupils are equal,  round, and reactive to light. Conjunctivae and EOM are normal. No scleral icterus.  Neck: No thyromegaly present.  Cardiovascular: Normal rate, regular rhythm and normal heart sounds.  Pulmonary/Chest: Effort normal and breath sounds normal.  Abdominal: Soft.  Musculoskeletal: He exhibits no edema.  Neurological: He is alert and oriented to person, place, and time.  Skin: Skin is warm and dry.  Psychiatric: He has a normal mood and affect. His behavior is normal. Judgment and thought content normal.        Assessment & Plan:     Dizziness/possible orthostasis Fluids/support hose. F/u prn. CAD HLD  I have done the exam and reviewed the chart and it is accurate to the best of my knowledge. DentistDragon  technology has been used and  any errors in dictation or transcription are unintentional. Julieanne Mansonichard Gilbert M.D. [facility]       Megan Mansichard Gilbert Jr, MD  [facility]

## 2017-12-04 ENCOUNTER — Encounter: Payer: Self-pay | Admitting: Family Medicine

## 2017-12-04 ENCOUNTER — Ambulatory Visit (INDEPENDENT_AMBULATORY_CARE_PROVIDER_SITE_OTHER): Payer: Medicare Other | Admitting: Family Medicine

## 2017-12-04 VITALS — BP 132/78 | HR 90 | Temp 98.5°F | Resp 16 | Ht 73.0 in | Wt 221.0 lb

## 2017-12-04 DIAGNOSIS — G4733 Obstructive sleep apnea (adult) (pediatric): Secondary | ICD-10-CM | POA: Diagnosis not present

## 2017-12-04 DIAGNOSIS — G4709 Other insomnia: Secondary | ICD-10-CM | POA: Diagnosis not present

## 2017-12-04 DIAGNOSIS — F329 Major depressive disorder, single episode, unspecified: Secondary | ICD-10-CM | POA: Diagnosis not present

## 2017-12-04 DIAGNOSIS — I25810 Atherosclerosis of coronary artery bypass graft(s) without angina pectoris: Secondary | ICD-10-CM

## 2017-12-04 DIAGNOSIS — F32A Depression, unspecified: Secondary | ICD-10-CM

## 2017-12-04 DIAGNOSIS — Z951 Presence of aortocoronary bypass graft: Secondary | ICD-10-CM | POA: Diagnosis not present

## 2017-12-04 MED ORDER — CLONAZEPAM 1 MG PO TABS: 1.0000 mg | ORAL_TABLET | Freq: Every evening | ORAL | 1 refills | Status: DC | PRN

## 2017-12-04 NOTE — Progress Notes (Signed)
Patient: Jason Tate Male    DOB: 03/16/1946   72 y.o.   MRN: [id number] Visit Date: 12/04/2017  Today's Provider: Megan Mansichard Ceil Roderick Jr, MD   Chief Complaint  Patient presents with  . Depression  . Gastroesophageal Reflux  . Insomnia  . Hyperlipidemia   Subjective:    HPI Patient comes in today for a 6 month follow up on chronic issues.   Depression- No changes were made since last visit. Patient is currently taking Wellbutrin 300mg  once daily. He reports good symptom control. [facility] screen [facility] 2[facility] 06/06/2017 06/05/2016 04/18/2015 04/18/2015  Decreased Interest 1 0 1 0  Down, Depressed, Hopeless 3 1 1 1   PHQ - 2 Score 4 1 2 1   Altered sleeping 0 - 3 -  Tired, decreased energy 2 - 1 -  Change in appetite 0 - 0 -  Feeling bad or failure about yourself  0 - 0 -  Trouble concentrating 0 - 0 -  Moving slowly or fidgety/restless 0 - 0 -  Suicidal thoughts 0 - 0 -  PHQ-9 Score 6 - 6 -  Difficult doing work/chores Not difficult at all - Somewhat difficult -    GERD- No changes were made since last visit. Patient is currently taking omeprazole 20mg  daily. He reports good compliance and good symptom control.   Insomnia- Patient is currently taking Clonazepam 1mg  at bedtime. He reports that this does help with his sleep. He is also requesting a refill for a 90 day supply.    Hyperlipidemia- No changes were made since last visit. Patient is currently taking simvastatin 20mg  daily. He is tolerating the medication well with no side effects.  Lab Results  Component Value Date   CHOL 139 07/24/2017   HDL 58 07/24/2017   LDLCALC 60 07/24/2017   TRIG 103 07/24/2017   CHOLHDL 2.8 08/01/2016       No Known Allergies   Current Outpatient Medications:  .  allopurinol (ZYLOPRIM) 100 MG tablet, Take 1 tablet (100 mg total) by mouth daily., Disp: 90 tablet, Rfl: 3 .  aspirin 81 MG tablet, Take 81 mg by mouth daily., Disp: , Rfl:  .  B Complex Vitamins (VITAMIN B COMPLEX  PO), Take 1 tablet by mouth daily. , Disp: , Rfl:  .  buPROPion (WELLBUTRIN XL) 300 MG 24 hr tablet, Take 1 tablet (300 mg total) by mouth daily., Disp: 90 tablet, Rfl: 3 .  Cetirizine HCl (ZYRTEC ALLERGY PO), Take 1 tablet by mouth daily as needed (allergies). , Disp: , Rfl:  .  Cholecalciferol (VITAMIN D PO), Take 4,000 mg by mouth daily.  , Disp: , Rfl:  .  citalopram (CELEXA) 20 MG tablet, Take 1 tablet (20 mg total) by mouth daily., Disp: 90 tablet, Rfl: 3 .  clonazePAM (KLONOPIN) 1 MG tablet, Take 1 tablet (1 mg total) by mouth at bedtime as needed (sleep)., Disp: 30 tablet, Rfl: 4 .  fluticasone (FLONASE) 50 MCG/ACT nasal spray, Place 1 spray into both nostrils daily as needed for allergies., Disp: 16 g, Rfl: 3 .  FLUZONE HIGH-DOSE 0.5 ML injection, TO BE ADMINISTERED BY PHARMACIST FOR IMMUNIZATION, Disp: , Rfl: 0 .  Misc Natural Products (OSTEO BI-FLEX JOINT SHIELD PO), Take 1 tablet by mouth daily. , Disp: , Rfl:  .  Multiple Vitamins-Minerals (PRESERVISION/LUTEIN PO), Take 1 tablet by mouth daily. , Disp: , Rfl:  .  nabumetone (RELAFEN) 500 MG tablet, Take 1 tablet (500 mg total) by mouth  2 (two) times daily as needed., Disp: 60 tablet, Rfl: 3 .  Omega-3 Fatty Acids (FISH OIL PO), Take 1 tablet by mouth daily. , Disp: , Rfl:  .  omeprazole (PRILOSEC) 20 MG capsule, Take 1 capsule (20 mg total) by mouth daily., Disp: 90 capsule, Rfl: 3 .  sildenafil (REVATIO) 20 MG tablet, 1 - 5 PO daily as needed, Disp: 50 tablet, Rfl: 11 .  simvastatin (ZOCOR) 20 MG tablet, Take 1 tablet (20 mg total) by mouth at bedtime., Disp: 90 tablet, Rfl: 3  Review of Systems  Constitutional: Negative for activity change, appetite change, chills, diaphoresis, fatigue, fever and unexpected weight change.  HENT: Negative.   Eyes: Negative.   Respiratory: Negative for cough and shortness of breath.   Cardiovascular: Negative for chest pain, palpitations and leg swelling.  Gastrointestinal: Negative.   Endocrine:  Negative.   Musculoskeletal: Negative for arthralgias, back pain, joint swelling, myalgias, neck pain and neck stiffness.  Skin: Negative.   Allergic/Immunologic: Negative.  Negative for environmental allergies.  Neurological: Negative for dizziness.  Hematological: Negative.   Psychiatric/Behavioral: Negative.     Social History   Tobacco Use  . Smoking status: Former Smoker    Packs/day: 1.00    Years: 20.00    Pack years: 20.00    Types: Cigarettes    Last attempt to quit: 03/28/1980    Years since quitting: 37.7  . Smokeless tobacco: Never Used  Substance Use Topics  . Alcohol use: Yes    Alcohol/week: 16.8 oz    Types: 28 Cans of beer per week   Objective:   BP 132/78 (BP Location: Right Arm, Patient Position: Sitting, Cuff Size: Normal)   Pulse 90   Temp 98.5 F (36.9 C)   Resp 16   Ht 6\' 1"  (1.854 m)   Wt 221 lb (100.2 kg)   SpO2 99%   BMI 29.16 kg/m  Vitals:   12/04/17 1335  BP: 132/78  Pulse: 90  Resp: 16  Temp: 98.5 F (36.9 C)  SpO2: 99%  Weight: 221 lb (100.2 kg)  Height: 6\' 1"  (1.854 m)     Physical Exam  Constitutional: He is oriented to person, place, and time. He appears well-developed and well-nourished.  HENT:  Head: Normocephalic and atraumatic.  Right Ear: External ear normal.  Left Ear: External ear normal.  Nose: Nose normal.  Eyes: Conjunctivae are normal. No scleral icterus.  Neck: No thyromegaly present.  Cardiovascular: Normal rate, regular rhythm and normal heart sounds.  Pulmonary/Chest: Effort normal and breath sounds normal.  Abdominal: Soft.  Musculoskeletal: He exhibits no edema.  Neurological: He is alert and oriented to person, place, and time.  Skin: Skin is warm and dry.  Psychiatric: He has a normal mood and affect. His behavior is normal. Judgment and thought content normal.        Assessment & Plan:     1. Other insomnia  - clonazePAM (KLONOPIN) 1 MG tablet; Take 1 tablet (1 mg total) by mouth at bedtime as  needed (sleep).  Dispense: 90 tablet; Refill: 1 2.GERD 3.MDD 4.CAD All stable.     I have done the exam and reviewed the above chart and it is accurate to the best of my knowledge. Dentist has been used in this note in any air is in the dictation or transcription are unintentional.  Megan Mans, MD  [facility]

## 2018-02-12 DIAGNOSIS — M25562 Pain in left knee: Secondary | ICD-10-CM | POA: Diagnosis not present

## 2018-02-12 DIAGNOSIS — M25561 Pain in right knee: Secondary | ICD-10-CM | POA: Diagnosis not present

## 2018-02-12 DIAGNOSIS — M1711 Unilateral primary osteoarthritis, right knee: Secondary | ICD-10-CM | POA: Diagnosis not present

## 2018-03-13 DIAGNOSIS — Z23 Encounter for immunization: Secondary | ICD-10-CM | POA: Diagnosis not present

## 2018-04-10 DIAGNOSIS — M25561 Pain in right knee: Secondary | ICD-10-CM | POA: Diagnosis not present

## 2018-04-10 DIAGNOSIS — M25562 Pain in left knee: Secondary | ICD-10-CM | POA: Diagnosis not present

## 2018-04-10 DIAGNOSIS — M1711 Unilateral primary osteoarthritis, right knee: Secondary | ICD-10-CM | POA: Diagnosis not present

## 2018-05-01 DIAGNOSIS — M1711 Unilateral primary osteoarthritis, right knee: Secondary | ICD-10-CM | POA: Diagnosis not present

## 2018-05-07 DIAGNOSIS — M1711 Unilateral primary osteoarthritis, right knee: Secondary | ICD-10-CM | POA: Diagnosis not present

## 2018-05-15 DIAGNOSIS — M1711 Unilateral primary osteoarthritis, right knee: Secondary | ICD-10-CM | POA: Diagnosis not present

## 2018-05-15 DIAGNOSIS — M1712 Unilateral primary osteoarthritis, left knee: Secondary | ICD-10-CM | POA: Diagnosis not present

## 2018-05-15 DIAGNOSIS — M25562 Pain in left knee: Secondary | ICD-10-CM | POA: Diagnosis not present

## 2018-05-27 ENCOUNTER — Other Ambulatory Visit: Payer: Self-pay | Admitting: Family Medicine

## 2018-05-27 DIAGNOSIS — G4709 Other insomnia: Secondary | ICD-10-CM

## 2018-05-29 DIAGNOSIS — S00202A Unspecified superficial injury of left eyelid and periocular area, initial encounter: Secondary | ICD-10-CM | POA: Diagnosis not present

## 2018-06-10 ENCOUNTER — Encounter: Payer: Self-pay | Admitting: Family Medicine

## 2018-06-10 ENCOUNTER — Ambulatory Visit (INDEPENDENT_AMBULATORY_CARE_PROVIDER_SITE_OTHER): Payer: Medicare Other | Admitting: Family Medicine

## 2018-06-10 ENCOUNTER — Ambulatory Visit (INDEPENDENT_AMBULATORY_CARE_PROVIDER_SITE_OTHER): Payer: Medicare Other

## 2018-06-10 VITALS — BP 122/84 | HR 83 | Temp 98.8°F | Ht 73.0 in | Wt 211.0 lb

## 2018-06-10 VITALS — BP 122/84 | HR 83 | Temp 98.8°F | Wt 211.0 lb

## 2018-06-10 DIAGNOSIS — I25810 Atherosclerosis of coronary artery bypass graft(s) without angina pectoris: Secondary | ICD-10-CM | POA: Diagnosis not present

## 2018-06-10 DIAGNOSIS — R5383 Other fatigue: Secondary | ICD-10-CM | POA: Diagnosis not present

## 2018-06-10 DIAGNOSIS — W19XXXA Unspecified fall, initial encounter: Secondary | ICD-10-CM

## 2018-06-10 DIAGNOSIS — T07XXXA Unspecified multiple injuries, initial encounter: Secondary | ICD-10-CM | POA: Diagnosis not present

## 2018-06-10 DIAGNOSIS — E7800 Pure hypercholesterolemia, unspecified: Secondary | ICD-10-CM

## 2018-06-10 DIAGNOSIS — Z0000 Encounter for general adult medical examination without abnormal findings: Secondary | ICD-10-CM

## 2018-06-10 MED ORDER — HYDROCODONE-ACETAMINOPHEN 10-325 MG PO TABS: 1.0000 | ORAL_TABLET | Freq: Three times a day (TID) | ORAL | 0 refills | Status: DC | PRN

## 2018-06-10 NOTE — Progress Notes (Signed)
Patient: Jason Tate Parcell, Male    DOB: 01/13/1946, 73 y.o.   MRN: [id number] Visit Date: 06/10/2018  Today's Provider: Megan Mansichard Gilbert Jr, MD   Chief Complaint  Patient presents with  . Annual Exam   Subjective:    Annual wellness visit Jason Tate Platte is a 73 y.o. male. He feels fairly well. He reports exercising none. He reports he is sleeping poorly.  -----------------------------------------------------------   Falls Patient states he has had 2 falls last week. He has not be able to seen due to the pain from the falls. He fell about a week ago and fell on his face.  [place] and [place] backwards down about 6 steps in the stadium.  And soreness in all areas that he is bruise he has no complaints.  He has some mild facial pain but mainly shoulder and back pain.  It is diffuse.  Appears to be muscular.   Review of Systems  Constitutional: Negative.   HENT: Negative.   Eyes: Negative.   Respiratory: Negative.   Cardiovascular: Negative.   Gastrointestinal: Negative.   Endocrine: Negative.   Genitourinary: Negative.   Musculoskeletal: Negative.   Skin: Negative.   Allergic/Immunologic: Negative.   Neurological: Negative.   Hematological: Bruises/bleeds easily.  Psychiatric/Behavioral: Negative.     Social History   Socioeconomic History  . Marital status: Married    Spouse name: Not on file  . Number of children: 2  . Years of education: Not on file  . Highest education level: Bachelor's degree (e.g., BA, AB, BS)  Occupational History  . Occupation: Designer, television/film setpharmaceutical rep    Comment: retired  Engineer, productionocial Needs  . Financial resource strain: Not hard at all  . Food insecurity:    Worry: Never true    Inability: Never true  . Transportation needs:    Medical: No    Non-medical: No  Tobacco Use  . Smoking status: Former Smoker    Packs/day: 1.00    Years: 20.00    Pack years: 20.00    Types: Cigarettes    Last attempt to  quit: 03/28/1980    Years since quitting: 38.2  . Smokeless tobacco: Never Used  Substance and Sexual Activity  . Alcohol use: Yes    Alcohol/week: 28.0 standard drinks    Types: 28 Cans of beer per week    Comment: 4 beers a night  . Drug use: No  . Sexual activity: Yes  Lifestyle  . Physical activity:    Days per week: 0 days    Minutes per session: 0 min  . Stress: Not at all  Relationships  . Social connections:    Talks on phone: Patient refused    Gets together: Patient refused    Attends religious service: Patient refused    Active member of club or organization: Patient refused    Attends meetings of clubs or organizations: Patient refused    Relationship status: Patient refused  . Intimate partner violence:    Fear of current or ex partner: No    Emotionally abused: No    Physically abused: No    Forced sexual activity: No  Other Topics Concern  . Not on file  Social History Narrative   I son deceased- sucicide    Past Medical History:  Diagnosis Date  . Abnormal stress echo Oct 2012  . Arthritis   . CAD (coronary artery disease) October 2012   s/p CABG  x 2 per Dr. Cornelius Moraswen  . Hypercholesterolemia   . Low testosterone   . Macular degeneration   . 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 Obesity   . PVC (premature ventricular contraction)   . Sleep apnea, obstructive    on BiPAP     Patient Active Problem List   Diagnosis Date Noted  . Depression 04/18/2015  . OSA (obstructive sleep apnea) 04/18/2015  . Leg edema, left 11/02/2013  . 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 CAD (coronary artery disease) 04/09/2011  . Gout 04/09/2011  . Fatigue 03/05/2011  . Abnormal ECG 03/05/2011  . Hypercholesterolemia   . PVC (premature ventricular contraction)     Past Surgical History:  Procedure Laterality Date  . COLONOSCOPY    . CORONARY ARTERY BYPASS GRAFT  Oct 2012   LIMA to LAD  and left radial to OM  . HEMORRHOIDECTOMY WITH HEMORRHOID BANDING    . INJECTION KNEE      His family history includes Heart disease in his father and mother; Stroke in his father.      Current Outpatient Medications:  .  allopurinol (ZYLOPRIM) 100 MG tablet, Take 1 tablet (100 mg total) by mouth daily., Disp: 90 tablet, Rfl: 3 .  aspirin 81 MG tablet, Take 81 mg by mouth daily., Disp: , Rfl:  .  B Complex Vitamins (VITAMIN B COMPLEX PO), Take 1 tablet by mouth daily. , Disp: , Rfl:  .  buPROPion (WELLBUTRIN XL) 300 MG 24 hr tablet, Take 1 tablet (300 mg total) by mouth daily., Disp: 90 tablet, Rfl: 3 .  Cetirizine HCl (ZYRTEC ALLERGY PO), Take 1 tablet by mouth daily as needed (allergies). , Disp: , Rfl:  .  Cholecalciferol (VITAMIN D PO), Take 4,000 mg by mouth daily.  , Disp: , Rfl:  .  citalopram (CELEXA) 20 MG tablet, Take 1 tablet (20 mg total) by mouth daily., Disp: 90 tablet, Rfl: 3 .  clonazePAM (KLONOPIN) 1 MG tablet, TAKE 1 TABLET BY MOUTH AT BEDTIME AS NEEDED FOR SLEEP, Disp: 90 tablet, Rfl: 1 .  fluticasone (FLONASE) 50 MCG/ACT nasal spray, Place 1 spray into both nostrils daily as needed for allergies., Disp: 16 g, Rfl: 3 .  FLUZONE HIGH-DOSE 0.5 ML injection, TO BE ADMINISTERED BY PHARMACIST FOR IMMUNIZATION, Disp: , Rfl: 0 .  Misc Natural Products (OSTEO BI-FLEX JOINT SHIELD PO), Take 1 tablet by mouth daily. , Disp: , Rfl:  .  Multiple Vitamins-Minerals (PRESERVISION/LUTEIN PO), Take 1 tablet by mouth daily. , Disp: , Rfl:  .  nabumetone (RELAFEN) 500 MG tablet, Take 1 tablet (500 mg total) by mouth 2 (two) times daily as needed., Disp: 60 tablet, Rfl: 3 .  Omega-3 Fatty Acids (FISH OIL PO), Take 1 tablet by mouth daily. , Disp: , Rfl:  .  omeprazole (PRILOSEC) 20 MG capsule, Take 1 capsule (20 mg total) by mouth daily., Disp: 90 capsule, Rfl: 3 .  sildenafil (REVATIO) 20 MG tablet, 1 - 5 PO daily as needed, Disp: 50 tablet, Rfl: 11 .  simvastatin (ZOCOR) 20 MG tablet, Take  1 tablet (20 mg total) by mouth at bedtime., Disp: 90 tablet, Rfl: 3  Patient Care Team: Maple HudsonGilbert, Richard Tate Jr., MD as PCP - General (Unknown Physician Specialty) [place]Jordan, Peter M, MD (Cardiology) Maris BergerMcCuen, Christine, MD as Consulting Physician (Ophthalmology) Luciana Axeankin, Alford HighlandGary A, MD as Consulting Physician (Ophthalmology) Sharrell KuMedoff, Jeffrey, MD as Consulting Physician (Gastroenterology) Charlann Boxerlin,  Molli Hazard, MD as Consulting Physician (Orthopedic Surgery)     Objective:   Vitals: BP 122/84 (BP Location: Right Arm, Patient Position: Sitting, Cuff Size: Normal)   Pulse 83   Temp 98.8 F (37.1 C) (Oral)   Wt 211 lb (95.7 kg)   SpO2 97%   BMI 27.84 kg/m   Physical Exam Constitutional:      Appearance: Normal appearance. He is well-developed.  HENT:     Head: Normocephalic and atraumatic.     Comments: Ecchymosis around both eyes and on the anterior face from the fall.  He has no tenderness of the face.    Right Ear: External ear normal.     Left Ear: External ear normal.     Nose: Nose normal.  Eyes:     General: No scleral icterus.    Conjunctiva/sclera: Conjunctivae normal.  Neck:     Thyroid: No thyromegaly.  Cardiovascular:     Rate and Rhythm: Normal rate and regular rhythm.     Heart sounds: Normal heart sounds.     Comments: Checking for orthostasis.  Supine Blood pressure 141/82 with a heart rate of 89, sitting blood pressure 140/80 with a heart rate of 89 standing blood pressure 138/74 with a heart rate of 91 Pulmonary:     Effort: Pulmonary effort is normal.     Breath sounds: Normal breath sounds.  Abdominal:     Palpations: Abdomen is soft.  Musculoskeletal:        General: No deformity.     Comments: Has bruising over the right shoulder.  Orthopedic exam is otherwise benign.  Skin:    General: Skin is warm and dry.  Neurological:     General: No focal deficit present.     Mental Status: He is alert and oriented to person, place, and time. Mental status is at baseline.      Comments: He has a nonfocal exam.  He does not have a lot of arm movement with his gait.  Psychiatric:        Behavior: Behavior normal.        Thought Content: Thought content normal.        Judgment: Judgment normal.     Activities of Daily Living In your present state of health, do you have any difficulty performing the following activities: 06/10/2018  Hearing? N  Vision? Y  Comment Due to Macular Degeneration.   Difficulty concentrating or making decisions? N  Walking or climbing stairs? Y  Comment Due to right knee pain and strength.   Dressing or bathing? N  Doing errands, shopping? Y  Comment Does not drive.   Preparing Food and eating ? N  Using the Toilet? N  In the past six months, have you accidently leaked urine? Y  Comment Rarely, does not wear protection.   Do you have problems with loss of bowel control? N  Managing your Medications? N  Managing your Finances? N  Housekeeping or managing your Housekeeping? N  Some recent data might be hidden    Fall Risk Assessment Fall Risk  06/10/2018 06/06/2017 06/05/2016 04/18/2015  Falls in the past year? 1 Yes No No  Comment - tripped - -  Number falls in past yr: 1 1 - -  Injury with Fall? 0 Yes - -  Comment - tore cartiledge in right knee - -  Follow up Falls prevention discussed Falls prevention discussed - -     Depression Screen [facility] 2/9 Scores 06/10/2018 06/06/2017 06/05/2016 04/18/2015  PHQ - 2 Score 0 4 1 2   PHQ- 9 Score - 6 - 6    6CIT Screen 06/05/2016  What Year? 0 points  What month? 0 points  What time? 0 points  Count back from 20 0 points  Months in reverse 0 points  Repeat phrase 0 points  Total Score 0     Assessment & Plan:     Annual Wellness Visit  Reviewed patient's Family Medical History Reviewed and updated list of patient's medical providers Assessment of cognitive impairment was done Assessed patient's functional ability Established a written schedule for health screening  services Health Risk Assessent Completed and Reviewed  Exercise Activities and Dietary recommendations Goals    . DIET - INCREASE WATER INTAKE     Recommend to drink at least 6-8 8oz glasses of water per day.    . Exercise      Starting 06/05/17, I will start exercising 3 days a week for 30 minutes.    . Exercise 3x per week (30 min per time)     Pt plans to start back walking 3 days a week for at least 30 minutes.       Immunization History  Administered Date(s) Administered  . Influenza, High Dose Seasonal PF 03/04/2017  . Influenza, Seasonal, Injecte, Preservative Fre 03/23/2015  . Influenza,inj,quad, With Preservative 03/13/2018  . Influenza-Unspecified 03/23/2016  . Pneumococcal Conjugate-13 06/05/2016  . Pneumococcal Polysaccharide-23 06/03/2012  . Td 09/01/2009  . Zoster 08/02/2005    Health Maintenance  Topic Date Due  . Hepatitis C Screening  [birth year]  . TETANUS/TDAP  09/02/2019  . COLONOSCOPY  12/07/2020  . INFLUENZA VACCINE  Completed  . PNA vac Low Risk Adult  Completed     Discussed health benefits of physical activity, and encouraged him to engage in regular exercise appropriate for his age and condition.  1. Coronary artery disease involving coronary bypass graft of native heart without angina pectoris   2. Hypercholesterolemia  - Lipid panel  3. Fatigue, unspecified type  - CBC with Differential/Platelet - Comprehensive metabolic panel - TSH  4. Falls, initial encounter Offered referral to physical therapy which I think is helpful.  She declines.  Also might need neurology referral. Return to clinic in a few weeks. 5. Multiple contusions No imaging necessary.  He is given a prescription for 25 Vicodin for the pain.  I have done the exam and reviewed the chart and it is accurate to the best of my knowledge. Dentist has been used and  any errors in dictation or transcription are unintentional. Julieanne Manson M.D. [facility]    ------------------------------------------------------------------------------------------------------------    Megan Mans, MD  [facility]

## 2018-06-10 NOTE — Progress Notes (Signed)
Subjective:   Jason Tate is a 73 y.o. male who presents for Medicare Annual/Subsequent preventive examination.  Review of Systems:  N/A  Cardiac Risk Factors include: advanced age (>5455men, 108>65 women);hypertension;male gender;sedentary lifestyle;dyslipidemia     Objective:    Vitals: BP 122/84 (BP Location: Right Arm)   Pulse 83   Temp 98.8 F (37.1 C) (Oral)   Ht 6\' 1"  (1.854 m)   Wt 211 lb (95.7 kg)   BMI 27.84 kg/m   Body mass index is 27.84 kg/m.  Advanced Directives 06/10/2018 06/06/2017 06/05/2016 08/01/2015  Does Patient Have a Medical Advance Directive? Yes Yes Yes No  Type of Estate agentAdvance Directive Healthcare Power of [place];Living will Healthcare Power of [place];Living will Living will;Healthcare Power of Attorney -  Copy of Healthcare Power of Attorney in Chart? No - copy requested No - copy requested No - copy requested -    Tobacco Social History   Tobacco Use  Smoking Status Former Smoker  . Packs/day: 1.00  . Years: 20.00  . Pack years: 20.00  . Types: Cigarettes  . Last attempt to quit: 03/28/1980  . Years since quitting: 38.2  Smokeless Tobacco Never Used     Counseling given: Not Answered   Clinical Intake:  Pre-visit preparation completed: Yes  Pain : 0-10 Pain Score: 8  Pain Type: Acute pain Pain Location: Back Pain Orientation: Lower, Right Pain Descriptors / Indicators: Aching, Sore, Radiating Pain Onset: In the past 7 days Pain Frequency: Constant     Nutritional Status: BMI 25 -29 Overweight Nutritional Risks: None Diabetes: No  How often do you need to have someone help you when you read instructions, pamphlets, or other written materials from your doctor or pharmacy?: 1 - Never  Interpreter Needed?: No  Information entered by :: [facility] LLCMmarkoski, LPN  Past Medical History:  Diagnosis Date  . Abnormal stress echo Oct 2012  . Arthritis   . CAD (coronary artery disease) October 2012   s/p CABG x 2 per Dr. Cornelius Moraswen  .  Hypercholesterolemia   . Low testosterone   . Macular degeneration   . 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 Obesity   . PVC (premature ventricular contraction)   . Sleep apnea, obstructive    on BiPAP   Past Surgical History:  Procedure Laterality Date  . COLONOSCOPY    . CORONARY ARTERY BYPASS GRAFT  Oct 2012   LIMA to LAD and left radial to OM  . HEMORRHOIDECTOMY WITH HEMORRHOID BANDING    . INJECTION KNEE     Family History  Problem Relation Age of Onset  . Heart disease Mother   . Stroke Father   . Heart disease Father    Social History   Socioeconomic History  . Marital status: Married    Spouse name: Not on file  . Number of children: 2  . Years of education: Not on file  . Highest education level: Bachelor's degree (e.g., BA, AB, BS)  Occupational History  . Occupation: Designer, television/film setpharmaceutical rep    Comment: retired  Engineer, productionocial Needs  . Financial resource strain: Not hard at all  . Food insecurity:    Worry: Never true    Inability: Never true  . Transportation needs:    Medical: No    Non-medical: No  Tobacco Use  . Smoking status: Former Smoker    Packs/day: 1.00    Years: 20.00    Pack years: 20.00    Types: Cigarettes    Last  attempt to quit: 03/28/1980    Years since quitting: 38.2  . Smokeless tobacco: Never Used  Substance and Sexual Activity  . Alcohol use: Yes    Alcohol/week: 28.0 standard drinks    Types: 28 Cans of beer per week    Comment: 4 beers a night  . Drug use: No  . Sexual activity: Yes  Lifestyle  . Physical activity:    Days per week: 0 days    Minutes per session: 0 min  . Stress: Not at all  Relationships  . Social connections:    Talks on phone: Patient refused    Gets together: Patient refused    Attends religious service: Patient refused    Active member of club or organization: Patient refused    Attends meetings of clubs or organizations: Patient refused    Relationship status: Patient refused    Other Topics Concern  . Not on file  Social History Narrative   I son deceased- sucicide    Outpatient Encounter Medications as of 06/10/2018  Medication Sig  . allopurinol (ZYLOPRIM) 100 MG tablet Take 1 tablet (100 mg total) by mouth daily.  Marland Kitchen aspirin 81 MG tablet Take 81 mg by mouth daily.  . B Complex Vitamins (VITAMIN B COMPLEX PO) Take 1 tablet by mouth daily.   Marland Kitchen buPROPion (WELLBUTRIN XL) 300 MG 24 hr tablet Take 1 tablet (300 mg total) by mouth daily.  . Cetirizine HCl (ZYRTEC ALLERGY PO) Take 1 tablet by mouth daily as needed (allergies).   . Cholecalciferol (VITAMIN D PO) Take 4,000 mg by mouth daily.    . citalopram (CELEXA) 20 MG tablet Take 1 tablet (20 mg total) by mouth daily.  . clonazePAM (KLONOPIN) 1 MG tablet TAKE 1 TABLET BY MOUTH AT BEDTIME AS NEEDED FOR SLEEP  . Misc Natural Products (OSTEO BI-FLEX JOINT SHIELD PO) Take 1 tablet by mouth daily.   . Multiple Vitamins-Minerals (PRESERVISION/LUTEIN PO) Take 1 tablet by mouth daily.   . nabumetone (RELAFEN) 500 MG tablet Take 1 tablet (500 mg total) by mouth 2 (two) times daily as needed.  . Omega-3 Fatty Acids (FISH OIL PO) Take 1 tablet by mouth daily.   Marland Kitchen omeprazole (PRILOSEC) 20 MG capsule Take 1 capsule (20 mg total) by mouth daily.  . sildenafil (REVATIO) 20 MG tablet 1 - 5 PO daily as needed  . simvastatin (ZOCOR) 20 MG tablet Take 1 tablet (20 mg total) by mouth at bedtime.  . fluticasone (FLONASE) 50 MCG/ACT nasal spray Place 1 spray into both nostrils daily as needed for allergies. (Patient not taking: Reported on 06/10/2018)  . FLUZONE HIGH-DOSE 0.5 ML injection TO BE ADMINISTERED BY PHARMACIST FOR IMMUNIZATION   No facility-administered encounter medications on file as of 06/10/2018.     Activities of Daily Living In your present state of health, do you have any difficulty performing the following activities: 06/10/2018  Hearing? N  Vision? Y  Comment Due to Macular Degeneration.   Difficulty  concentrating or making decisions? N  Walking or climbing stairs? Y  Comment Due to right knee pain and strength.   Dressing or bathing? N  Doing errands, shopping? Y  Comment Does not drive.   Preparing Food and eating ? N  Using the Toilet? N  In the past six months, have you accidently leaked urine? Y  Comment Rarely, does not wear protection.   Do you have problems with loss of bowel control? N  Managing your Medications? N  Managing your  Finances? N  Housekeeping or managing your Housekeeping? N  Some recent data might be hidden    Patient Care Team: [name]., MD as PCP - General (Unknown Physician Specialty) [place], Peter M, MD (Cardiology) Maris Berger, MD as Consulting Physician (Ophthalmology) Luciana Axe Alford Highland, MD as Consulting Physician (Ophthalmology) Sharrell Ku, MD as Consulting Physician (Gastroenterology) Durene Romans, MD as Consulting Physician (Orthopedic Surgery)   Assessment:   This is a routine wellness examination for [place].  Exercise Activities and Dietary recommendations Current Exercise Habits: The patient does not participate in regular exercise at present, Exercise limited by: orthopedic condition(s)  Goals    . DIET - INCREASE WATER INTAKE     Recommend to drink at least 6-8 8oz glasses of water per day.    . Exercise      Starting 06/05/17, I will start exercising 3 days a week for 30 minutes.    . Exercise 3x per week (30 min per time)     Pt plans to start back walking 3 days a week for at least 30 minutes.       Fall Risk Fall Risk  06/10/2018 06/06/2017 06/05/2016 04/18/2015  Falls in the past year? 1 Yes No No  Comment - tripped - -  Number falls in past yr: 1 1 - -  Injury with Fall? 0 Yes - -  Comment - tore cartiledge in right knee - -  Follow up Falls prevention discussed Falls prevention discussed - -   FALL RISK PREVENTION PERTAINING TO THE HOME:  Any stairs in or around the home WITH handrails? Yes  Home  free of loose throw rugs in walkways, pet beds, electrical cords, etc? Yes  Adequate lighting in your home to reduce risk of falls? Yes   ASSISTIVE DEVICES UTILIZED TO PREVENT FALLS:  Life alert? No  Use of a cane, walker or w/c? Yes  Grab bars in the bathroom? Yes  Shower chair or bench in shower? No  Elevated toilet seat or a handicapped toilet? No    TIMED UP AND GO:  Was the test performed? No .     Depression Screen PHQ 2/9 Scores 06/10/2018 06/06/2017 06/05/2016 04/18/2015  PHQ - 2 Score 0 4 1 2   PHQ- 9 Score - 6 - 6    Cognitive Function: Declined today.      6CIT Screen 06/05/2016  What Year? 0 points  What month? 0 points  What time? 0 points  Count back from 20 0 points  Months in reverse 0 points  Repeat phrase 0 points  Total Score 0    Immunization History  Administered Date(s) Administered  . Influenza, High Dose Seasonal PF 03/04/2017  . Influenza, Seasonal, Injecte, Preservative Fre 03/23/2015  . Influenza,inj,quad, With Preservative 03/13/2018  . Influenza-Unspecified 03/23/2016  . Pneumococcal Conjugate-13 06/05/2016  . Pneumococcal Polysaccharide-23 06/03/2012  . Td 09/01/2009  . Zoster 08/02/2005    Qualifies for Shingles Vaccine? Yes  Zostavax completed 08/02/05. Due for Shingrix. Education has been provided regarding the importance of this vaccine. Pt has been advised to call insurance company to determine out of pocket expense. Advised may also receive vaccine at local pharmacy or Health Dept. Verbalized acceptance and understanding.  Tdap: Up to date  Flu Vaccine: Up to date  Pneumococcal Vaccine: Up to date   Screening Tests Health Maintenance  Topic Date Due  . Hepatitis C Screening  1945-12-19  . TETANUS/TDAP  09/02/2019  . COLONOSCOPY  12/07/2020  . INFLUENZA  VACCINE  Completed  . PNA vac Low Risk Adult  Completed   Cancer Screenings:  Colorectal Screening: Completed 12/08/15. Repeat every 5 years.  Lung Cancer Screening:  (Low Dose CT Chest recommended if Age 66-80 years, 30 pack-year currently smoking OR have quit w/in 15years.) does not qualify.    Additional Screening:  Hepatitis C Screening: does qualify; however declined order today.   Vision Screening: Recommended annual ophthalmology exams for early detection of glaucoma and other disorders of the eye.  Dental Screening: Recommended annual dental exams for proper oral hygiene  Community Resource Referral:  CRR required this visit?  No        Plan:  I have personally reviewed and addressed the Medicare Annual Wellness questionnaire and have noted the following in the patient's chart:  A. Medical and social history B. Use of alcohol, tobacco or illicit drugs  C. Current medications and supplements D. Functional ability and status E.  Nutritional status F.  Physical activity G. Advance directives H. List of other physicians I.  Hospitalizations, surgeries, and ER visits in previous 12 months J.  Vitals K. Screenings such as hearing and vision if needed, cognitive and depression L. Referrals and appointments - none  In addition, I have reviewed and discussed with patient certain preventive protocols, quality metrics, and best practice recommendations. A written personalized care plan for preventive services as well as general preventive health recommendations were provided to patient.  See attached scanned questionnaire for additional information.   Signed,  Hyacinth MeekerMckenzie Remmy Riffe, LPN Nurse Health Advisor   Nurse Recommendations: Decline the Hepatitis C lab order today.

## 2018-06-10 NOTE — Patient Instructions (Signed)
[name] , Thank you for taking time to come for your Medicare Wellness Visit. I appreciate your ongoing commitment to your health goals. Please review the following plan we discussed and let me know if I can assist you in the future.   Screening recommendations/referrals: Colonoscopy: Up to date, due 11/2020 Recommended yearly ophthalmology/optometry visit for glaucoma screening and checkup Recommended yearly dental visit for hygiene and checkup  Vaccinations: Influenza vaccine: Up to date Pneumococcal vaccine: Completed series Tdap vaccine: Up to date, due 08/2019 Shingles vaccine: Pt declines today.     Advanced directives: Please bring a copy of your POA (Power of Attorney) and/or Living Will to your next appointment.   Conditions/risks identified: Fall risk prevention; Recommend to drink at least 6-8 8oz glasses of water per day.  Next appointment: 10:20 AM today with Dr Sullivan Lone.   Preventive Care 75 Years and Older, Male Preventive care refers to lifestyle choices and visits with your health care provider that can promote health and wellness. What does preventive care include?  A yearly physical exam. This is also called an annual well check.  Dental exams once or twice a year.  Routine eye exams. Ask your health care provider how often you should have your eyes checked.  Personal lifestyle choices, including:  Daily care of your teeth and gums.  Regular physical activity.  Eating a healthy diet.  Avoiding tobacco and drug use.  Limiting alcohol use.  Practicing safe sex.  Taking low doses of aspirin every day.  Taking vitamin and mineral supplements as recommended by your health care provider. What happens during an annual well check? The services and screenings done by your health care provider during your annual well check will depend on your age, overall health, lifestyle risk factors, and family history of disease. Counseling  Your health care provider  may ask you questions about your:  Alcohol use.  Tobacco use.  Drug use.  Emotional well-being.  Home and relationship well-being.  Sexual activity.  Eating habits.  History of falls.  Memory and ability to understand (cognition).  Work and work Astronomer. Screening  You may have the following tests or measurements:  Height, weight, and BMI.  Blood pressure.  Lipid and cholesterol levels. These may be checked every 5 years, or more frequently if you are over 21 years old.  Skin check.  Lung cancer screening. You may have this screening every year starting at age 21 if you have a 30-pack-year history of smoking and currently smoke or have quit within the past 15 years.  Fecal occult blood test (FOBT) of the stool. You may have this test every year starting at age 75.  Flexible sigmoidoscopy or colonoscopy. You may have a sigmoidoscopy every 5 years or a colonoscopy every 10 years starting at age 63.  Prostate cancer screening. Recommendations will vary depending on your family history and other risks.  Hepatitis C blood test.  Hepatitis B blood test.  Sexually transmitted disease (STD) testing.  Diabetes screening. This is done by checking your blood sugar (glucose) after you have not eaten for a while (fasting). You may have this done every 1-3 years.  Abdominal aortic aneurysm (AAA) screening. You may need this if you are a current or former smoker.  Osteoporosis. You may be screened starting at age 52 if you are at high risk. Talk with your health care provider about your test results, treatment options, and if necessary, the need for more tests. Vaccines  Your health care  provider may recommend certain vaccines, such as:  Influenza vaccine. This is recommended every year.  Tetanus, diphtheria, and acellular pertussis (Tdap, Td) vaccine. You may need a Td booster every 10 years.  Zoster vaccine. You may need this after age 47.  Pneumococcal 13-valent  conjugate (PCV13) vaccine. One dose is recommended after age 31.  Pneumococcal polysaccharide (PPSV23) vaccine. One dose is recommended after age 50. Talk to your health care provider about which screenings and vaccines you need and how often you need them. This information is not intended to replace advice given to you by your health care provider. Make sure you discuss any questions you have with your health care provider. Document Released: 06/10/2015 Document Revised: 02/01/2016 Document Reviewed: 03/15/2015 Elsevier Interactive Patient Education  2017 [place] Prevention in the Home Falls can cause injuries. They can happen to people of all ages. There are many things you can do to make your home safe and to help prevent falls. What can I do on the outside of my home?  Regularly fix the edges of walkways and driveways and fix any cracks.  Remove anything that might make you trip as you walk through a door, such as a raised step or threshold.  Trim any bushes or trees on the path to your home.  Use bright outdoor lighting.  Clear any walking paths of anything that might make someone trip, such as rocks or tools.  Regularly check to see if handrails are loose or broken. Make sure that both sides of any steps have handrails.  Any raised decks and porches should have guardrails on the edges.  Have any leaves, snow, or ice cleared regularly.  Use sand or salt on walking paths during winter.  Clean up any spills in your garage right away. This includes oil or grease spills. What can I do in the bathroom?  Use night lights.  Install grab bars by the toilet and in the tub and shower. Do not use towel bars as grab bars.  Use non-skid mats or decals in the tub or shower.  If you need to sit down in the shower, use a plastic, non-slip stool.  Keep the floor dry. Clean up any water that spills on the floor as soon as it happens.  Remove soap buildup in the tub or  shower regularly.  Attach bath mats securely with double-sided non-slip rug tape.  Do not have throw rugs and other things on the floor that can make you trip. What can I do in the bedroom?  Use night lights.  Make sure that you have a light by your bed that is easy to reach.  Do not use any sheets or blankets that are too big for your bed. They should not hang down onto the floor.  Have a firm chair that has side arms. You can use this for support while you get dressed.  Do not have throw rugs and other things on the floor that can make you trip. What can I do in the kitchen?  Clean up any spills right away.  Avoid walking on wet floors.  Keep items that you use a lot in easy-to-reach places.  If you need to reach something above you, use a strong step stool that has a grab bar.  Keep electrical cords out of the way.  Do not use floor polish or wax that makes floors slippery. If you must use wax, use non-skid floor wax.  Do not have throw  rugs and other things on the floor that can make you trip. What can I do with my stairs?  Do not leave any items on the stairs.  Make sure that there are handrails on both sides of the stairs and use them. Fix handrails that are broken or loose. Make sure that handrails are as long as the stairways.  Check any carpeting to make sure that it is firmly attached to the stairs. Fix any carpet that is loose or worn.  Avoid having throw rugs at the top or bottom of the stairs. If you do have throw rugs, attach them to the floor with carpet tape.  Make sure that you have a light switch at the top of the stairs and the bottom of the stairs. If you do not have them, ask someone to add them for you. What else can I do to help prevent falls?  Wear shoes that:  Do not have high heels.  Have rubber bottoms.  Are comfortable and fit you well.  Are closed at the toe. Do not wear sandals.  If you use a stepladder:  Make sure that it is fully  opened. Do not climb a closed stepladder.  Make sure that both sides of the stepladder are locked into place.  Ask someone to hold it for you, if possible.  Clearly mark and make sure that you can see:  Any grab bars or handrails.  First and last steps.  Where the edge of each step is.  Use tools that help you move around (mobility aids) if they are needed. These include:  Canes.  Walkers.  Scooters.  Crutches.  Turn on the lights when you go into a dark area. Replace any light bulbs as soon as they burn out.  Set up your furniture so you have a clear path. Avoid moving your furniture around.  If any of your floors are uneven, fix them.  If there are any pets around you, be aware of where they are.  Review your medicines with your doctor. Some medicines can make you feel dizzy. This can increase your chance of falling. Ask your doctor what other things that you can do to help prevent falls. This information is not intended to replace advice given to you by your health care provider. Make sure you discuss any questions you have with your health care provider. Document Released: 03/10/2009 Document Revised: 10/20/2015 Document Reviewed: 06/18/2014 Elsevier Interactive Patient Education  2017 [employer].

## 2018-06-11 LAB — COMPREHENSIVE METABOLIC PANEL
ALT: 20 IU/L (ref 0–44)
AST: 20 IU/L (ref 0–40)
Albumin/Globulin Ratio: 2.1 (ref 1.2–2.2)
Albumin: 4.7 g/dL (ref 3.5–4.8)
Alkaline Phosphatase: 62 IU/L (ref 39–117)
BUN/Creatinine Ratio: 13 (ref 10–24)
BUN: 14 mg/dL (ref 8–27)
Bilirubin Total: 0.9 mg/dL (ref 0.0–1.2)
CO2: 22 mmol/L (ref 20–29)
Calcium: 9.7 mg/dL (ref 8.6–10.2)
Chloride: 97 mmol/L (ref 96–106)
Creatinine, Ser: 1.04 mg/dL (ref 0.76–1.27)
GFR calc Af Amer: 81 mL/min/{1.73_m2} (ref >59)
GFR calc non Af Amer: 70 mL/min/{1.73_m2} (ref >59)
Globulin, Total: 2.2 g/dL (ref 1.5–4.5)
Glucose: 102 mg/dL — ABNORMAL HIGH (ref 65–99)
Potassium: 4.3 mmol/L (ref 3.5–5.2)
Sodium: 137 mmol/L (ref 134–144)
Total Protein: 6.9 g/dL (ref 6.0–8.5)

## 2018-06-11 LAB — TSH: TSH: 1.34 u[IU]/mL (ref 0.450–4.500)

## 2018-06-11 LAB — CBC WITH DIFFERENTIAL/PLATELET
Basophils Absolute: 0.1 10*3/uL (ref 0.0–0.2)
Basos: 0 %
EOS (ABSOLUTE): 0.1 10*3/uL (ref 0.0–0.4)
Eos: 1 %
Hematocrit: 31.7 % — ABNORMAL LOW (ref 37.5–51.0)
Hemoglobin: 11.2 g/dL — ABNORMAL LOW (ref 13.0–17.7)
Immature Grans (Abs): 0 10*3/uL (ref 0.0–0.1)
Immature Granulocytes: 0 %
Lymphocytes Absolute: 0.9 10*3/uL (ref 0.7–3.1)
Lymphs: 8 %
MCH: 33.9 pg — ABNORMAL HIGH (ref 26.6–33.0)
MCHC: 35.3 g/dL (ref 31.5–35.7)
MCV: 96 fL (ref 79–97)
Monocytes Absolute: 1 10*3/uL — ABNORMAL HIGH (ref 0.1–0.9)
Monocytes: 9 %
Neutrophils Absolute: 9.2 10*3/uL — ABNORMAL HIGH (ref 1.4–7.0)
Neutrophils: 82 %
Platelets: 177 10*3/uL (ref 150–450)
RBC: 3.3 x10E6/uL — ABNORMAL LOW (ref 4.14–5.80)
RDW: 12.3 % (ref 11.6–15.4)
WBC: 11.3 10*3/uL — ABNORMAL HIGH (ref 3.4–10.8)

## 2018-06-11 LAB — LIPID PANEL
Chol/HDL Ratio: 2.2 ratio (ref 0.0–5.0)
Cholesterol, Total: 149 mg/dL (ref 100–199)
HDL: 67 mg/dL (ref >39)
LDL Calculated: 65 mg/dL (ref 0–99)
Triglycerides: 83 mg/dL (ref 0–149)
VLDL Cholesterol Cal: 17 mg/dL (ref 5–40)

## 2018-06-13 ENCOUNTER — Telehealth: Payer: Self-pay

## 2018-06-13 NOTE — Telephone Encounter (Signed)
[id number] 

## 2018-06-13 NOTE — Telephone Encounter (Signed)
-----   Message from [name]., MD sent at 06/12/2018  8:50 AM EST ----- Labs stable.

## 2018-06-16 NOTE — Telephone Encounter (Signed)
Pt advised.   Thanks,   -Laura  

## 2018-06-27 DIAGNOSIS — M545 Low back pain: Secondary | ICD-10-CM | POA: Diagnosis not present

## 2018-06-27 DIAGNOSIS — M1712 Unilateral primary osteoarthritis, left knee: Secondary | ICD-10-CM | POA: Diagnosis not present

## 2018-06-27 DIAGNOSIS — M25562 Pain in left knee: Secondary | ICD-10-CM | POA: Diagnosis not present

## 2018-06-27 DIAGNOSIS — M25561 Pain in right knee: Secondary | ICD-10-CM | POA: Diagnosis not present

## 2018-07-01 DIAGNOSIS — H3554 Dystrophies primarily involving the retinal pigment epithelium: Secondary | ICD-10-CM | POA: Diagnosis not present

## 2018-07-01 DIAGNOSIS — H524 Presbyopia: Secondary | ICD-10-CM | POA: Diagnosis not present

## 2018-07-01 DIAGNOSIS — H52203 Unspecified astigmatism, bilateral: Secondary | ICD-10-CM | POA: Diagnosis not present

## 2018-07-01 DIAGNOSIS — Z961 Presence of intraocular lens: Secondary | ICD-10-CM | POA: Diagnosis not present

## 2018-07-18 DIAGNOSIS — R2689 Other abnormalities of gait and mobility: Secondary | ICD-10-CM | POA: Diagnosis not present

## 2018-07-21 NOTE — Progress Notes (Signed)
Jason Tate Date of Birth: 03-03-1946 Medical Record #[id number]  History of Present Illness: Jason Tate is seen for follow up of CAD. He is status post CABG in October 2012. He presented at that time with chest pain and a markedly abnormal stress Echo. He has a history of second degree heart block that resolved with stopping beta blocker therapy. He has a histor of OSA,hypercholesterolemia and gout. Myoview study in March 2018 showed evidence of prior infarct without ischemia. EF 48%.   On follow up today he is doing well from a cardiac standpoint. He denies any chest pain, dyspnea, palpitations or dizziness. He did have 2 falls in January and fractured L1. He is getting balance PT now.  Notes instability in his knee. Followed by Ortho.   Current Outpatient Medications on File Prior to Visit  Medication Sig Dispense Refill  . allopurinol (ZYLOPRIM) 100 MG tablet Take 1 tablet (100 mg total) by mouth daily. 90 tablet 3  . aspirin 81 MG tablet Take 81 mg by mouth daily.    . B Complex Vitamins (VITAMIN B COMPLEX PO) Take 1 tablet by mouth daily.     Marland Kitchen buPROPion (WELLBUTRIN XL) 300 MG 24 hr tablet Take 1 tablet (300 mg total) by mouth daily. 90 tablet 3  . Cetirizine HCl (ZYRTEC ALLERGY PO) Take 1 tablet by mouth daily as needed (allergies).     . Cholecalciferol (VITAMIN D PO) Take 4,000 mg by mouth daily.      . citalopram (CELEXA) 20 MG tablet Take 1 tablet (20 mg total) by mouth daily. 90 tablet 3  . clonazePAM (KLONOPIN) 1 MG tablet Take 1 tablet (1 mg total) by mouth at bedtime as needed. for sleep 90 tablet 1  . fluticasone (FLONASE) 50 MCG/ACT nasal spray Place 1 spray into both nostrils daily as needed for allergies. 16 g 3  . FLUZONE HIGH-DOSE 0.5 ML injection TO BE ADMINISTERED BY PHARMACIST FOR IMMUNIZATION  0  . Misc Natural Products (OSTEO BI-FLEX JOINT SHIELD PO) Take 1 tablet by mouth daily.     . Multiple Vitamins-Minerals (PRESERVISION/LUTEIN PO) Take 1 tablet by mouth  daily.     . nabumetone (RELAFEN) 500 MG tablet Take 1 tablet (500 mg total) by mouth 2 (two) times daily as needed. 180 tablet 3  . Omega-3 Fatty Acids (FISH OIL PO) Take 1 tablet by mouth daily.     Marland Kitchen omeprazole (PRILOSEC) 20 MG capsule Take 1 capsule (20 mg total) by mouth daily. 90 capsule 3  . sildenafil (REVATIO) 20 MG tablet 1 - 5 PO daily as needed 50 tablet 11  . simvastatin (ZOCOR) 20 MG tablet Take 1 tablet (20 mg total) by mouth at bedtime. 90 tablet 3   No current facility-administered medications on file prior to visit.     No Known Allergies  Past Medical History:  Diagnosis Date  . Abnormal stress echo Oct 2012  . Arthritis   . CAD (coronary artery disease) October 2012   s/p CABG x 2 per Dr. Cornelius Moras  . Hypercholesterolemia   . Low testosterone   . Macular degeneration   . 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 Obesity   . PVC (premature ventricular contraction)   . Sleep apnea, obstructive    on BiPAP    Past Surgical History:  Procedure Laterality Date  . COLONOSCOPY    . CORONARY ARTERY BYPASS GRAFT  Oct 2012   LIMA to LAD and left radial to  OM  . HEMORRHOIDECTOMY WITH HEMORRHOID BANDING    . INJECTION KNEE      Social History   Tobacco Use  Smoking Status Former Smoker  . Packs/day: 1.00  . Years: 20.00  . Pack years: 20.00  . Types: Cigarettes  . Last attempt to quit: 03/28/1980  . Years since quitting: 38.3  Smokeless Tobacco Never Used    Social History   Substance and Sexual Activity  Alcohol Use Yes  . Alcohol/week: 28.0 standard drinks  . Types: 28 Cans of beer per week   Comment: 4 beers a night    Family History  Problem Relation Age of Onset  . Heart disease Mother   . Stroke Father   . Heart disease Father     Review of Systems: The review of systems is per the HPI.  All other systems were reviewed and are negative.  Physical Exam: BP 122/70   Pulse 88   Ht 6\' 1"  (1.854 m)   Wt 203 lb 9.6 oz  (92.4 kg)   BMI 26.86 kg/m  GENERAL:  Well appearing WM in NAD HEENT:  PERRL, EOMI, sclera are clear. Oropharynx is clear. NECK:  No jugular venous distention, carotid upstroke brisk and symmetric, no bruits, no thyromegaly or adenopathy LUNGS:  Clear to auscultation bilaterally CHEST:  Unremarkable HEART:  RRR,  PMI not displaced or sustained,S1 and S2 within normal limits, no S3, no S4: no clicks, no rubs, no murmurs ABD:  Soft, nontender. BS +, no masses or bruits. No hepatomegaly, no splenomegaly EXT:  2 + pulses throughout, no edema, no cyanosis no clubbing SKIN:  Warm and dry.  No rashes NEURO:  Alert and oriented x 3. Cranial nerves II through XII intact. PSYCH:  Cognitively intact   LABORATORY DATA: Lab Results  Component Value Date   WBC 11.3 (H) 06/10/2018   HGB 11.2 (L) 06/10/2018   HCT 31.7 (L) 06/10/2018   PLT 177 06/10/2018   GLUCOSE 102 (H) 06/10/2018   CHOL 149 06/10/2018   TRIG 83 06/10/2018   HDL 67 06/10/2018   LDLCALC 65 06/10/2018   ALT 20 06/10/2018   AST 20 06/10/2018   NA 137 06/10/2018   K 4.3 06/10/2018   CL 97 06/10/2018   CREATININE 1.04 06/10/2018   BUN 14 06/10/2018   CO2 22 06/10/2018   TSH 1.340 06/10/2018   INR 1.26 03/23/2011   HGBA1C 5.2 03/22/2011   Ecg today shows NSR with rate 88, sinus arrhythmia.  Septal infarct age undetermined. I have personally reviewed and interpreted this study.  Myoview 08/02/16: Study Highlights     Nuclear stress EF: 48%.  The left ventricular ejection fraction is mildly decreased (45-54%).  Blood pressure demonstrated a blunted response to exercise.  No T wave inversion was noted during stress.  Horizontal ST segment depression ST segment depression of 1 mm was noted during stress in the II, III, aVF, V6, V5 and V4 leads.  Defect 1: There is a medium defect of moderate severity.  Findings consistent with prior myocardial infarction.  This is an intermediate risk study.   Moderate sized,  medium intensity fixed anteroseptal, anteroapical, apical and inferoapical perfusion defect likely scar. No significant reversible ischemia. LVEF 48% with distal anteroapical and apical severe hypokinesis to akinesis. This is an intermediate risk study.    Assessment / Plan: 1. Coronary disease status post CABG in October 2012. Stress Myoview in March 2018 without ischemia. 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  He has no anginal  symptoms. Continue  current medication.   2. History of Mobitz type 1 second degree AV block. Resolved with stopping metoprolol.   3. Hyperlipidemia. Excellent control on Zocor.   I will follow up in one year.

## 2018-07-23 ENCOUNTER — Ambulatory Visit (INDEPENDENT_AMBULATORY_CARE_PROVIDER_SITE_OTHER): Payer: Medicare Other | Admitting: Family Medicine

## 2018-07-23 VITALS — BP 124/60 | HR 88 | Temp 97.9°F | Resp 18 | Wt 206.0 lb

## 2018-07-23 DIAGNOSIS — W19XXXD Unspecified fall, subsequent encounter: Secondary | ICD-10-CM

## 2018-07-23 DIAGNOSIS — M1A0220 Idiopathic chronic gout, left elbow, without tophus (tophi): Secondary | ICD-10-CM | POA: Diagnosis not present

## 2018-07-23 DIAGNOSIS — I25810 Atherosclerosis of coronary artery bypass graft(s) without angina pectoris: Secondary | ICD-10-CM

## 2018-07-23 DIAGNOSIS — F32A Depression, unspecified: Secondary | ICD-10-CM

## 2018-07-23 DIAGNOSIS — K219 Gastro-esophageal reflux disease without esophagitis: Secondary | ICD-10-CM

## 2018-07-23 DIAGNOSIS — G4709 Other insomnia: Secondary | ICD-10-CM

## 2018-07-23 DIAGNOSIS — F329 Major depressive disorder, single episode, unspecified: Secondary | ICD-10-CM | POA: Diagnosis not present

## 2018-07-23 DIAGNOSIS — M109 Gout, unspecified: Secondary | ICD-10-CM

## 2018-07-23 MED ORDER — OMEPRAZOLE 20 MG PO CPDR: 20.0000 mg | DELAYED_RELEASE_CAPSULE | Freq: Every day | ORAL | 3 refills | Status: DC

## 2018-07-23 MED ORDER — SIMVASTATIN 20 MG PO TABS: 20.0000 mg | ORAL_TABLET | Freq: Every day | ORAL | 3 refills | Status: DC

## 2018-07-23 MED ORDER — ALLOPURINOL 100 MG PO TABS: 100.0000 mg | ORAL_TABLET | Freq: Every day | ORAL | 3 refills | Status: DC

## 2018-07-23 MED ORDER — CITALOPRAM HYDROBROMIDE 20 MG PO TABS: 20.0000 mg | ORAL_TABLET | Freq: Every day | ORAL | 3 refills | Status: DC

## 2018-07-23 MED ORDER — CLONAZEPAM 1 MG PO TABS: 1.0000 mg | ORAL_TABLET | Freq: Every evening | ORAL | 1 refills | Status: DC | PRN

## 2018-07-23 MED ORDER — NABUMETONE 500 MG PO TABS: 500.0000 mg | ORAL_TABLET | Freq: Two times a day (BID) | ORAL | 3 refills | Status: DC | PRN

## 2018-07-23 MED ORDER — BUPROPION HCL ER (XL) 300 MG PO TB24: 300.0000 mg | ORAL_TABLET | Freq: Every day | ORAL | 3 refills | Status: DC

## 2018-07-23 NOTE — Progress Notes (Signed)
Jason Tate  MRN: [id number] DOB: 1945-10-21  Subjective:  HPI   The patient is a 73 year old male who presents for follow up falls.  The patient had 2 falls prior to the last visit that resulted in some contusions and a fracture of L1.  The patient is currently being followed by Orthopedics for his injuries and also getting physical therapy 2 times per week.  He has had no recent falls.  He sees Dr.Olin.  He is more stable than on his last visit. 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 Heart disease/angina is stable.  All risk factors treated.  He is status post CABG x2. Gout is stable recently.  Patient Active Problem List   Diagnosis Date Noted  . Depression 04/18/2015  . OSA (obstructive sleep apnea) 04/18/2015  . Leg edema, left 11/02/2013  . 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 CAD (coronary artery disease) 04/09/2011  . Gout 04/09/2011  . Fatigue 03/05/2011  . Abnormal ECG 03/05/2011  . Hypercholesterolemia   . PVC (premature ventricular contraction)     Past Medical History:  Diagnosis Date  . Abnormal stress echo Oct 2012  . Arthritis   . CAD (coronary artery disease) October 2012   s/p CABG x 2 per Dr. Cornelius Moras  . Hypercholesterolemia   . Low testosterone   . Macular degeneration   . 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 Obesity   . PVC (premature ventricular contraction)   . Sleep apnea, obstructive    on BiPAP    Social History   Socioeconomic History  . Marital status: Married    Spouse name: Not on file  . Number of children: 2  . Years of education: Not on file  . Highest education level: Bachelor's degree (e.g., BA, AB, BS)  Occupational History  . 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 Financial resource strain: Not hard at all  . Food insecurity:    Worry: Never true    Inability: Never true  .  Transportation needs:    Medical: No    Non-medical: No  Tobacco Use  . Smoking status: Former Smoker    Packs/day: 1.00    Years: 20.00    Pack years: 20.00    Types: Cigarettes    Last attempt to quit: 03/28/1980    Years since quitting: 38.3  . Smokeless tobacco: Never Used  Substance and Sexual Activity  . Alcohol use: Yes    Alcohol/week: 28.0 standard drinks    Types: 28 Cans of beer per week    Comment: 4 beers a night  . Drug use: No  . Sexual activity: Yes  Lifestyle  . Physical activity:    Days per week: 0 days    Minutes per session: 0 min  . Stress: Not at all  Relationships  . Social connections:    Talks on phone: Patient refused    Gets together: Patient refused    Attends religious service: Patient refused    Active member of club or organization: Patient refused    Attends meetings of clubs or organizations: Patient refused    Relationship status: Patient refused  . Intimate partner violence:    Fear of current or ex partner: No    Emotionally abused: No    Physically abused: No    Forced sexual activity: No  Other Topics Concern  .  Not on file  Social History Narrative   I son deceased- sucicide    Outpatient Encounter Medications as of 07/23/2018  Medication Sig  . allopurinol (ZYLOPRIM) 100 MG tablet Take 1 tablet (100 mg total) by mouth daily.  Marland Kitchen aspirin 81 MG tablet Take 81 mg by mouth daily.  . B Complex Vitamins (VITAMIN B COMPLEX PO) Take 1 tablet by mouth daily.   Marland Kitchen buPROPion (WELLBUTRIN XL) 300 MG 24 hr tablet Take 1 tablet (300 mg total) by mouth daily.  . Cetirizine HCl (ZYRTEC ALLERGY PO) Take 1 tablet by mouth daily as needed (allergies).   . Cholecalciferol (VITAMIN D PO) Take 4,000 mg by mouth daily.    . citalopram (CELEXA) 20 MG tablet Take 1 tablet (20 mg total) by mouth daily.  . clonazePAM (KLONOPIN) 1 MG tablet TAKE 1 TABLET BY MOUTH AT BEDTIME AS NEEDED FOR SLEEP  . Misc Natural Products (OSTEO BI-FLEX JOINT SHIELD PO) Take 1  tablet by mouth daily.   . Multiple Vitamins-Minerals (PRESERVISION/LUTEIN PO) Take 1 tablet by mouth daily.   . nabumetone (RELAFEN) 500 MG tablet Take 1 tablet (500 mg total) by mouth 2 (two) times daily as needed.  . Omega-3 Fatty Acids (FISH OIL PO) Take 1 tablet by mouth daily.   Marland Kitchen omeprazole (PRILOSEC) 20 MG capsule Take 1 capsule (20 mg total) by mouth daily.  . sildenafil (REVATIO) 20 MG tablet 1 - 5 PO daily as needed  . simvastatin (ZOCOR) 20 MG tablet Take 1 tablet (20 mg total) by mouth at bedtime.  . fluticasone (FLONASE) 50 MCG/ACT nasal spray Place 1 spray into both nostrils daily as needed for allergies. (Patient not taking: Reported on 07/23/2018)  . FLUZONE HIGH-DOSE 0.5 ML injection TO BE ADMINISTERED BY PHARMACIST FOR IMMUNIZATION  . HYDROcodone-acetaminophen (NORCO) 10-325 MG tablet Take 1 tablet by mouth every 8 (eight) hours as needed. (Patient not taking: Reported on 07/23/2018)   No facility-administered encounter medications on file as of 07/23/2018.     No Known Allergies  Review of Systems  Constitutional: Negative.   Eyes: Positive for blurred vision.        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 Negative for shortness of breath and wheezing.   Cardiovascular: Negative for chest pain, palpitations and leg swelling.  Gastrointestinal: Negative.   Musculoskeletal: Positive for back pain, joint pain and myalgias. Negative for falls (not since his last visit) and neck pain.  Skin: Negative.   Endo/Heme/Allergies: Negative.   Psychiatric/Behavioral: Negative.     Objective:  BP 124/60 (BP Location: Right Arm, Patient Position: Sitting, Cuff Size: Normal)   Pulse 88   Temp 97.9 F (36.6 C) (Oral)   Resp 18   Wt 206 lb (93.4 kg)   SpO2 96%   BMI 27.18 kg/m   Physical Exam  Constitutional: He is oriented to person, place, and time and well-developed, well-nourished, and in no distress.  HENT:  Head: Normocephalic  and atraumatic.  Right Ear: External ear normal.  Left Ear: External ear normal.  Nose: Nose normal.  Mouth/Throat: Oropharynx is clear and moist.  Eyes: Pupils are equal, round, and reactive to light. Conjunctivae and EOM are normal.  Neck: Normal range of motion. Neck supple.  Cardiovascular: Normal rate, regular rhythm, normal heart sounds and intact distal pulses.  Pulmonary/Chest: Effort normal and breath sounds normal.  Abdominal: Soft. Bowel sounds are normal.  Musculoskeletal: Normal range of motion.  Neurological: He is alert and oriented  to person, place, and time. Gait normal. GCS score is 15.  Skin: Skin is warm and dry.  Psychiatric: Mood, memory, affect and judgment normal.    Assessment and Plan :  1. Gout of knee, unspecified cause, unspecified chronicity, unspecified laterality - allopurinol (ZYLOPRIM) 100 MG tablet; Take 1 tablet (100 mg total) by mouth daily.  Dispense: 90 tablet; Refill: 3  2. Depression, unspecified depression type Remission.  Continue present meds indefinitely. - buPROPion (WELLBUTRIN XL) 300 MG 24 hr tablet; Take 1 tablet (300 mg total) by mouth daily.  Dispense: 90 tablet; Refill: 3 - citalopram (CELEXA) 20 MG tablet; Take 1 tablet (20 mg total) by mouth daily.  Dispense: 90 tablet; Refill: 3  3. Other insomnia Advised patient to start cut back on clonazepam. - clonazePAM (KLONOPIN) 1 MG tablet; Take 1 tablet (1 mg total) by mouth at bedtime as needed. for sleep  Dispense: 90 tablet; Refill: 1  4. Chronic gout of left elbow, unspecified cause  - nabumetone (RELAFEN) 500 MG tablet; Take 1 tablet (500 mg total) by mouth 2 (two) times daily as needed.  Dispense: 180 tablet; Refill: 3  5. Gastroesophageal reflux disease, esophagitis presence not specified  - omeprazole (PRILOSEC) 20 MG capsule; Take 1 capsule (20 mg total) by mouth daily.  Dispense: 90 capsule; Refill: 3  6. Coronary artery disease involving coronary bypass graft of native  heart without angina pectoris All risk factors treated. - simvastatin (ZOCOR) 20 MG tablet; Take 1 tablet (20 mg total) by mouth at bedtime.  Dispense: 90 tablet; Refill: 3  7. Fall, subsequent encounter This is my biggest concern for this patient.  An L1 compression fracture which is followed with time.  This was from his most recent falls.  Macular degeneration certainly contributes to this.  Weakness with aging I think contributes to this.

## 2018-07-24 ENCOUNTER — Encounter: Payer: Self-pay | Admitting: Cardiology

## 2018-07-24 ENCOUNTER — Ambulatory Visit (INDEPENDENT_AMBULATORY_CARE_PROVIDER_SITE_OTHER): Payer: Medicare Other | Admitting: Cardiology

## 2018-07-24 VITALS — BP 122/70 | HR 88 | Ht 73.0 in | Wt 203.6 lb

## 2018-07-24 DIAGNOSIS — E7800 Pure hypercholesterolemia, unspecified: Secondary | ICD-10-CM | POA: Diagnosis not present

## 2018-07-24 DIAGNOSIS — I25810 Atherosclerosis of coronary artery bypass graft(s) without angina pectoris: Secondary | ICD-10-CM | POA: Diagnosis not present

## 2018-07-24 DIAGNOSIS — Z951 Presence of aortocoronary bypass graft: Secondary | ICD-10-CM

## 2018-07-25 DIAGNOSIS — R2689 Other abnormalities of gait and mobility: Secondary | ICD-10-CM | POA: Diagnosis not present

## 2018-08-01 DIAGNOSIS — R2689 Other abnormalities of gait and mobility: Secondary | ICD-10-CM | POA: Diagnosis not present

## 2018-08-05 DIAGNOSIS — R2689 Other abnormalities of gait and mobility: Secondary | ICD-10-CM | POA: Diagnosis not present

## 2018-08-07 DIAGNOSIS — M170 Bilateral primary osteoarthritis of knee: Secondary | ICD-10-CM | POA: Diagnosis not present

## 2018-08-08 DIAGNOSIS — R2689 Other abnormalities of gait and mobility: Secondary | ICD-10-CM | POA: Diagnosis not present

## 2018-08-13 DIAGNOSIS — R2689 Other abnormalities of gait and mobility: Secondary | ICD-10-CM | POA: Diagnosis not present

## 2018-09-22 ENCOUNTER — Ambulatory Visit (INDEPENDENT_AMBULATORY_CARE_PROVIDER_SITE_OTHER): Payer: Medicare Other | Admitting: Family Medicine

## 2018-09-22 ENCOUNTER — Other Ambulatory Visit: Payer: Self-pay

## 2018-09-22 ENCOUNTER — Encounter: Payer: Self-pay | Admitting: Family Medicine

## 2018-09-22 VITALS — BP 124/70 | HR 72 | Temp 98.3°F | Resp 16 | Ht 73.0 in | Wt 203.0 lb

## 2018-09-22 DIAGNOSIS — M19011 Primary osteoarthritis, right shoulder: Secondary | ICD-10-CM | POA: Diagnosis not present

## 2018-09-22 DIAGNOSIS — I25810 Atherosclerosis of coronary artery bypass graft(s) without angina pectoris: Secondary | ICD-10-CM

## 2018-09-22 DIAGNOSIS — J209 Acute bronchitis, unspecified: Secondary | ICD-10-CM

## 2018-09-22 DIAGNOSIS — G4733 Obstructive sleep apnea (adult) (pediatric): Secondary | ICD-10-CM

## 2018-09-22 DIAGNOSIS — J440 Chronic obstructive pulmonary disease with acute lower respiratory infection: Secondary | ICD-10-CM

## 2018-09-22 DIAGNOSIS — H3530 Unspecified macular degeneration: Secondary | ICD-10-CM | POA: Diagnosis not present

## 2018-09-22 DIAGNOSIS — Z951 Presence of aortocoronary bypass graft: Secondary | ICD-10-CM | POA: Diagnosis not present

## 2018-09-22 DIAGNOSIS — M5412 Radiculopathy, cervical region: Secondary | ICD-10-CM

## 2018-09-22 NOTE — Progress Notes (Signed)
Patient: Jason SouCharles L Redler Male    DOB: 09/06/1945   73 y.o.   MRN: [id number] Visit Date: 09/22/2018  Today's Provider: Megan Mansichard Gilbert Jr, MD   Chief Complaint  Patient presents with  . URI  . Arm Pain   Subjective:     URI   This is a new problem. The current episode started 1 to 4 weeks ago (about 4 weeks). The problem has been unchanged. There has been no fever. Associated symptoms include congestion, coughing, neck pain and rhinorrhea. He has tried decongestant for the symptoms. The treatment provided mild relief.  He has had very mild chest congestion with occasional cough for the past month.  This is getting better.  He has had no fever or shortness of breath or anybody exposed to COVID-19.  He does have some allergies. Arm pain Patient reports that he has limited ROM in his right arm. He reports that he has went to physical therapy for this, but he is still having pain.  The therapist thinks it might be degenerative disc disease and is given him some exercises to work on this.  He does feel some relief with some of these exercises.  He has decreased range of motion when abducting his shoulder and also complains of some mild decreased strength in his right arm.  He is right-handed.  No Known Allergies   Current Outpatient Medications:  .  allopurinol (ZYLOPRIM) 100 MG tablet, Take 1 tablet (100 mg total) by mouth daily., Disp: 90 tablet, Rfl: 3 .  aspirin 81 MG tablet, Take 81 mg by mouth daily., Disp: , Rfl:  .  B Complex Vitamins (VITAMIN B COMPLEX PO), Take 1 tablet by mouth daily. , Disp: , Rfl:  .  buPROPion (WELLBUTRIN XL) 300 MG 24 hr tablet, Take 1 tablet (300 mg total) by mouth daily., Disp: 90 tablet, Rfl: 3 .  Cetirizine HCl (ZYRTEC ALLERGY PO), Take 1 tablet by mouth daily as needed (allergies). , Disp: , Rfl:  .  Cholecalciferol (VITAMIN D PO), Take 4,000 mg by mouth daily.  , Disp: , Rfl:  .  citalopram (CELEXA) 20 MG tablet, Take 1 tablet (20 mg total) by  mouth daily., Disp: 90 tablet, Rfl: 3 .  clonazePAM (KLONOPIN) 1 MG tablet, Take 1 tablet (1 mg total) by mouth at bedtime as needed. for sleep, Disp: 90 tablet, Rfl: 1 .  fluticasone (FLONASE) 50 MCG/ACT nasal spray, Place 1 spray into both nostrils daily as needed for allergies., Disp: 16 g, Rfl: 3 .  FLUZONE HIGH-DOSE 0.5 ML injection, TO BE ADMINISTERED BY PHARMACIST FOR IMMUNIZATION, Disp: , Rfl: 0 .  Misc Natural Products (OSTEO BI-FLEX JOINT SHIELD PO), Take 1 tablet by mouth daily. , Disp: , Rfl:  .  Multiple Vitamins-Minerals (PRESERVISION/LUTEIN PO), Take 1 tablet by mouth daily. , Disp: , Rfl:  .  nabumetone (RELAFEN) 500 MG tablet, Take 1 tablet (500 mg total) by mouth 2 (two) times daily as needed., Disp: 180 tablet, Rfl: 3 .  Omega-3 Fatty Acids (FISH OIL PO), Take 1 tablet by mouth daily. , Disp: , Rfl:  .  omeprazole (PRILOSEC) 20 MG capsule, Take 1 capsule (20 mg total) by mouth daily., Disp: 90 capsule, Rfl: 3 .  sildenafil (REVATIO) 20 MG tablet, 1 - 5 PO daily as needed, Disp: 50 tablet, Rfl: 11 .  simvastatin (ZOCOR) 20 MG tablet, Take 1 tablet (20 mg total) by mouth at bedtime., Disp: 90 tablet, Rfl: 3  Review of Systems  Constitutional: Negative for appetite change, chills and fatigue.  HENT: Positive for congestion and rhinorrhea.   Eyes: Positive for visual disturbance.  Respiratory: Positive for cough.   Cardiovascular: Negative.   Endocrine: Negative.   Musculoskeletal: Positive for arthralgias, neck pain and neck stiffness.  Allergic/Immunologic: Negative.   Neurological: Positive for weakness.  Psychiatric/Behavioral: Negative.     Social History   Tobacco Use  . Smoking status: Former Smoker    Packs/day: 1.00    Years: 20.00    Pack years: 20.00    Types: Cigarettes    Last attempt to quit: 03/28/1980    Years since quitting: 38.5  . Smokeless tobacco: Never Used  Substance Use Topics  . Alcohol use: Yes    Alcohol/week: 28.0 standard drinks     Types: 28 Cans of beer per week    Comment: 4 beers a night      Objective:   BP 124/70 (BP Location: Left Arm, Patient Position: Sitting, Cuff Size: Large)   Pulse 72   Temp 98.3 F (36.8 C)   Resp 16   Ht 6\' 1"  (1.854 m)   Wt 203 lb (92.1 kg)   SpO2 99%   BMI 26.78 kg/m  Vitals:   09/22/18 0915  BP: 124/70  Pulse: 72  Resp: 16  Temp: 98.3 F (36.8 C)  SpO2: 99%  Weight: 203 lb (92.1 kg)  Height: 6\' 1"  (1.854 m)     Physical Exam Vitals signs reviewed.  Constitutional:      Appearance: Normal appearance.  HENT:     Head: Normocephalic and atraumatic.     Right Ear: External ear normal.     Left Ear: External ear normal.  Eyes:     General: No scleral icterus. Cardiovascular:     Rate and Rhythm: Normal rate and regular rhythm.     Heart sounds: Normal heart sounds.  Pulmonary:     Effort: Pulmonary effort is normal.     Breath sounds: Normal breath sounds.  Musculoskeletal:     Comments: Mildly decreased range of motion the right shoulder with abduction.  Lymphadenopathy:     Cervical: No cervical adenopathy.  Skin:    General: Skin is warm and dry.  Neurological:     Mental Status: He is alert and oriented to person, place, and time.     Comments: He seems to have some mild decrease strength in the right arm with triceps strength.  Grip  is good and biceps good  Psychiatric:        Mood and Affect: Mood normal.        Behavior: Behavior normal.        Thought Content: Thought content normal.        Judgment: Judgment normal.         Assessment & Plan    1. Cervical radiculopathy Patient is having no pain.  Have advised him if the weakness continues to get input touch with his orthopedic doctor who has a spine surgeon in his practice.  Continue physical therapy exercises.  2. Arthropathy of right shoulder I think he is also having a shoulder problem.  May need to see orthopedics about this.  Again, he is not having pain.  3. Coronary artery  disease involving coronary bypass graft of native heart without angina pectoris All risk factors treated.  4. OSA (obstructive sleep apnea) Patient wearing CPAP.  5. S/P CABG x 2   6. Macular degeneration of  both eyes, unspecified type Patient not driving as he is functionally blind because of this. 7.Mild Bronchitis Mucinex twice daily and increase fluids.  No signs or symptoms of infection at this time.   I have done the exam and reviewed the above chart and it is accurate to the best of my knowledge. Dentist has been used in this note in any air is in the dictation or transcription are unintentional.  Megan Mans, MD  [facility]

## 2018-10-02 DIAGNOSIS — M5136 Other intervertebral disc degeneration, lumbar region: Secondary | ICD-10-CM | POA: Diagnosis not present

## 2018-10-02 DIAGNOSIS — M4856XA Collapsed vertebra, not elsewhere classified, lumbar region, initial encounter for fracture: Secondary | ICD-10-CM | POA: Diagnosis not present

## 2018-10-02 DIAGNOSIS — M545 Low back pain: Secondary | ICD-10-CM | POA: Diagnosis not present

## 2018-10-23 DIAGNOSIS — M545 Low back pain: Secondary | ICD-10-CM | POA: Diagnosis not present

## 2018-10-23 DIAGNOSIS — M4856XA Collapsed vertebra, not elsewhere classified, lumbar region, initial encounter for fracture: Secondary | ICD-10-CM | POA: Diagnosis not present

## 2018-10-23 DIAGNOSIS — S22000D Wedge compression fracture of unspecified thoracic vertebra, subsequent encounter for fracture with routine healing: Secondary | ICD-10-CM | POA: Diagnosis not present

## 2018-11-03 DIAGNOSIS — M5030 Other cervical disc degeneration, unspecified cervical region: Secondary | ICD-10-CM | POA: Diagnosis not present

## 2018-11-03 DIAGNOSIS — S32010A Wedge compression fracture of first lumbar vertebra, initial encounter for closed fracture: Secondary | ICD-10-CM | POA: Diagnosis not present

## 2018-11-03 DIAGNOSIS — M4712 Other spondylosis with myelopathy, cervical region: Secondary | ICD-10-CM | POA: Diagnosis not present

## 2018-11-03 DIAGNOSIS — M4722 Other spondylosis with radiculopathy, cervical region: Secondary | ICD-10-CM | POA: Diagnosis not present

## 2018-11-03 DIAGNOSIS — S22080A Wedge compression fracture of T11-T12 vertebra, initial encounter for closed fracture: Secondary | ICD-10-CM | POA: Diagnosis not present

## 2018-11-03 DIAGNOSIS — S22060A Wedge compression fracture of T7-T8 vertebra, initial encounter for closed fracture: Secondary | ICD-10-CM | POA: Diagnosis not present

## 2018-11-03 DIAGNOSIS — G959 Disease of spinal cord, unspecified: Secondary | ICD-10-CM | POA: Diagnosis not present

## 2018-11-03 DIAGNOSIS — S32030A Wedge compression fracture of third lumbar vertebra, initial encounter for closed fracture: Secondary | ICD-10-CM | POA: Diagnosis not present

## 2018-11-03 DIAGNOSIS — M549 Dorsalgia, unspecified: Secondary | ICD-10-CM | POA: Diagnosis not present

## 2018-11-05 DIAGNOSIS — M5023 Other cervical disc displacement, cervicothoracic region: Secondary | ICD-10-CM | POA: Diagnosis not present

## 2018-11-05 DIAGNOSIS — M4313 Spondylolisthesis, cervicothoracic region: Secondary | ICD-10-CM | POA: Diagnosis not present

## 2018-11-05 DIAGNOSIS — M5031 Other cervical disc degeneration,  high cervical region: Secondary | ICD-10-CM | POA: Diagnosis not present

## 2018-11-05 DIAGNOSIS — M4712 Other spondylosis with myelopathy, cervical region: Secondary | ICD-10-CM | POA: Diagnosis not present

## 2018-11-05 DIAGNOSIS — M4802 Spinal stenosis, cervical region: Secondary | ICD-10-CM | POA: Diagnosis not present

## 2018-11-07 DIAGNOSIS — S22060A Wedge compression fracture of T7-T8 vertebra, initial encounter for closed fracture: Secondary | ICD-10-CM | POA: Diagnosis not present

## 2018-11-07 DIAGNOSIS — R2689 Other abnormalities of gait and mobility: Secondary | ICD-10-CM | POA: Diagnosis not present

## 2018-11-07 DIAGNOSIS — M5030 Other cervical disc degeneration, unspecified cervical region: Secondary | ICD-10-CM | POA: Diagnosis not present

## 2018-11-07 DIAGNOSIS — G8250 Quadriplegia, unspecified: Secondary | ICD-10-CM | POA: Diagnosis not present

## 2018-11-07 DIAGNOSIS — S32030A Wedge compression fracture of third lumbar vertebra, initial encounter for closed fracture: Secondary | ICD-10-CM | POA: Diagnosis not present

## 2018-11-07 DIAGNOSIS — S22080A Wedge compression fracture of T11-T12 vertebra, initial encounter for closed fracture: Secondary | ICD-10-CM | POA: Diagnosis not present

## 2018-11-07 DIAGNOSIS — M47812 Spondylosis without myelopathy or radiculopathy, cervical region: Secondary | ICD-10-CM | POA: Diagnosis not present

## 2018-11-07 DIAGNOSIS — S32010A Wedge compression fracture of first lumbar vertebra, initial encounter for closed fracture: Secondary | ICD-10-CM | POA: Diagnosis not present

## 2018-11-07 DIAGNOSIS — G959 Disease of spinal cord, unspecified: Secondary | ICD-10-CM | POA: Diagnosis not present

## 2018-11-11 ENCOUNTER — Other Ambulatory Visit: Payer: Self-pay | Admitting: Neurosurgery

## 2018-11-11 DIAGNOSIS — S32030A Wedge compression fracture of third lumbar vertebra, initial encounter for closed fracture: Secondary | ICD-10-CM

## 2018-11-17 DIAGNOSIS — M25511 Pain in right shoulder: Secondary | ICD-10-CM | POA: Diagnosis not present

## 2018-11-17 DIAGNOSIS — M4856XA Collapsed vertebra, not elsewhere classified, lumbar region, initial encounter for fracture: Secondary | ICD-10-CM | POA: Diagnosis not present

## 2018-11-17 DIAGNOSIS — S22000D Wedge compression fracture of unspecified thoracic vertebra, subsequent encounter for fracture with routine healing: Secondary | ICD-10-CM | POA: Diagnosis not present

## 2018-12-19 DIAGNOSIS — S22080A Wedge compression fracture of T11-T12 vertebra, initial encounter for closed fracture: Secondary | ICD-10-CM | POA: Diagnosis not present

## 2018-12-19 DIAGNOSIS — S32010A Wedge compression fracture of first lumbar vertebra, initial encounter for closed fracture: Secondary | ICD-10-CM | POA: Diagnosis not present

## 2018-12-19 DIAGNOSIS — S32030A Wedge compression fracture of third lumbar vertebra, initial encounter for closed fracture: Secondary | ICD-10-CM | POA: Diagnosis not present

## 2018-12-19 DIAGNOSIS — S22060A Wedge compression fracture of T7-T8 vertebra, initial encounter for closed fracture: Secondary | ICD-10-CM | POA: Diagnosis not present

## 2018-12-25 ENCOUNTER — Other Ambulatory Visit: Payer: Self-pay

## 2018-12-25 ENCOUNTER — Telehealth: Payer: Self-pay | Admitting: Neurology

## 2018-12-25 ENCOUNTER — Ambulatory Visit (INDEPENDENT_AMBULATORY_CARE_PROVIDER_SITE_OTHER): Payer: Medicare Other | Admitting: Neurology

## 2018-12-25 ENCOUNTER — Encounter: Payer: Self-pay | Admitting: Neurology

## 2018-12-25 VITALS — BP 126/70 | HR 102 | Temp 96.8°F | Ht 73.0 in | Wt 202.0 lb

## 2018-12-25 DIAGNOSIS — R5382 Chronic fatigue, unspecified: Secondary | ICD-10-CM

## 2018-12-25 DIAGNOSIS — I25810 Atherosclerosis of coronary artery bypass graft(s) without angina pectoris: Secondary | ICD-10-CM

## 2018-12-25 DIAGNOSIS — R269 Unspecified abnormalities of gait and mobility: Secondary | ICD-10-CM | POA: Diagnosis not present

## 2018-12-25 DIAGNOSIS — G8250 Quadriplegia, unspecified: Secondary | ICD-10-CM | POA: Diagnosis not present

## 2018-12-25 DIAGNOSIS — G3281 Cerebellar ataxia in diseases classified elsewhere: Secondary | ICD-10-CM | POA: Diagnosis not present

## 2018-12-25 DIAGNOSIS — E538 Deficiency of other specified B group vitamins: Secondary | ICD-10-CM | POA: Diagnosis not present

## 2018-12-25 HISTORY — DX: Unspecified abnormalities of gait and mobility: R26.9

## 2018-12-25 NOTE — Telephone Encounter (Signed)
medicare/[employer] order sent to [facility] . No auth they will reach out to the patient to schedule.

## 2018-12-25 NOTE — Progress Notes (Signed)
Reason for visit: Gait disturbance  Referring physician: [name] is a 73 y.o. male  History of present illness:  Jason Tate is a 73 year old right-handed white male with a history of some alteration in his arm and leg strength and walking since December 2019.  The patient began initially noting some problems with holding his right arm up above his head, and some weakness of the right hand.  By January 2020, he started having problems with falling.  He has sustained several compression fractures in the lumbar spine and he has some back pain associated with this.  He has fallen at least 6 times since January 2020.  The last fall was several weeks ago.  The patient has had several falls where he believes that his right knee will collapse and he will go down.  He is now using a cane for ambulation.  He reports no numbness of the extremities, he denies issues controlling the bowels or the bladder.  He has not had any change in speech or swallowing.  He was seen by Dr. Sherwood Gambler, MRI of the cervical spine was done and did show some spondylitic changes but no abnormalities in the spinal cord was noted.  The patient was noted to be hyperreflexic, particularly in the lower extremities.  He is sent to this office for further evaluation.  Past Medical History:  Diagnosis Date  . Abnormal stress echo Oct 2012  . Arthritis   . CAD (coronary artery disease) October 2012   s/p CABG x 2 per Dr. Roxy Manns  . Hypercholesterolemia   . Low testosterone   . Macular degeneration   . 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 Obesity   . PVC (premature ventricular contraction)   . Sleep apnea, obstructive    on BiPAP    Past Surgical History:  Procedure Laterality Date  . COLONOSCOPY    . CORONARY ARTERY BYPASS GRAFT  Oct 2012   LIMA to LAD and left radial to OM  . HEMORRHOIDECTOMY WITH HEMORRHOID BANDING    . INJECTION KNEE      Family History  Problem  Relation Age of Onset  . Heart disease Mother   . Stroke Father   . Heart disease Father   . Dementia Father     Social history:  reports that he quit smoking about 38 years ago. His smoking use included cigarettes. He has a 20.00 pack-year smoking history. He has never used smokeless tobacco. He reports current alcohol use of about 28.0 standard drinks of alcohol per week. He reports that he does not use drugs.  Medications:  Prior to Admission medications   Medication Sig Start Date End Date Taking? Authorizing Provider  allopurinol (ZYLOPRIM) 100 MG tablet Take 1 tablet (100 mg total) by mouth daily. 07/23/18  Yes Jerrol Banana., MD  aspirin 81 MG tablet Take 81 mg by mouth daily.   Yes [provider]  B Complex Vitamins (VITAMIN B COMPLEX PO) Take 1 tablet by mouth daily.    Yes [provider]  buPROPion (WELLBUTRIN XL) 300 MG 24 hr tablet Take 1 tablet (300 mg total) by mouth daily. 07/23/18  Yes Jerrol Banana., MD  Cetirizine HCl (ZYRTEC ALLERGY PO) Take 1 tablet by mouth daily as needed (allergies).    Yes [provider]  Cholecalciferol (VITAMIN D PO) Take 4,000 mg by mouth daily.     Yes [provider]  citalopram (  CELEXA) 20 MG tablet Take 1 tablet (20 mg total) by mouth daily. 07/23/18  Yes Maple HudsonGilbert, Richard L Jr., MD  clonazePAM (KLONOPIN) 1 MG tablet Take 1 tablet (1 mg total) by mouth at bedtime as needed. for sleep 07/23/18  Yes Maple HudsonGilbert, Richard L Jr., MD  fluticasone [facility](FLONASE) 50 MCG/ACT nasal spray Place 1 spray into both nostrils daily as needed for allergies. 09/11/17  Yes Maple HudsonGilbert, Richard L Jr., MD  FLUZONE HIGH-DOSE 0.5 ML injection TO BE ADMINISTERED BY PHARMACIST FOR IMMUNIZATION 03/04/17  Yes [provider]  Misc Natural Products (OSTEO BI-FLEX JOINT SHIELD PO) Take 1 tablet by mouth daily.    Yes [provider]  Multiple Vitamins-Minerals (PRESERVISION/LUTEIN PO) Take 1 tablet by mouth daily.    Yes  [provider]  nabumetone (RELAFEN) 500 MG tablet Take 1 tablet (500 mg total) by mouth 2 (two) times daily as needed. 07/23/18  Yes Maple HudsonGilbert, Richard L Jr., MD  Omega-3 Fatty Acids (FISH OIL PO) Take 1 tablet by mouth daily.    Yes [provider]  omeprazole (PRILOSEC) 20 MG capsule Take 1 capsule (20 mg total) by mouth daily. 07/23/18  Yes Maple HudsonGilbert, Richard L Jr., MD  sildenafil (REVATIO) 20 MG tablet 1 - 5 PO daily as needed 06/06/17  Yes Maple HudsonGilbert, Richard L Jr., MD  simvastatin (ZOCOR) 20 MG tablet Take 1 tablet (20 mg total) by mouth at bedtime. 07/23/18  Yes Maple HudsonGilbert, Richard L Jr., MD     No Known Allergies  ROS:  Out of a complete 14 system review of symptoms, the patient complains only of the following symptoms, and all other reviewed systems are negative.  Weakness Walking problems Low back pain  Blood pressure 126/70, pulse (!) 102, temperature (!) 96.8 F (36 C), height 6\' 1"  (1.854 m), weight 202 lb (91.6 kg).  Physical Exam  General: The patient is alert and cooperative at the time of the examination.  Eyes: Pupils are equal, round, and reactive to light. Discs are flat bilaterally.  Neck: The neck is supple, no carotid bruits are noted.  Respiratory: The respiratory examination is clear.  Cardiovascular: The cardiovascular examination reveals a regular rate and rhythm, no obvious murmurs or rubs are noted.  Skin: Extremities are without significant edema.  Neurologic Exam  Mental status: The patient is alert and oriented x 3 at the time of the examination. The patient has apparent normal recent and remote memory, with an apparently normal attention span and concentration ability.  Cranial nerves: Facial symmetry is present. There is good sensation of the face to pinprick and soft touch bilaterally. The strength of the facial muscles and the muscles to head turning and shoulder shrug are normal bilaterally. Speech is well enunciated, no aphasia or  dysarthria is noted. Extraocular movements are full. Visual fields are full. The tongue is midline, and the patient has symmetric elevation of the soft palate. No obvious hearing deficits are noted.  Motor: The motor testing reveals 4/5 strength with intrinsic muscles of the hands bilaterally, the patient has 4-/5 strength in the biceps and triceps muscles on the right, 4/5 strength on the left and 3/5 strength with the right deltoid, 4/5 with the left deltoid.  The patient has 4/5 strength with iliopsoas muscles bilaterally, good strength with flexion and extension at the knees bilaterally, good abductor strength bilaterally, and bilateral foot drops are noted.  Sensory: Sensory testing is intact to pinprick, soft touch, vibration sensation, and position sense on all 4 extremities, with  exception of some decrease in vibration sensation in both feet. No evidence of extinction is noted.  Coordination: Cerebellar testing reveals good finger-nose-finger and heel-to-shin bilaterally.  Gait and station: Gait is slightly wide-based, the patient normally uses a cane for ambulation.  Tandem gait is very unsteady.  Romberg is negative.  The patient is unable to arise from a seated position with arms crossed.  Reflexes: Deep tendon reflexes notable for slight elevation on the left biceps compared to the right, symmetric with the triceps.  The knee jerk reflexes are brisk bilaterally, the right ankle jerk reflex is brisk more so than the left.  3-4 beats of ankle clonus are noted on the right, 2-3 beats on the left.  Toes are downgoing bilaterally.   Assessment/Plan:  1.  Quadriparesis  2.  Hyperreflexia  3.  Gait disorder  The patient has had a progressive problem with weakness and spasticity on all 4 extremities.  The symptoms began with weakness primarily affecting the right arm.  MRI of the cervical spine has not shown evidence of a cervical myelopathy.  For this reason, the highest concern would be  for an anterior horn cell disease process such as ALS.  The patient will undergo blood work today, he will have MRI of the brain to exclude a brainstem stroke or a multi-infarct state.  If this is unremarkable, EMG and nerve conduction study will be done.  He will follow-up otherwise in 4 months.  Jason Tate. Keith Willis MD 12/25/2018 3:17 PM  [facility] [address] [place], [place] [id number]  Phone [phone] Fax [phone]

## 2018-12-30 LAB — CBC WITH DIFFERENTIAL/PLATELET
Hematocrit: 36.4 % — ABNORMAL LOW (ref 37.5–51.0)
Hemoglobin: 12.3 g/dL — ABNORMAL LOW (ref 13.0–17.7)
MCH: 33.7 pg — ABNORMAL HIGH (ref 26.6–33.0)
MCHC: 33.8 g/dL (ref 31.5–35.7)
MCV: 100 fL — ABNORMAL HIGH (ref 79–97)
Platelets: 185 10*3/uL (ref 150–450)
RBC: 3.65 x10E6/uL — ABNORMAL LOW (ref 4.14–5.80)
RDW: 12.7 % (ref 11.6–15.4)
WBC: 7.7 10*3/uL (ref 3.4–10.8)

## 2019-01-01 ENCOUNTER — Telehealth: Payer: Self-pay | Admitting: Neurology

## 2019-01-01 DIAGNOSIS — R531 Weakness: Secondary | ICD-10-CM

## 2019-01-01 NOTE — Telephone Encounter (Signed)
I called the patient.  The blood work shows a low copper level, he is to get on a multivitamin that has a least 2 mg of copper.  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  For this reason, I will go ahead and get EMG nerve conduction study now, MRI of the cervical spine is pending.

## 2019-01-02 LAB — SPECIMEN STATUS REPORT

## 2019-01-06 ENCOUNTER — Telehealth: Payer: Self-pay | Admitting: Neurology

## 2019-01-06 LAB — SPECIMEN STATUS REPORT

## 2019-01-06 NOTE — Telephone Encounter (Signed)
I called the patient.  The patient is getting 3 mg of copper daily, this should be enough, the patient does not take extra zinc supplementation.

## 2019-01-06 NOTE — Telephone Encounter (Signed)
I called patient today to schedule his NCV/EMG. Patient stated that he has a question regarding medication; he states Dr. Jannifer Franklin advised him to take 2 mg of copper vitamin daily. Pt states that he is taking preservision, which has 1 mg of copper, but he takes 3 daily. Patient is wondering if he should increase this by 2 mg through a vitamin or keep the same amount using preservision?

## 2019-01-07 ENCOUNTER — Telehealth: Payer: Self-pay | Admitting: Neurology

## 2019-01-07 LAB — COMPREHENSIVE METABOLIC PANEL WITH GFR
ALT: 26 IU/L (ref 0–44)
AST: 30 IU/L (ref 0–40)
Albumin/Globulin Ratio: 2.5 — ABNORMAL HIGH (ref 1.2–2.2)
Albumin: 4.7 g/dL (ref 3.7–4.7)
Alkaline Phosphatase: 63 IU/L (ref 39–117)
BUN/Creatinine Ratio: 11 (ref 10–24)
BUN: 12 mg/dL (ref 8–27)
Bilirubin Total: 0.6 mg/dL (ref 0.0–1.2)
CO2: 23 mmol/L (ref 20–29)
Calcium: 9.5 mg/dL (ref 8.6–10.2)
Chloride: 103 mmol/L (ref 96–106)
Creatinine, Ser: 1.05 mg/dL (ref 0.76–1.27)
GFR calc Af Amer: 80 mL/min/1.73
GFR calc non Af Amer: 70 mL/min/1.73
Globulin, Total: 1.9 g/dL (ref 1.5–4.5)
Glucose: 104 mg/dL — ABNORMAL HIGH (ref 65–99)
Potassium: 4.1 mmol/L (ref 3.5–5.2)
Sodium: 140 mmol/L (ref 134–144)
Total Protein: 6.6 g/dL (ref 6.0–8.5)

## 2019-01-07 LAB — HTLV I+II ANTIBODIES, (EIA), BLD: HTLV I/II Ab: NEGATIVE

## 2019-01-07 LAB — B. BURGDORFI ANTIBODIES: Lyme IgG/IgM Ab: <0.91 {ISR} (ref 0.00–0.90)

## 2019-01-07 LAB — VGCC ANTIBODY: VGCC Antibody: NEGATIVE

## 2019-01-07 LAB — ACETYLCHOLINE RECEPTOR, BINDING: AChR Binding Ab, Serum: <0.03 nmol/L (ref 0.00–0.24)

## 2019-01-07 LAB — VITAMIN B12: Vitamin B-12: 1234 pg/mL (ref 232–1245)

## 2019-01-07 LAB — RPR: RPR Ser Ql: NONREACTIVE

## 2019-01-07 LAB — CK: Total CK: 465 U/L — ABNORMAL HIGH (ref 41–331)

## 2019-01-07 LAB — TSH: TSH: 1.16 u[IU]/mL (ref 0.450–4.500)

## 2019-01-07 LAB — ANA W/REFLEX: Anti Nuclear Antibody (ANA): NEGATIVE

## 2019-01-07 LAB — COPPER, SERUM: Copper: 59 ug/dL — ABNORMAL LOW (ref 72–166)

## 2019-01-07 LAB — ANGIOTENSIN CONVERTING ENZYME: Angio Convert Enzyme: 58 U/L (ref 14–82)

## 2019-01-07 LAB — SEDIMENTATION RATE: Sed Rate: 3 mm/hr (ref 0–30)

## 2019-01-07 NOTE — Telephone Encounter (Signed)
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  The rest the blood work has come back in, it is negative.  I will discuss this with the patient later.

## 2019-01-08 DIAGNOSIS — M25561 Pain in right knee: Secondary | ICD-10-CM | POA: Diagnosis not present

## 2019-01-08 DIAGNOSIS — M170 Bilateral primary osteoarthritis of knee: Secondary | ICD-10-CM | POA: Diagnosis not present

## 2019-01-08 DIAGNOSIS — M25562 Pain in left knee: Secondary | ICD-10-CM | POA: Diagnosis not present

## 2019-01-14 ENCOUNTER — Ambulatory Visit: Payer: Self-pay | Admitting: Family Medicine

## 2019-01-15 DIAGNOSIS — M1711 Unilateral primary osteoarthritis, right knee: Secondary | ICD-10-CM | POA: Diagnosis not present

## 2019-01-15 DIAGNOSIS — M1712 Unilateral primary osteoarthritis, left knee: Secondary | ICD-10-CM | POA: Diagnosis not present

## 2019-01-19 ENCOUNTER — Ambulatory Visit (INDEPENDENT_AMBULATORY_CARE_PROVIDER_SITE_OTHER): Payer: Medicare Other | Admitting: Neurology

## 2019-01-19 ENCOUNTER — Encounter: Payer: Self-pay | Admitting: Neurology

## 2019-01-19 ENCOUNTER — Other Ambulatory Visit: Payer: Self-pay

## 2019-01-19 DIAGNOSIS — G1221 Amyotrophic lateral sclerosis: Secondary | ICD-10-CM

## 2019-01-19 DIAGNOSIS — R531 Weakness: Secondary | ICD-10-CM

## 2019-01-19 HISTORY — DX: Amyotrophic lateral sclerosis: G12.21

## 2019-01-19 NOTE — Progress Notes (Signed)
Please refer to EMG and nerve conduction procedure note.  

## 2019-01-19 NOTE — Procedures (Signed)
HISTORY:  Jason Tate is a 73 year old gentleman with a history of a progressive quadriparesis and difficulty with gait instability with frequent falls.  He has been noted to have hyperreflexia and muscle wasting of the arms and legs.  The patient is being evaluated for this issue.  NERVE CONDUCTION STUDIES:  Nerve conduction studies were performed on the right upper extremity.  The distal motor latency for the right median nerve is slightly prolonged with a low motor amplitude.  The distal motor latency for the right ulnar nerve is normal with a slightly low motor amplitude.  The nerve conduction velocities for the right median and ulnar nerves were normal.  The right median sensory latency is slightly prolonged, normal for the right ulnar nerve.  The right ulnar F-wave latency was normal.  Nerve conductions were performed on both lower extremities.  The distal motor latencies for the peroneal and posterior tibial nerves were normal bilaterally with a low motor amplitude for the right peroneal nerve, normal for the left peroneal nerve and for the posterior tibial nerves bilaterally.  Slowing was seen for the peroneal nerves bilaterally and for the right posterior tibial nerve, normal for the left posterior tibial nerve.  No response was seen for the right sural nerve with prolongation of the latency for the left sural nerve.  No response was seen for the right peroneal nerve, the left peroneal nerve sensory latency was normal.  The F-wave latencies for the posterior tibial nerves were slightly prolonged bilaterally.  EMG STUDIES:  EMG study was performed on the right upper extremity:  The first dorsal interosseous muscle reveals up to 6-7 K units with moderately reduced recruitment.  3+ fibrillations and positive waves were noted. The abductor pollicis brevis muscle reveals up to 6 K units with significantly reduced recruitment.  1+ fibrillations and positive waves were noted.  1+  fasciculations were seen. The extensor indicis proprius muscle reveals 2-5 K units with moderately reduced recruitment.  1+ fibrillations and positive waves were noted. The biceps muscle reveals up to 5 K units with moderately reduced recruitment.  2+ fibrillations and positive waves were noted. The triceps muscle reveals up to 6 K units with moderately reduced recruitment.  1+ fibrillations and positive waves were noted. The anterior deltoid muscle reveals up to 4 K units with markedly reduced recruitment. No fibrillations or positive waves were noted. The cervical paraspinal muscles were tested at 2 levels.  1+ fibrillations and positive waves were seen in the lower level 2+ fibrillations and positive waves seen at the upper level.  There was good relaxation.  EMG study was performed on the right lower extremity:  The tibialis anterior muscle reveals 2-6 K motor units with moderately reduced recruitment.  2+ fibrillations and positive waves were seen.  2+ fasciculations were seen. The peroneus tertius muscle reveals up to 6 K motor units with moderately reduced recruitment.  2+ fibrillations and positive waves were seen. The medial gastrocnemius muscle reveals 2-5 K motor units with decreased recruitment.  2+ fibrillations and positive waves were seen. The vastus lateralis muscle reveals 2-6 K motor units with decreased recruitment.  2+ fibrillations and positive waves were seen. The iliopsoas muscle reveals 2-5 K motor units with decreased recruitment.  2+ fibrillations and positive waves were seen. The biceps femoris muscle (long head) reveals 2 to 4K motor units with slightly decreased recruitment. No fibrillations or positive waves were seen. The lumbosacral paraspinal muscles were tested at 3 levels, and revealed no  abnormalities of insertional activity at all 3 levels tested. There was fair relaxation.   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  EMG evaluation of the right upper extremity and right lower extremity shows diffuse proximal and distal chronic and acute signs of denervation.  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  Jason Tate. Jason Raysha Tilmon MD 01/19/2019 4:28 PM  [facility] [address] [place], [place] [id number]  Phone [phone] Fax [phone]

## 2019-01-20 ENCOUNTER — Telehealth: Payer: Self-pay

## 2019-01-20 NOTE — Telephone Encounter (Signed)
I spoke to pt's wife, per DPR, while she was in the office. Pt has been scheduled for tomorrow at 7:30am with Dr. Jannifer Franklin.

## 2019-01-20 NOTE — Telephone Encounter (Signed)
-----   Message from Kathrynn Ducking, MD sent at 01/19/2019  4:49 PM EDT ----- This patient will need a revisit sometime in the next several weeks to discuss treatment.

## 2019-01-21 ENCOUNTER — Other Ambulatory Visit: Payer: Self-pay

## 2019-01-21 ENCOUNTER — Encounter: Payer: Self-pay | Admitting: Neurology

## 2019-01-21 ENCOUNTER — Ambulatory Visit (INDEPENDENT_AMBULATORY_CARE_PROVIDER_SITE_OTHER): Payer: Medicare Other | Admitting: Neurology

## 2019-01-21 ENCOUNTER — Telehealth: Payer: Self-pay | Admitting: *Deleted

## 2019-01-21 VITALS — BP 140/85 | HR 96 | Ht 73.0 in | Wt 195.0 lb

## 2019-01-21 DIAGNOSIS — I25810 Atherosclerosis of coronary artery bypass graft(s) without angina pectoris: Secondary | ICD-10-CM | POA: Diagnosis not present

## 2019-01-21 DIAGNOSIS — G1221 Amyotrophic lateral sclerosis: Secondary | ICD-10-CM

## 2019-01-21 MED ORDER — RADICAVA 30 MG/100ML IV SOLN: 60.0000 mg | Freq: Every day | INTRAVENOUS | 14 refills | Status: AC

## 2019-01-21 NOTE — Progress Notes (Signed)
Reason for visit: ALS  Jason Tate is an 73 y.o. male  History of present illness:  Jason Tate is a 73 year old right-handed white male with a history of a progressive quadriparesis associated with spasticity.  The patient has had EMG and nerve conduction study that revealed evidence of ALS.  The patient returns today for discussion of treatment options.  The patient indicates that he lives in a home where he has a full flight of stairs to get to the first floor and then another flight of stairs to get to his bedroom.  They are considering moving to another home or to go to an extended care facility.  The patient is not having any difficulty with swallowing or choking.  He does have weakness of the arms, right greater than left and some weakness in both legs, he will fall on occasion.  He is using a cane for ambulation.  He returns for further evaluation.  He does report occasional mild muscle cramps in the legs.  Past Medical History:  Diagnosis Date  . Abnormal stress echo Oct 2012  . 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 Arthritis   . CAD (coronary artery disease) October 2012   s/p CABG x 2 per Dr. Cornelius Moras  . Gait abnormality 12/25/2018  . Hypercholesterolemia   . Low testosterone   . Macular degeneration   . 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 Obesity   . PVC (premature ventricular contraction)   . Sleep apnea, obstructive    on BiPAP    Past Surgical History:  Procedure Laterality Date  . COLONOSCOPY    . CORONARY ARTERY BYPASS GRAFT  Oct 2012   LIMA to LAD and left radial to OM  . HEMORRHOIDECTOMY WITH HEMORRHOID BANDING    . INJECTION KNEE      Family History  Problem Relation Age of Onset  . Heart disease Mother   . Stroke Father   . Heart disease Father   . Dementia Father     Social history:  reports that he quit smoking about 38 years ago. His smoking use included cigarettes. He has a 20.00 pack-year smoking  history. He has never used smokeless tobacco. He reports current alcohol use of about 28.0 standard drinks of alcohol per week. He reports that he does not use drugs.   No Known Allergies  Medications:  Prior to Admission medications   Medication Sig Start Date End Date Taking? Authorizing Provider  allopurinol (ZYLOPRIM) 100 MG tablet Take 1 tablet (100 mg total) by mouth daily. 07/23/18  Yes [name]., MD  aspirin 81 MG tablet Take 81 mg by mouth daily.   Yes [provider]  B Complex Vitamins (VITAMIN B COMPLEX PO) Take 1 tablet by mouth daily.    Yes [provider]  buPROPion (WELLBUTRIN XL) 300 MG 24 hr tablet Take 1 tablet (300 mg total) by mouth daily. 07/23/18  Yes [name]., MD  Cetirizine HCl (ZYRTEC ALLERGY PO) Take 1 tablet by mouth daily as needed (allergies).    Yes [provider]  Cholecalciferol (VITAMIN D PO) Take 4,000 mg by mouth daily.     Yes [provider]  citalopram (CELEXA) 20 MG tablet Take 1 tablet (20 mg total) by mouth daily. 07/23/18  Yes [name]., MD  clonazePAM (KLONOPIN) 1 MG tablet Take 1 tablet (1 mg total) by mouth at bedtime as needed. for sleep 07/23/18  Yes Maple HudsonGilbert, Richard L Jr., MD  fluticasone [facility](FLONASE) 50 MCG/ACT nasal spray Place 1 spray into both nostrils daily as needed for allergies. 09/11/17  Yes Maple HudsonGilbert, Richard L Jr., MD  FLUZONE HIGH-DOSE 0.5 ML injection TO BE ADMINISTERED BY PHARMACIST FOR IMMUNIZATION 03/04/17  Yes [provider]  Misc Natural Products (OSTEO BI-FLEX JOINT SHIELD PO) Take 1 tablet by mouth daily.    Yes [provider]  Multiple Vitamins-Minerals (PRESERVISION/LUTEIN PO) Take 1 tablet by mouth daily.    Yes [provider]  nabumetone (RELAFEN) 500 MG tablet Take 1 tablet (500 mg total) by mouth 2 (two) times daily as needed. 07/23/18  Yes Maple HudsonGilbert, Richard L Jr., MD  Omega-3 Fatty Acids (FISH OIL PO) Take 1 tablet by mouth  daily.    Yes [provider]  omeprazole (PRILOSEC) 20 MG capsule Take 1 capsule (20 mg total) by mouth daily. 07/23/18  Yes Maple HudsonGilbert, Richard L Jr., MD  sildenafil (REVATIO) 20 MG tablet 1 - 5 PO daily as needed 06/06/17  Yes Maple HudsonGilbert, Richard L Jr., MD  simvastatin (ZOCOR) 20 MG tablet Take 1 tablet (20 mg total) by mouth at bedtime. 07/23/18  Yes Maple HudsonGilbert, Richard L Jr., MD    ROS:  Out of a complete 14 system review of symptoms, the patient complains only of the following symptoms, and all other reviewed systems are negative.  Weakness Walking problems  Blood pressure 140/85, pulse 96, height 6\' 1"  (1.854 m), weight 195 lb (88.5 kg).  Physical Exam  General: The patient is alert and cooperative at the time of the examination.  Skin: No significant peripheral edema is noted.   Neurologic Exam  Mental status: The patient is alert and oriented x 3 at the time of the examination. The patient has apparent normal recent and remote memory, with an apparently normal attention span and concentration ability.   Cranial nerves: Facial symmetry is present. Speech is normal, no aphasia or dysarthria is noted. Extraocular movements are full. Visual fields are full.  Motor: The patient has weakness of intrinsic muscles of the hands bilaterally, right greater than left.  The patient has biceps and triceps weakness bilaterally and deltoid weakness bilaterally again worse on the right.  With the lower extremities, there is diffuse 4+/5 strength in the proximal muscles of the thighs, symmetric from one side to the next.  He has bilateral foot drops.  Sensory examination: Soft touch sensation is symmetric on the face, arms, and legs.  Coordination: The patient has good finger-nose-finger and heel-to-shin bilaterally.  Gait and station: The patient has a slightly wide-based gait, he walks with a cane.  Reflexes: Deep tendon reflexes are symmetric, the reflexes are more normal in the arms but  increased in the legs, bilateral ankle clonus is seen.   Assessment/Plan:  1.  ALS  2.  Gait disorder  We discussed potential treatment such as Rilutek which would offer very minimal benefit.  We also discussed IV medication such as Radicava, the patient may be interested in this treatment option.  I [facility] to see Dr. Zannie KehrBedlack.  The discussion of end-of-life issues was initiated, the patient will need to move to a more wheelchair accessible living environment.  Safety is a major consideration at this time with his gait instability.  He will follow-up here in 3 months.  The patient appears to be interested in the treatment with Radicava.  If he tolerates the medication for the first 14 days, we  may consider a Port-A-Cath placement at that time.  Greater than 50% of the visit was spent in counseling and coordination of care.  Face-to-face time with the patient was 25 minutes.   Jill Alexanders MD 01/21/2019 7:33 AM  [facility] [address] [place], [place] [address]  Phone [phone] Fax [phone]

## 2019-01-21 NOTE — Progress Notes (Signed)
[place]    Nerve / Sites Muscle Latency Ref. Amplitude Ref. Rel Amp Segments Distance Velocity Ref. Area    ms ms mV mV %  cm m/s m/s mVms  R Median - APB     Wrist APB 4.2 ?4.4 2.7 ?4.0 100 Wrist - APB 7   7.9     Upper arm APB 9.1  2.5  94.6 Upper arm - Wrist 25 51 ?49 7.4  R Ulnar - ADM     Wrist ADM 2.2 ?3.3 4.8 ?6.0 100 Wrist - ADM 7   16.3     B.Elbow ADM 6.5  3.9  80.9 B.Elbow - Wrist 22 52 ?49 18.2     A.Elbow ADM 8.5  3.6  91.7 A.Elbow - B.Elbow 10 49 ?49 13.6         A.Elbow - Wrist      R Peroneal - EDB     Ankle EDB 4.8 ?6.5 0.4 ?2.0 100 Ankle - EDB 9   2.8     Fib head EDB 13.5  0.2  57.6 Fib head - Ankle 31 36 ?44 1.5     Pop fossa EDB 16.5  0.3  119 Pop fossa - Fib head 10 34 ?44 2.2         Pop fossa - Ankle      L Peroneal - EDB     Ankle EDB 5.5 ?6.5 2.5 ?2.0 100 Ankle - EDB 9   8.7     Fib head EDB 13.6  2.0  82 Fib head - Ankle 32 39 ?44 7.3     Pop fossa EDB 16.4  1.9  96.9 Pop fossa - Fib head 10 36 ?44 7.3         Pop fossa - Ankle      R Tibial - AH     Ankle AH 4.5 ?5.8 4.5 ?4.0 100 Ankle - AH 9   10.8     Pop fossa AH 17.2  3.0  66.9 Pop fossa - Ankle 41 32 ?41 11.9  L Tibial - AH     Ankle AH 5.1 ?5.8 5.0 ?4.0 100 Ankle - AH 9   11.2     Pop fossa AH 14.5  3.1  61.3 Pop fossa - Ankle 41 43 ?41 7.9                  [facility]    Nerve / Sites Rec. Site Peak Lat Ref.  Amp Ref. Segments Distance Peak Diff Ref.    ms ms V V  cm ms ms  R Sural - Ankle (Calf)     Calf Ankle NR ?4.4 NR ?6 Calf - Ankle 14    L Sural - Ankle (Calf)     Calf Ankle 4.6 ?4.4 4 ?6 Calf - Ankle 14    R Superficial peroneal - Ankle     Lat leg Ankle NR ?4.4 NR ?6 Lat leg - Ankle 14    L Superficial peroneal - Ankle     Lat leg Ankle 3.1 ?4.4 4 ?6 Lat leg - Ankle 14    R Median, Ulnar - Transcarpal comparison     Median Palm Wrist 2.4 ?2.2 37 ?35 Median Palm - Wrist 8       Ulnar Palm Wrist 2.1 ?2.2 15 ?12 Ulnar Palm - Wrist 8          Median Palm - Ulnar Palm  0.3 ?0.4  R Median -  Orthodromic (Dig  II, Mid palm)     Dig II Wrist 3.5 ?3.4 6 ?10 Dig II - Wrist 13    R Ulnar - Orthodromic, (Dig V, Mid palm)     Dig V Wrist 3.0 ?3.1 5 ?5 Dig V - Wrist 4111                     F  Wave    Nerve F Lat Ref.   ms ms  R Tibial - AH 60.2 ?56.0  L Tibial - AH 61.3 ?56.0  R Ulnar - ADM 31.0 ?32.0

## 2019-01-21 NOTE — Telephone Encounter (Signed)
Dr. Jannifer Franklin referred pt to [facility] home health for IV infusion of Radicava. I have contacted [facility] infusion department for [facility] and confirmed they have received the referral, they have added it to Epic. Next step, they will run through insurance and if a PA is needed they will contact our office.

## 2019-01-22 ENCOUNTER — Other Ambulatory Visit: Payer: Medicare Other

## 2019-01-22 ENCOUNTER — Ambulatory Visit: Payer: Self-pay | Admitting: Family Medicine

## 2019-01-23 ENCOUNTER — Other Ambulatory Visit: Payer: Medicare Other

## 2019-01-26 NOTE — Telephone Encounter (Signed)
PA has been approved.   Pa approval dates~ 10/28/2018-8-31/2021.  Pam with [facility] has been notified.

## 2019-01-26 NOTE — Telephone Encounter (Signed)
PA for Radicava has been initiated through cover my meds.  (Key: [id number])  Your information has been submitted to [place]. To check for an updated outcome later, reopen this PA request from your dashboard.  If [employer] has not responded to your request within 24 hours, contact [place] at [phone]. If you think there may be a problem with your PA request, use our live chat feature at the bottom right.

## 2019-01-27 DIAGNOSIS — M170 Bilateral primary osteoarthritis of knee: Secondary | ICD-10-CM | POA: Diagnosis not present

## 2019-01-28 NOTE — Telephone Encounter (Signed)
I have spoke with Vida Rigger who helps with the benefits investigation for Radicava.  Her # is [phone]. She was able to explain the pt's insurance benefits. Pt does not qualify for Radicava to be administered via home infusions and would have a large sum of money for co-payment. Pt does qualify for medication to be administered via a infusion suite. Pt would possibly be able to have med infused at [facility] short stay.  Pt was agreeable to exploring these options. I have contacted [name] back and advised of this conversation and she is going to help with possibly setting the pt up at one of these locations.  [name] is going to call me back once she has been in touch with the centers and let me know her findings.

## 2019-02-01 NOTE — Progress Notes (Deleted)
Jason Tate Date of Birth: 08/08/1945 Medical Record #[id number]  History of Present Illness: Jason Tate is seen for follow up of CAD. He is status post CABG in October 2012. He presented at that time with chest pain and a markedly abnormal stress Echo. He has a history of second degree heart block that resolved with stopping beta blocker therapy. He has a histor of OSA,hypercholesterolemia and gout. Myoview study in March 2018 showed evidence of prior infarct without ischemia. EF 48%.   On follow up today he is doing well from a cardiac standpoint. He denies any chest pain, dyspnea, palpitations or dizziness. He did have 2 falls in January and fractured L1. He is getting balance PT now.  Notes instability in his knee. Followed by Ortho.   Current Outpatient Medications on File Prior to Visit  Medication Sig Dispense Refill  . allopurinol (ZYLOPRIM) 100 MG tablet Take 1 tablet (100 mg total) by mouth daily. 90 tablet 3  . aspirin 81 MG tablet Take 81 mg by mouth daily.    . B Complex Vitamins (VITAMIN B COMPLEX PO) Take 1 tablet by mouth daily.     Marland Kitchen buPROPion (WELLBUTRIN XL) 300 MG 24 hr tablet Take 1 tablet (300 mg total) by mouth daily. 90 tablet 3  . Cetirizine HCl (ZYRTEC ALLERGY PO) Take 1 tablet by mouth daily as needed (allergies).     . Cholecalciferol (VITAMIN D PO) Take 4,000 mg by mouth daily.      . citalopram (CELEXA) 20 MG tablet Take 1 tablet (20 mg total) by mouth daily. 90 tablet 3  . clonazePAM (KLONOPIN) 1 MG tablet Take 1 tablet (1 mg total) by mouth at bedtime as needed. for sleep 90 tablet 1  . Edaravone (RADICAVA) 30 MG/100ML SOLN Inject 60 mg into the vein daily for 14 days. The patient will receive daily doses for 14 days, then receive 10 doses in a 14-day period for 2 weeks out of each month 200 mL 14  . fluticasone (FLONASE) 50 MCG/ACT nasal spray Place 1 spray into both nostrils daily as needed for allergies. 16 g 3  . FLUZONE HIGH-DOSE 0.5 ML injection TO BE  ADMINISTERED BY PHARMACIST FOR IMMUNIZATION  0  . Misc Natural Products (OSTEO BI-FLEX JOINT SHIELD PO) Take 1 tablet by mouth daily.     . Multiple Vitamins-Minerals (PRESERVISION/LUTEIN PO) Take 1 tablet by mouth daily.     . nabumetone (RELAFEN) 500 MG tablet Take 1 tablet (500 mg total) by mouth 2 (two) times daily as needed. 180 tablet 3  . Omega-3 Fatty Acids (FISH OIL PO) Take 1 tablet by mouth daily.     Marland Kitchen omeprazole (PRILOSEC) 20 MG capsule Take 1 capsule (20 mg total) by mouth daily. 90 capsule 3  . sildenafil (REVATIO) 20 MG tablet 1 - 5 PO daily as needed 50 tablet 11  . simvastatin (ZOCOR) 20 MG tablet Take 1 tablet (20 mg total) by mouth at bedtime. 90 tablet 3   No current facility-administered medications on file prior to visit.     No Known Allergies  Past Medical History:  Diagnosis Date  . Abnormal stress echo Oct 2012  . 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 Arthritis   . CAD (coronary artery disease) October 2012   s/p CABG x 2 per Dr. Roxy Manns  . Gait abnormality 12/25/2018  . Hypercholesterolemia   . Low testosterone   . Macular degeneration   . Mobitz type 1 second degree  atrioventricular block 04/04/2012  . Obesity   . PVC (premature ventricular contraction)   . Sleep apnea, obstructive    on BiPAP    Past Surgical History:  Procedure Laterality Date  . COLONOSCOPY    . CORONARY ARTERY BYPASS GRAFT  Oct 2012   LIMA to LAD and left radial to OM  . HEMORRHOIDECTOMY WITH HEMORRHOID BANDING    . INJECTION KNEE      Social History   Tobacco Use  Smoking Status Former Smoker  . Packs/day: 1.00  . Years: 20.00  . Pack years: 20.00  . Types: Cigarettes  . Quit date: 03/28/1980  . Years since quitting: 38.8  Smokeless Tobacco Never Used    Social History   Substance and Sexual Activity  Alcohol Use Yes  . Alcohol/week: 28.0 standard drinks  . Types: 28 Cans of beer per week   Comment: 4 beers a night    Family History  Problem  Relation Age of Onset  . Heart disease Mother   . Stroke Father   . Heart disease Father   . Dementia Father     Review of Systems: The review of systems is per the HPI.  All other systems were reviewed and are negative.  Physical Exam: There were no vitals taken for this visit. GENERAL:  Well appearing WM in NAD HEENT:  PERRL, EOMI, sclera are clear. Oropharynx is clear. NECK:  No jugular venous distention, carotid upstroke brisk and symmetric, no bruits, no thyromegaly or adenopathy LUNGS:  Clear to auscultation bilaterally CHEST:  Unremarkable HEART:  RRR,  PMI not displaced or sustained,S1 and S2 within normal limits, no S3, no S4: no clicks, no rubs, no murmurs ABD:  Soft, nontender. BS +, no masses or bruits. No hepatomegaly, no splenomegaly EXT:  2 + pulses throughout, no edema, no cyanosis no clubbing SKIN:  Warm and dry.  No rashes NEURO:  Alert and oriented x 3. Cranial nerves II through XII intact. PSYCH:  Cognitively intact   LABORATORY DATA: Lab Results  Component Value Date   WBC 7.7 12/25/2018   HGB 12.3 (L) 12/25/2018   HCT 36.4 (L) 12/25/2018   PLT 185 12/25/2018   GLUCOSE 104 (H) 12/25/2018   CHOL 149 06/10/2018   TRIG 83 06/10/2018   HDL 67 06/10/2018   LDLCALC 65 06/10/2018   ALT 26 12/25/2018   AST 30 12/25/2018   NA 140 12/25/2018   K 4.1 12/25/2018   CL 103 12/25/2018   CREATININE 1.05 12/25/2018   BUN 12 12/25/2018   CO2 23 12/25/2018   TSH 1.160 12/25/2018   INR 1.26 03/23/2011   HGBA1C 5.2 03/22/2011   Ecg today shows NSR with rate 88, sinus arrhythmia.  Septal infarct age undetermined. I have personally reviewed and interpreted this study.  Myoview 08/02/16: Study Highlights     Nuclear stress EF: 48%.  The left ventricular ejection fraction is mildly decreased (45-54%).  Blood pressure demonstrated a blunted response to exercise.  No T wave inversion was noted during stress.  Horizontal ST segment depression ST segment  depression of 1 mm was noted during stress in the II, III, aVF, V6, V5 and V4 leads.  Defect 1: There is a medium defect of moderate severity.  Findings consistent with prior myocardial infarction.  This is an intermediate risk study.   Moderate sized, medium intensity fixed anteroseptal, anteroapical, apical and inferoapical perfusion defect likely scar. No significant reversible ischemia. LVEF 48% with distal anteroapical and apical severe hypokinesis  to akinesis. This is an intermediate risk study.    Assessment / Plan: 1. Coronary disease status post CABG in October 2012. Stress Myoview in March 2018 without ischemia. 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  He has no anginal symptoms. Continue  current medication.   2. History of Mobitz type 1 second degree AV block. Resolved with stopping metoprolol.   3. Hyperlipidemia. Excellent control on Zocor.   I will follow up in one year.

## 2019-02-04 ENCOUNTER — Encounter: Payer: Self-pay | Admitting: Family Medicine

## 2019-02-04 ENCOUNTER — Ambulatory Visit (INDEPENDENT_AMBULATORY_CARE_PROVIDER_SITE_OTHER): Payer: Medicare Other | Admitting: Family Medicine

## 2019-02-04 ENCOUNTER — Other Ambulatory Visit: Payer: Self-pay

## 2019-02-04 VITALS — BP 130/80

## 2019-02-04 DIAGNOSIS — I25810 Atherosclerosis of coronary artery bypass graft(s) without angina pectoris: Secondary | ICD-10-CM

## 2019-02-04 DIAGNOSIS — Z951 Presence of aortocoronary bypass graft: Secondary | ICD-10-CM

## 2019-02-04 DIAGNOSIS — G1221 Amyotrophic lateral sclerosis: Secondary | ICD-10-CM

## 2019-02-04 DIAGNOSIS — H3530 Unspecified macular degeneration: Secondary | ICD-10-CM

## 2019-02-04 NOTE — Progress Notes (Signed)
Patient: Jason Tate Male    DOB: 05/24/1946   73 y.o.   MRN: [id number] Visit Date: 02/04/2019  Today's Provider: Megan Mansichard Gilbert Jr, MD   Chief Complaint  Patient presents with  . Fall   Subjective:     HPI  Patient has been diagnosed with ALS. Patient is experiencing weakness in legs and right arm. Patient states that he keeps falling. He fell last night in the den. Medication refill on Clonazepam.  Virtual Visit via Telephone Note  I connected with Jason Tate on 02/04/19 at 11:20 AM EDT by telephone and verified that I am speaking with the correct person using two identifiers.  I discussed the limitations, risks, security and privacy concerns of performing an evaluation and management service by telephone and the availability of in person appointments. I also discussed with the patient that there may be a patient responsible charge related to this service. The patient expressed understanding and agreed to proceed.    I discussed the assessment and treatment plan with the patient. The patient was provided an opportunity to ask questions and all were answered. The patient agreed with the plan and demonstrated an understanding of the instructions.   The patient was advised to call back or seek an in-person evaluation if the symptoms worsen or if the condition fails to improve as anticipated.  Pt has been diagnosed with ALS and he and his wife are trying  To move to a single level home. He seems to be handling situation well. All other issues are stable.  No Known Allergies   Current Outpatient Medications:  .  allopurinol (ZYLOPRIM) 100 MG tablet, Take 1 tablet (100 mg total) by mouth daily., Disp: 90 tablet, Rfl: 3 .  aspirin 81 MG tablet, Take 81 mg by mouth daily., Disp: , Rfl:  .  B Complex Vitamins (VITAMIN B COMPLEX PO), Take 1 tablet by mouth daily. , Disp: , Rfl:  .  buPROPion (WELLBUTRIN XL) 300 MG 24 hr tablet, Take 1 tablet (300 mg total) by mouth  daily., Disp: 90 tablet, Rfl: 3 .  Cholecalciferol (VITAMIN D PO), Take 4,000 mg by mouth daily.  , Disp: , Rfl:  .  citalopram (CELEXA) 20 MG tablet, Take 1 tablet (20 mg total) by mouth daily., Disp: 90 tablet, Rfl: 3 .  clonazePAM (KLONOPIN) 1 MG tablet, Take 1 tablet (1 mg total) by mouth at bedtime as needed. for sleep, Disp: 90 tablet, Rfl: 1 .  fluticasone (FLONASE) 50 MCG/ACT nasal spray, Place 1 spray into both nostrils daily as needed for allergies., Disp: 16 g, Rfl: 3 .  Misc Natural Products (OSTEO BI-FLEX JOINT SHIELD PO), Take 1 tablet by mouth daily. , Disp: , Rfl:  .  Multiple Vitamins-Minerals (PRESERVISION/LUTEIN PO), Take 1 tablet by mouth daily. , Disp: , Rfl:  .  nabumetone (RELAFEN) 500 MG tablet, Take 1 tablet (500 mg total) by mouth 2 (two) times daily as needed., Disp: 180 tablet, Rfl: 3 .  Omega-3 Fatty Acids (FISH OIL PO), Take 1 tablet by mouth daily. , Disp: , Rfl:  .  omeprazole (PRILOSEC) 20 MG capsule, Take 1 capsule (20 mg total) by mouth daily., Disp: 90 capsule, Rfl: 3 .  sildenafil (REVATIO) 20 MG tablet, 1 - 5 PO daily as needed, Disp: 50 tablet, Rfl: 11 .  simvastatin (ZOCOR) 20 MG tablet, Take 1 tablet (20 mg total) by mouth at bedtime., Disp: 90 tablet, Rfl: 3 .  Cetirizine  HCl (ZYRTEC ALLERGY PO), Take 1 tablet by mouth daily as needed (allergies). , Disp: , Rfl:  .  Edaravone (RADICAVA) 30 MG/100ML SOLN, Inject 60 mg into the vein daily for 14 days. The patient will receive daily doses for 14 days, then receive 10 doses in a 14-day period for 2 weeks out of each month, Disp: 200 mL, Rfl: 14 .  FLUZONE HIGH-DOSE 0.5 ML injection, TO BE ADMINISTERED BY PHARMACIST FOR IMMUNIZATION, Disp: , Rfl: 0  Review of Systems  Social History   Tobacco Use  . Smoking status: Former Smoker    Packs/day: 1.00    Years: 20.00    Pack years: 20.00    Types: Cigarettes    Quit date: 03/28/1980    Years since quitting: 38.8  . Smokeless tobacco: Never Used  Substance  Use Topics  . Alcohol use: Yes    Alcohol/week: 28.0 standard drinks    Types: 28 Cans of beer per week    Comment: 4 beers a night      Objective:   BP 130/80  Vitals:   02/04/19 1126  BP: 130/80  There is no height or weight on file to calculate BMI.   Physical Exam   No results found for any visits on 02/04/19.     Assessment & Plan    1. ALS (amyotrophic lateral sclerosis) [facility]) Neurology following. Might start cutting back on some meds as this progresses.   2. S/P CABG x 2   3. Coronary artery disease involving coronary bypass graft of native heart without angina pectoris   4. Macular degeneration of both eyes, unspecified type      Jason Durie, MD  [facility]

## 2019-02-05 DIAGNOSIS — M8000XA Age-related osteoporosis with current pathological fracture, unspecified site, initial encounter for fracture: Secondary | ICD-10-CM | POA: Diagnosis not present

## 2019-02-05 DIAGNOSIS — M4856XA Collapsed vertebra, not elsewhere classified, lumbar region, initial encounter for fracture: Secondary | ICD-10-CM | POA: Diagnosis not present

## 2019-02-05 DIAGNOSIS — S22000A Wedge compression fracture of unspecified thoracic vertebra, initial encounter for closed fracture: Secondary | ICD-10-CM | POA: Diagnosis not present

## 2019-02-05 DIAGNOSIS — G1221 Amyotrophic lateral sclerosis: Secondary | ICD-10-CM | POA: Diagnosis not present

## 2019-02-06 ENCOUNTER — Ambulatory Visit: Payer: Medicare Other | Admitting: Cardiology

## 2019-02-10 NOTE — Telephone Encounter (Signed)
Jason Tate returned my call. She has reached out to the pharmacist director Byrd Hesselbach ( phone # [phone]) and lvm to discuss adding Ravicava to [facility] formulary. Jason Tate is requesting Dr. Jannifer Franklin call and speak with Byrd Hesselbach as well to help expedite this process.

## 2019-02-10 NOTE — Telephone Encounter (Signed)
Late Entry 02/04/2019 [name] called me back and she and I further discussed. Radicava is currently not on the formulary for [facility] short stay centers to infuse. Nancy recommended I call [facility] in [place] and see if they could accept the pt.  Late Entry 02/05/2019 I called [facility] and spoke with Nurse Tamra. She states the infusion suite could not be used for Radicava since the pt would have to receive the infusion 14 days in a row and the infusion suite is not opened on Saturdays and Sundays. Pt would not qualify to receive home infusions through [facility] either due to [employer].  02/10/2019  [name] called and she and I discussed the information above. She is going to try and get in touch with the Pharmacy director at [facility] in regards to getting Radicava added to the formulary. She is going to call back once she has more information.

## 2019-02-11 NOTE — Telephone Encounter (Signed)
I contacted [facility] out pt pharmacy and left a vm asking for a call back from the pharmacist to discuss adding Radicava to the infusion formulary.

## 2019-02-11 NOTE — Telephone Encounter (Signed)
I have called 3 times, once yesterday, once this morning, once and the afternoon, left messages, unable to reach a person at the number listed.

## 2019-02-11 NOTE — Telephone Encounter (Signed)
I have called 4 times at the number given, the voicemail for a Jason Tate comes on, no one will answer.  I wonder if there is another number that I can reach [facility].

## 2019-02-16 DIAGNOSIS — R296 Repeated falls: Secondary | ICD-10-CM | POA: Diagnosis not present

## 2019-02-16 DIAGNOSIS — G1221 Amyotrophic lateral sclerosis: Secondary | ICD-10-CM | POA: Diagnosis not present

## 2019-02-16 NOTE — Telephone Encounter (Signed)
I have reached out to [employer] ( [place] pt support) and advised we have attempted to reach Ms. Jason Tate several times in regards to adding the med to [facility] formulary. [name] is going to try and locate a e-mail or another # and call me back.

## 2019-02-16 NOTE — Telephone Encounter (Signed)
I contacted [facility] and left second vm for a pharmacist to call back so I could discuss this message further.

## 2019-02-17 DIAGNOSIS — R471 Dysarthria and anarthria: Secondary | ICD-10-CM | POA: Diagnosis not present

## 2019-02-17 DIAGNOSIS — Z79899 Other long term (current) drug therapy: Secondary | ICD-10-CM | POA: Diagnosis not present

## 2019-02-17 DIAGNOSIS — R790 Abnormal level of blood mineral: Secondary | ICD-10-CM | POA: Diagnosis not present

## 2019-02-17 DIAGNOSIS — M6281 Muscle weakness (generalized): Secondary | ICD-10-CM | POA: Diagnosis not present

## 2019-02-17 DIAGNOSIS — G1221 Amyotrophic lateral sclerosis: Secondary | ICD-10-CM | POA: Diagnosis not present

## 2019-02-17 DIAGNOSIS — R262 Difficulty in walking, not elsewhere classified: Secondary | ICD-10-CM | POA: Diagnosis not present

## 2019-02-17 DIAGNOSIS — Z5181 Encounter for therapeutic drug level monitoring: Secondary | ICD-10-CM | POA: Diagnosis not present

## 2019-02-17 NOTE — Telephone Encounter (Signed)
An email was sent today.

## 2019-02-17 NOTE — Telephone Encounter (Signed)
Late entry 02/16/2019 Jason Tate got back with our office and spoke with [place]. She was able to relay [employer] e-mail contact. Her e-mail [email or web address].

## 2019-02-20 ENCOUNTER — Telehealth: Payer: Self-pay | Admitting: Neurology

## 2019-02-20 NOTE — Telephone Encounter (Signed)
I have been in contact by email with Clayburn Pert from [facility].  They seem to be willing to initiate Radicava infusions for this patient.     Dr. Jannifer Franklin,  Orpah Cobb was able to perform much of the background work around this topic today.   We can see where the benefits investigation was completed and that the patient is enrolled in the [employer] program with an ID # assigned.    Regarding the infusion schedule, even though Medical Day is not open on weekends, it is possible for the patient to receive weekend outpatient infusions.  The appointments are coordinated with Medical Day secretary as usual but on weekends the patient would register in the ED and the weekend IV team would provide the infusions.  We have requested that an Order Build for Radicava be created in [facility] and in the [facility] infusion pump system.    Please have someone from your office contact us to: 1. Confirm that the patient's insurance coverage has not changed since August of 2020. 2. Discuss potential timeline for patient's infusion appointments.  --Gaspar Skeeters, PharmD, [facility] Assistant Director of Pharmacy, [place], Institutional Review Board Coordinator, [place] Planning Team Direct Dial: [phone]  Fax: [phone] Website: [email or web address]

## 2019-02-23 NOTE — Telephone Encounter (Signed)
I received an e-mail back from [name] needing to verify if the pt's insurance had changed since the search light report on 01/27/2019.  I reached out to the pt and spoke with his wife Jason Tate ok per [facility]) and she confirmed insurance has not changed, I notified [name] via e-mail. Jason Tate did mention the pt saw Dr. Truman Hayward at [facility] on 9/ 21 & 9/22 and was started on Riluzole. Pt has expressed to his wife he may not start the [place] now but is ok with proceeding and will make his final decision once medication has been added to the hospitals formulary.

## 2019-02-23 NOTE — Telephone Encounter (Signed)
Hilda Blades has fwd me the searchlight report and I have sent via e-mail to Mr. Absher and Mr. Tresa Res.  I have reached out to [facility] and updated on this piece of information as well. She advised an in service  for [place] maybe held this week or next.

## 2019-02-23 NOTE — Telephone Encounter (Addendum)
I have asked Hilda Blades to scan the searchlight benefits investigation form into an e-mail and send to me so I can send information to Mr. Absher.

## 2019-02-26 DIAGNOSIS — S22000A Wedge compression fracture of unspecified thoracic vertebra, initial encounter for closed fracture: Secondary | ICD-10-CM | POA: Diagnosis not present

## 2019-02-26 DIAGNOSIS — M8000XA Age-related osteoporosis with current pathological fracture, unspecified site, initial encounter for fracture: Secondary | ICD-10-CM | POA: Diagnosis not present

## 2019-02-26 DIAGNOSIS — M546 Pain in thoracic spine: Secondary | ICD-10-CM | POA: Diagnosis not present

## 2019-02-26 DIAGNOSIS — M4856XA Collapsed vertebra, not elsewhere classified, lumbar region, initial encounter for fracture: Secondary | ICD-10-CM | POA: Diagnosis not present

## 2019-02-26 DIAGNOSIS — G1221 Amyotrophic lateral sclerosis: Secondary | ICD-10-CM | POA: Diagnosis not present

## 2019-02-26 NOTE — Telephone Encounter (Signed)
Received e-mail from Jason Tate with [name] advising Jason Tate could be scheduled for Radicava infusion anytime after 03/19/2019. I contacted the pt and left a vm asking him to call me back to verify if he is ok with pursuing treatment.

## 2019-02-27 NOTE — Telephone Encounter (Signed)
Pt has called to inform that Dr Jannifer Franklin referred him to [facility], they have given him an oral medication and do not want him to take the infusion.  This is FYI, pt has not asked for a call back

## 2019-03-02 NOTE — Telephone Encounter (Signed)
I called the patient.  I confirmed that he did make contact with Dr. Truman Hayward at [facility], he has not yet been seen by Dr. Hosie Poisson.  They have recommended that he not go on Radicava, and started him on R[place].  The patient will continue to be followed at [facility], we will not do the [place].

## 2019-03-03 ENCOUNTER — Ambulatory Visit (INDEPENDENT_AMBULATORY_CARE_PROVIDER_SITE_OTHER): Payer: Medicare Other

## 2019-03-03 DIAGNOSIS — S32030A Wedge compression fracture of third lumbar vertebra, initial encounter for closed fracture: Secondary | ICD-10-CM | POA: Diagnosis not present

## 2019-03-03 DIAGNOSIS — S32010A Wedge compression fracture of first lumbar vertebra, initial encounter for closed fracture: Secondary | ICD-10-CM | POA: Diagnosis not present

## 2019-03-03 DIAGNOSIS — Z23 Encounter for immunization: Secondary | ICD-10-CM | POA: Diagnosis not present

## 2019-03-03 DIAGNOSIS — S22060A Wedge compression fracture of T7-T8 vertebra, initial encounter for closed fracture: Secondary | ICD-10-CM | POA: Diagnosis not present

## 2019-03-03 DIAGNOSIS — S22080A Wedge compression fracture of T11-T12 vertebra, initial encounter for closed fracture: Secondary | ICD-10-CM | POA: Diagnosis not present

## 2019-03-03 DIAGNOSIS — R030 Elevated blood-pressure reading, without diagnosis of hypertension: Secondary | ICD-10-CM | POA: Diagnosis not present

## 2019-03-04 ENCOUNTER — Other Ambulatory Visit: Payer: Self-pay | Admitting: Family Medicine

## 2019-03-04 DIAGNOSIS — G1221 Amyotrophic lateral sclerosis: Secondary | ICD-10-CM

## 2019-03-04 DIAGNOSIS — G4709 Other insomnia: Secondary | ICD-10-CM

## 2019-03-04 DIAGNOSIS — F5102 Adjustment insomnia: Secondary | ICD-10-CM

## 2019-03-04 MED ORDER — CLONAZEPAM 1 MG PO TABS: 1.0000 mg | ORAL_TABLET | Freq: Every evening | ORAL | 1 refills | Status: DC | PRN

## 2019-03-04 NOTE — Progress Notes (Signed)
Pt now with ALS. Not sleeping at night. Not suicidal.

## 2019-03-24 DIAGNOSIS — Z5181 Encounter for therapeutic drug level monitoring: Secondary | ICD-10-CM | POA: Diagnosis not present

## 2019-03-25 ENCOUNTER — Telehealth: Payer: Self-pay | Admitting: Family Medicine

## 2019-03-25 ENCOUNTER — Other Ambulatory Visit: Payer: Self-pay | Admitting: *Deleted

## 2019-03-25 DIAGNOSIS — G1221 Amyotrophic lateral sclerosis: Secondary | ICD-10-CM

## 2019-03-25 DIAGNOSIS — I25810 Atherosclerosis of coronary artery bypass graft(s) without angina pectoris: Secondary | ICD-10-CM

## 2019-03-25 NOTE — Telephone Encounter (Signed)
We have not received this yet. Just FYI.

## 2019-03-25 NOTE — Telephone Encounter (Signed)
Dr. Nicoletta Dress- necrologist at [facility] is supposed to be faxng a request for labs to be done on pt. Pt wants to know if Dr. Rosanna Randy has received that and what is next.Marland Kitchen  Please call pt back to let him know.  Thanks, [employer]

## 2019-03-31 DIAGNOSIS — G1221 Amyotrophic lateral sclerosis: Secondary | ICD-10-CM | POA: Diagnosis not present

## 2019-03-31 DIAGNOSIS — I25810 Atherosclerosis of coronary artery bypass graft(s) without angina pectoris: Secondary | ICD-10-CM | POA: Diagnosis not present

## 2019-04-01 ENCOUNTER — Telehealth: Payer: Self-pay | Admitting: *Deleted

## 2019-04-01 LAB — HEPATIC FUNCTION PANEL
ALT: 27 IU/L (ref 0–44)
AST: 25 IU/L (ref 0–40)
Albumin: 4.5 g/dL (ref 3.7–4.7)
Alkaline Phosphatase: 66 IU/L (ref 39–117)
Bilirubin Total: 0.6 mg/dL (ref 0.0–1.2)
Bilirubin, Direct: 0.18 mg/dL (ref 0.00–0.40)
Total Protein: 6.5 g/dL (ref 6.0–8.5)

## 2019-04-01 NOTE — Telephone Encounter (Signed)
Tried calling pt, no answer and no vm

## 2019-04-01 NOTE — Telephone Encounter (Signed)
-----   Message from Jerrol Banana., MD sent at 04/01/2019 10:01 AM EST ----- Liver function normal.

## 2019-04-02 NOTE — Progress Notes (Signed)
Virtual Visit via Telephone Note   This visit type was conducted due to national recommendations for restrictions regarding the COVID-19 Pandemic (e.g. social distancing) in an effort to limit this patient's exposure and mitigate transmission in our community.  Due to his co-morbid illnesses, this patient is at least at moderate risk for complications without adequate follow up.  This format is felt to be most appropriate for this patient at this time.  The patient did not have access to video technology/had technical difficulties with video requiring transitioning to audio format only (telephone).  All issues noted in this document were discussed and addressed.  No physical exam could be performed with this format.  Please refer to the patient's chart for his  consent to telehealth for [facility].   Date:  04/07/2019   ID:  Jason Tate, DOB 12/27/1945, MRN [id number]  Patient Location: Home Provider Location: Home  PCP:  [name]., MD  Cardiologist:  Gennett Garcia [place] MD Electrophysiologist:  None   Evaluation Performed:  Follow-Up Visit  Chief Complaint:  CAD  History of Present Illness:    Jason Tate is a 73 y.o. male with history of CAD. He is status post CABG in October 2012. He presented at that time with chest pain and a markedly abnormal stress Echo. He has a history of second degree heart block that resolved with stopping beta blocker therapy. He has a history of OSA, hypercholesterolemia and gout. Uses Bipap.  Myoview study in March 2018 showed evidence of prior infarct without ischemia. EF 48%.   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 Had multiple falls this summer with fractured spine. Followed by Dr Anne Hahn and at [facility]. He has lost a lot of strength in his right arm and walks with a walker. He has lost about 25 lbs. Now living in a townhome. Has good support with his wife and son.  The patient does not have symptoms concerning for  COVID-19 infection (fever, chills, cough, or new shortness of breath).    Past Medical History:  Diagnosis Date  . Abnormal stress echo Oct 2012  . 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 Arthritis   . CAD (coronary artery disease) October 2012   s/p CABG x 2 per Dr. Cornelius Moras  . Gait abnormality 12/25/2018  . Hypercholesterolemia   . Low testosterone   . Macular degeneration   . 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 Obesity   . PVC (premature ventricular contraction)   . Sleep apnea, obstructive    on BiPAP   Past Surgical History:  Procedure Laterality Date  . COLONOSCOPY    . CORONARY ARTERY BYPASS GRAFT  Oct 2012   LIMA to LAD and left radial to OM  . HEMORRHOIDECTOMY WITH HEMORRHOID BANDING    . INJECTION KNEE       Current Meds  Medication Sig  . allopurinol (ZYLOPRIM) 100 MG tablet Take 1 tablet (100 mg total) by mouth daily.  Marland Kitchen aspirin 81 MG tablet Take 81 mg by mouth daily.  . B Complex Vitamins (VITAMIN B COMPLEX PO) Take 1 tablet by mouth daily.   Marland Kitchen buPROPion (WELLBUTRIN XL) 300 MG 24 hr tablet Take 1 tablet (300 mg total) by mouth daily.  . Cetirizine HCl (ZYRTEC ALLERGY PO) Take 1 tablet by mouth daily as needed (allergies).   . Cholecalciferol (VITAMIN D PO) Take 4,000 mg by mouth daily.    . citalopram (CELEXA)  20 MG tablet Take 1 tablet (20 mg total) by mouth daily.  . clonazePAM (KLONOPIN) 1 MG tablet Take 1 tablet (1 mg total) by mouth at bedtime as needed. for sleep  . Copper 5 MG TABS Take 5 mg by mouth daily.  . fluticasone (FLONASE) 50 MCG/ACT nasal spray Place 1 spray into both nostrils daily as needed for allergies.  . nabumetone (RELAFEN) 500 MG tablet Take 1 tablet (500 mg total) by mouth 2 (two) times daily as needed.  . Omega-3 Fatty Acids (FISH OIL PO) Take 1 tablet by mouth daily.   Marland Kitchen omeprazole (PRILOSEC) 20 MG capsule Take 1 capsule (20 mg total) by mouth daily.  . riluzole (RILUTEK) 50 MG tablet Take 50 mg  by mouth every 12 (twelve) hours.  . sildenafil (REVATIO) 20 MG tablet 1 - 5 PO daily as needed  . simvastatin (ZOCOR) 20 MG tablet Take 1 tablet (20 mg total) by mouth at bedtime.  . [DISCONTINUED] FLUZONE HIGH-DOSE 0.5 ML injection TO BE ADMINISTERED BY PHARMACIST FOR IMMUNIZATION     Allergies:   Patient has no known allergies.   Social History   Tobacco Use  . Smoking status: Former Smoker    Packs/day: 1.00    Years: 20.00    Pack years: 20.00    Types: Cigarettes    Quit date: 03/28/1980    Years since quitting: 39.0  . Smokeless tobacco: Never Used  Substance Use Topics  . Alcohol use: Yes    Alcohol/week: 28.0 standard drinks    Types: 28 Cans of beer per week    Comment: 4 beers a night  . Drug use: No     Family Hx: The patient's family history includes Dementia in his father; Heart disease in his father and mother; Stroke in his father.  ROS:   Please see the history of present illness.    All other systems reviewed and are negative.   Prior CV studies:   The following studies were reviewed today:  Myoview 08/02/16: Study Highlights     Nuclear stress EF: 48%.  The left ventricular ejection fraction is mildly decreased (45-54%).  Blood pressure demonstrated a blunted response to exercise.  No T wave inversion was noted during stress.  Horizontal ST segment depression ST segment depression of 1 mm was noted during stress in the II, III, aVF, V6, V5 and V4 leads.  Defect 1: There is a medium defect of moderate severity.  Findings consistent with prior myocardial infarction.  This is an intermediate risk study.  Moderate sized, medium intensity fixed anteroseptal, anteroapical, apical and inferoapical perfusion defect likely scar. No significant reversible ischemia. LVEF 48% with distal anteroapical and apical severe hypokinesis to akinesis. This is an intermediate risk study.     Labs/Other Tests and Data Reviewed:    EKG:  No ECG reviewed.   Recent Labs: 12/25/2018: BUN 12; Creatinine, Ser 1.05; Hemoglobin 12.3; Platelets 185; Potassium 4.1; Sodium 140; TSH 1.160 03/31/2019: ALT 27   Recent Lipid Panel Lab Results  Component Value Date/Time   CHOL 149 06/10/2018 11:27 AM   TRIG 83 06/10/2018 11:27 AM   HDL 67 06/10/2018 11:27 AM   CHOLHDL 2.2 06/10/2018 11:27 AM   CHOLHDL 2.8 08/01/2016 01:06 PM   LDLCALC 65 06/10/2018 11:27 AM    Wt Readings from Last 3 Encounters:  04/07/19 187 lb (84.8 kg)  01/21/19 195 lb (88.5 kg)  12/25/18 202 lb (91.6 kg)     Objective:    Vital  Signs:  BP 120/65   Pulse 90   Ht 6\' 1"  (1.854 m)   Wt 187 lb (84.8 kg)   BMI 24.67 kg/m    VITAL SIGNS:  reviewed  ASSESSMENT & PLAN:    1. Coronary disease status post CABG in October 2012. Stress Myoview in March 2018 without ischemia. 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  He has no anginal symptoms. Continue  current medication.   2. History of Mobitz type 1 second degree AV block. Resolved with stopping metoprolol.   3. Hyperlipidemia. Excellent control on Zocor.    COVID-19 Education: The signs and symptoms of COVID-19 were discussed with the patient and how to seek care for testing (follow up with PCP or arrange E-visit).  The importance of social distancing was discussed today.  Time:   Today, I have spent 10 minutes with the patient with telehealth technology discussing the above problems.     Medication Adjustments/Labs and Tests Ordered: Current medicines are reviewed at length with the patient today.  Concerns regarding medicines are outlined above.   Tests Ordered: No orders of the defined types were placed in this encounter.   Medication Changes: No orders of the defined types were placed in this encounter.   Follow Up:  In Person in 1 year(s)  Signed, Cheynne Virden [place], MD  04/07/2019 8:48 AM    [facility]

## 2019-04-06 ENCOUNTER — Ambulatory Visit: Payer: Medicare Other | Admitting: Cardiology

## 2019-04-07 ENCOUNTER — Encounter: Payer: Self-pay | Admitting: Cardiology

## 2019-04-07 ENCOUNTER — Telehealth (INDEPENDENT_AMBULATORY_CARE_PROVIDER_SITE_OTHER): Payer: Medicare Other | Admitting: Cardiology

## 2019-04-07 VITALS — BP 120/65 | HR 90 | Ht 73.0 in | Wt 187.0 lb

## 2019-04-07 DIAGNOSIS — R2689 Other abnormalities of gait and mobility: Secondary | ICD-10-CM | POA: Diagnosis not present

## 2019-04-07 DIAGNOSIS — I25810 Atherosclerosis of coronary artery bypass graft(s) without angina pectoris: Secondary | ICD-10-CM

## 2019-04-07 DIAGNOSIS — G1221 Amyotrophic lateral sclerosis: Secondary | ICD-10-CM | POA: Diagnosis not present

## 2019-04-07 DIAGNOSIS — Z951 Presence of aortocoronary bypass graft: Secondary | ICD-10-CM | POA: Diagnosis not present

## 2019-04-07 DIAGNOSIS — M8580 Other specified disorders of bone density and structure, unspecified site: Secondary | ICD-10-CM | POA: Diagnosis not present

## 2019-04-07 DIAGNOSIS — M4856XA Collapsed vertebra, not elsewhere classified, lumbar region, initial encounter for fracture: Secondary | ICD-10-CM | POA: Diagnosis not present

## 2019-04-07 DIAGNOSIS — S22000A Wedge compression fracture of unspecified thoracic vertebra, initial encounter for closed fracture: Secondary | ICD-10-CM | POA: Diagnosis not present

## 2019-04-07 DIAGNOSIS — E7800 Pure hypercholesterolemia, unspecified: Secondary | ICD-10-CM

## 2019-04-07 NOTE — Patient Instructions (Signed)
Medication Instructions:  Continue same medications *If you need a refill on your cardiac medications before your next appointment, please call your pharmacy*  Lab Work: None ordered   Testing/Procedures: None ordered  Follow-Up: At [facility], you and your health needs are our priority.  As part of our continuing mission to provide you with exceptional heart care, we have created designated Provider Care Teams.  These Care Teams include your primary Cardiologist (physician) and Advanced Practice Providers (APPs -  Physician Assistants and Nurse Practitioners) who all work together to provide you with the care you need, when you need it.  Your next appointment:  1 year    ( Call in August to schedule Nov appointment )   The format for your next appointment:  Office   Provider:  Dr.Jordan   

## 2019-04-08 ENCOUNTER — Ambulatory Visit: Payer: Medicare Other | Admitting: Family Medicine

## 2019-04-20 DIAGNOSIS — M545 Low back pain: Secondary | ICD-10-CM | POA: Diagnosis not present

## 2019-04-21 DIAGNOSIS — M6281 Muscle weakness (generalized): Secondary | ICD-10-CM | POA: Diagnosis not present

## 2019-04-21 DIAGNOSIS — Z79899 Other long term (current) drug therapy: Secondary | ICD-10-CM | POA: Diagnosis not present

## 2019-04-21 DIAGNOSIS — R262 Difficulty in walking, not elsewhere classified: Secondary | ICD-10-CM | POA: Diagnosis not present

## 2019-04-21 DIAGNOSIS — R471 Dysarthria and anarthria: Secondary | ICD-10-CM | POA: Diagnosis not present

## 2019-04-21 DIAGNOSIS — Z5181 Encounter for therapeutic drug level monitoring: Secondary | ICD-10-CM | POA: Diagnosis not present

## 2019-04-21 DIAGNOSIS — E610 Copper deficiency: Secondary | ICD-10-CM | POA: Diagnosis not present

## 2019-04-21 DIAGNOSIS — G1221 Amyotrophic lateral sclerosis: Secondary | ICD-10-CM | POA: Diagnosis not present

## 2019-04-28 DIAGNOSIS — M545 Low back pain: Secondary | ICD-10-CM | POA: Diagnosis not present

## 2019-04-30 DIAGNOSIS — M545 Low back pain: Secondary | ICD-10-CM | POA: Diagnosis not present

## 2019-05-04 DIAGNOSIS — M545 Low back pain: Secondary | ICD-10-CM | POA: Diagnosis not present

## 2019-05-06 ENCOUNTER — Encounter: Payer: Self-pay | Admitting: Neurology

## 2019-05-06 ENCOUNTER — Other Ambulatory Visit: Payer: Self-pay

## 2019-05-06 ENCOUNTER — Ambulatory Visit (INDEPENDENT_AMBULATORY_CARE_PROVIDER_SITE_OTHER): Payer: Medicare Other | Admitting: Neurology

## 2019-05-06 VITALS — BP 162/84 | HR 110 | Temp 97.0°F | Ht 72.0 in | Wt 194.0 lb

## 2019-05-06 DIAGNOSIS — R269 Unspecified abnormalities of gait and mobility: Secondary | ICD-10-CM | POA: Diagnosis not present

## 2019-05-06 DIAGNOSIS — I25810 Atherosclerosis of coronary artery bypass graft(s) without angina pectoris: Secondary | ICD-10-CM

## 2019-05-06 DIAGNOSIS — M545 Low back pain: Secondary | ICD-10-CM | POA: Diagnosis not present

## 2019-05-06 DIAGNOSIS — G1221 Amyotrophic lateral sclerosis: Secondary | ICD-10-CM | POA: Diagnosis not present

## 2019-05-06 NOTE — Progress Notes (Signed)
Reason for visit: ALS  Jason Tate is an 73 y.o. male  History of present illness:  Jason Tate is a 73 year old right-handed white male with a history of ALS.  The patient is now followed through [facility], he sees Dr. Zannie Kehr.  The patient is on rilutek and is tolerating this fairly well.  He reports occasional muscle cramps that are not severe.  The patient has fallen 2 months ago, he has not sustained significant injury.  He has moved from his 3 level house to a 1 level townhouse.  The patient is walking with a walker.  He has had progression of weakness particularly in the right arm and in the legs.  He is getting physical therapy at this time.  The patient is taking TUDCA as part of a dietary regimen for the last.  He returns for an evaluation.  Currently, he is not able to feed himself well with the right hand, he has to use the left.  He denies any problems with speech or swallowing and he has not had trouble with breathing.  Past Medical History:  Diagnosis Date  . Abnormal stress echo Oct 2012  . 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 Arthritis   . CAD (coronary artery disease) October 2012   s/p CABG x 2 per Dr. Cornelius Moras  . Gait abnormality 12/25/2018  . Hypercholesterolemia   . Low testosterone   . Macular degeneration   . 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 Obesity   . PVC (premature ventricular contraction)   . Sleep apnea, obstructive    on BiPAP    Past Surgical History:  Procedure Laterality Date  . COLONOSCOPY    . CORONARY ARTERY BYPASS GRAFT  Oct 2012   LIMA to LAD and left radial to OM  . HEMORRHOIDECTOMY WITH HEMORRHOID BANDING    . INJECTION KNEE      Family History  Problem Relation Age of Onset  . Heart disease Mother   . Stroke Father   . Heart disease Father   . Dementia Father     Social history:  reports that he quit smoking about 39 years ago. His smoking use included cigarettes. He has a  20.00 pack-year smoking history. He has never used smokeless tobacco. He reports current alcohol use of about 28.0 standard drinks of alcohol per week. He reports that he does not use drugs.    Allergies  Allergen Reactions  . Penicillins Nausea Only    Medications:  Prior to Admission medications   Medication Sig Start Date End Date Taking? Authorizing Provider  allopurinol (ZYLOPRIM) 100 MG tablet Take 1 tablet (100 mg total) by mouth daily. 07/23/18  Yes [name]., MD  aspirin 81 MG tablet Take 81 mg by mouth daily.   Yes [provider]  B Complex Vitamins (VITAMIN B COMPLEX PO) Take 1 tablet by mouth daily.    Yes [provider]  buPROPion (WELLBUTRIN XL) 300 MG 24 hr tablet Take 1 tablet (300 mg total) by mouth daily. 07/23/18  Yes [name]., MD  Cetirizine HCl (ZYRTEC ALLERGY PO) Take 1 tablet by mouth daily as needed (allergies).    Yes [provider]  Cholecalciferol (VITAMIN D PO) Take 4,000 mg by mouth daily.     Yes [provider]  citalopram (CELEXA) 20 MG tablet Take 1 tablet (20 mg total) by mouth daily. 07/23/18  Yes [name].,  MD  clonazePAM (KLONOPIN) 1 MG tablet Take 1 tablet (1 mg total) by mouth at bedtime as needed. for sleep 03/04/19  Yes Jerrol Banana., MD  Copper 5 MG TABS Take 5 mg by mouth daily.   Yes [provider]  fluticasone (FLONASE) 50 MCG/ACT nasal spray Place 1 spray into both nostrils daily as needed for allergies. 09/11/17  Yes Jerrol Banana., MD  nabumetone (RELAFEN) 500 MG tablet Take 1 tablet (500 mg total) by mouth 2 (two) times daily as needed. 07/23/18  Yes Jerrol Banana., MD  NON FORMULARY TUDCA 1 gm 2 per day   Yes [provider]  Omega-3 Fatty Acids (FISH OIL PO) Take 1 tablet by mouth daily.    Yes [provider]  omeprazole (PRILOSEC) 20 MG capsule Take 1 capsule (20 mg total) by mouth daily. 07/23/18  Yes Jerrol Banana., MD  riluzole (RILUTEK) 50 MG tablet Take 50 mg by mouth every 12 (twelve) hours.   Yes [provider]  sildenafil (REVATIO) 20 MG tablet 1 - 5 PO daily as needed 06/06/17  Yes Jerrol Banana., MD  simvastatin (ZOCOR) 20 MG tablet Take 1 tablet (20 mg total) by mouth at bedtime. 07/23/18  Yes Jerrol Banana., MD    ROS:  Out of a complete 14 system review of symptoms, the patient complains only of the following symptoms, and all other reviewed systems are negative.  Weakness Walking difficulty  Blood pressure (!) 162/84, pulse (!) 110, temperature (!) 97 F (36.1 C), temperature source Temporal, height 6' (1.829 m), weight 194 lb (88 kg).  Physical Exam  General: The patient is alert and cooperative at the time of the examination.  Skin: No significant peripheral edema is noted.   Neurologic Exam  Mental status: The patient is alert and oriented x 3 at the time of the examination. The patient has apparent normal recent and remote memory, with an apparently normal attention span and concentration ability.   Cranial nerves: Facial symmetry is present. Speech is normal, no aphasia or dysarthria is noted. Extraocular movements are full. Visual fields are full.  Motor: The patient has relatively good biceps strength on the left, he has 4 -/5 triceps weakness on the left, 3/5 biceps and triceps weakness on the right, weakness with intrinsic muscles on the right hand.  With the legs, he has bilateral hip flexion weakness, slight weakness with extension of the knees bilaterally.  He has prominent foot drops on the left greater than right side.  Sensory examination: Soft touch sensation is symmetric on the face, arms, and legs.  Coordination: The patient has good finger-nose-finger and heel-to-shin bilaterally.  Gait and station: The patient has a wide-based gait, he walks with a walker.  Tandem gait is not attempted.  Reflexes: Deep tendon reflexes  are symmetric, but are somewhat brisk in the legs.   Assessment/Plan:  1.  ALS  2.  Gait disturbance  The patient has had progression of weakness in the legs and in the right arm in particular.  He appears to be progressing at a somewhat rapid rate.  The patient will continue to be followed through [facility], he will follow-up here in 4 months.  We will need to monitor the swallowing and breathing over time.  Jill Alexanders MD 05/06/2019 2:08 PM  [facility] [address] [place], [place] [address]  Phone [phone] Fax [phone]

## 2019-05-11 DIAGNOSIS — M545 Low back pain: Secondary | ICD-10-CM | POA: Diagnosis not present

## 2019-06-02 DIAGNOSIS — M545 Low back pain: Secondary | ICD-10-CM | POA: Diagnosis not present

## 2019-06-15 NOTE — Progress Notes (Signed)
Subjective:   Jason Tate is a 74 y.o. male who presents for Medicare Annual/Subsequent preventive examination.  Review of Systems:  N/A  Cardiac Risk Factors include: advanced age (>52men, >73 women);dyslipidemia;male gender;sedentary lifestyle     Objective:    Vitals: There were no vitals taken for this visit.  There is no height or weight on file to calculate BMI.  Advanced Directives 06/16/2019 06/10/2018 06/06/2017 06/05/2016 08/01/2015  Does Patient Have a Medical Advance Directive? Yes Yes Yes Yes No  Type of Estate agent of [place];Living will Healthcare Power of [facility] [place];Living will Healthcare Power of [place];Living will Living will;Healthcare Power of Attorney -  Copy of Healthcare Power of Attorney in Chart? No - copy requested No - copy requested No - copy requested No - copy requested -    Tobacco Social History   Tobacco Use  Smoking Status Former Smoker  . Packs/day: 1.00  . Years: 20.00  . Pack years: 20.00  . Types: Cigarettes  . Quit date: 03/28/1980  . Years since quitting: 39.2  Smokeless Tobacco Never Used     Counseling given: Not Answered   Clinical Intake:  Pre-visit preparation completed: Yes  Pain : 0-10 Pain Score: 2  Pain Type: Acute pain Pain Location: Elbow Pain Orientation: Right Pain Descriptors / Indicators: Aching Pain Onset: In the past 7 days Pain Frequency: Intermittent     Nutritional Risks: None Diabetes: No  How often do you need to have someone help you when you read instructions, pamphlets, or other written materials from your doctor or pharmacy?: 1 - Never  Interpreter Needed?: No  Information entered by :: [facility], LPN  Past Medical History:  Diagnosis Date  . Abnormal stress echo Oct 2012  . 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 Arthritis   . CAD (coronary artery disease) October 2012   s/p CABG x 2 per Dr. Cornelius Moras  . Gait abnormality 12/25/2018  .  Hypercholesterolemia   . Low testosterone   . Macular degeneration   . 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 Obesity   . PVC (premature ventricular contraction)   . Sleep apnea, obstructive    on BiPAP   Past Surgical History:  Procedure Laterality Date  . COLONOSCOPY    . CORONARY ARTERY BYPASS GRAFT  Oct 2012   LIMA to LAD and left radial to OM  . HEMORRHOIDECTOMY WITH HEMORRHOID BANDING    . INJECTION KNEE     Family History  Problem Relation Age of Onset  . Heart disease Mother   . Stroke Father   . Heart disease Father   . Dementia Father    Social History   Socioeconomic History  . Marital status: Married    Spouse name: Not on file  . Number of children: 2  . Years of education: Not on file  . Highest education level: Bachelor's degree (e.g., BA, AB, BS)  Occupational History  . Occupation: Designer, television/film set    Comment: retired  Tobacco Use  . Smoking status: Former Smoker    Packs/day: 1.00    Years: 20.00    Pack years: 20.00    Types: Cigarettes    Quit date: 03/28/1980    Years since quitting: 39.2  . Smokeless tobacco: Never Used  Substance and Sexual Activity  . Alcohol use: Yes    Alcohol/week: 28.0 standard drinks    Types: 28 Cans of beer per week    Comment: 4 beers a  night  . Drug use: No  . Sexual activity: Yes  Other Topics Concern  . Not on file  Social History Narrative   I son deceased- sucicide   2 cups of coffee a day.   Right handed.   Social Determinants of Health   Financial Resource Strain: Low Risk   . Difficulty of Paying Living Expenses: Not hard at all  Food Insecurity: No Food Insecurity  . Worried About Charity fundraiser in the Last Year: Never true  . Ran Out of Food in the Last Year: Never true  Transportation Needs: No Transportation Needs  . Lack of Transportation (Medical): No  . Lack of Transportation (Non-Medical): No  Physical Activity: Inactive  . Days of Exercise per Week: 0  days  . Minutes of Exercise per Session: 0 min  Stress: No Stress Concern Present  . Feeling of Stress : Only a little  Social Connections: Slightly Isolated  . Frequency of Communication with Friends and Family: More than three times a week  . Frequency of Social Gatherings with Friends and Family: Twice a week  . Attends Religious Services: More than 4 times per year  . Active Member of Clubs or Organizations: No  . Attends Archivist Meetings: Never  . Marital Status: Married    Outpatient Encounter Medications as of 06/16/2019  Medication Sig  . allopurinol (ZYLOPRIM) 100 MG tablet Take 1 tablet (100 mg total) by mouth daily.  Marland Kitchen aspirin 81 MG tablet Take 81 mg by mouth daily.  . B Complex Vitamins (VITAMIN B COMPLEX PO) Take 1 tablet by mouth daily.   Marland Kitchen buPROPion (WELLBUTRIN XL) 300 MG 24 hr tablet Take 1 tablet (300 mg total) by mouth daily.  . Cetirizine HCl (ZYRTEC ALLERGY PO) Take 1 tablet by mouth daily as needed (allergies).   . Cholecalciferol (VITAMIN D PO) Take 4,000 mg by mouth daily.    . citalopram (CELEXA) 20 MG tablet Take 1 tablet (20 mg total) by mouth daily.  . clonazePAM (KLONOPIN) 1 MG tablet Take 1 tablet (1 mg total) by mouth at bedtime as needed. for sleep  . Copper 5 MG TABS Take 5 mg by mouth daily.  Marland Kitchen ibuprofen (ADVIL) 200 MG tablet Take 400 mg by mouth at bedtime as needed.  . NON FORMULARY TUDCA 1 gm 2 per day  . Omega-3 Fatty Acids (FISH OIL PO) Take 1 tablet by mouth daily.   Marland Kitchen omeprazole (PRILOSEC) 20 MG capsule Take 1 capsule (20 mg total) by mouth daily.  . riluzole (RILUTEK) 50 MG tablet Take 50 mg by mouth every 12 (twelve) hours.  . sildenafil (REVATIO) 20 MG tablet 1 - 5 PO daily as needed  . simvastatin (ZOCOR) 20 MG tablet Take 1 tablet (20 mg total) by mouth at bedtime.  . fluticasone (FLONASE) 50 MCG/ACT nasal spray Place 1 spray into both nostrils daily as needed for allergies. (Patient not taking: Reported on 06/16/2019)  .  nabumetone (RELAFEN) 500 MG tablet Take 1 tablet (500 mg total) by mouth 2 (two) times daily as needed. (Patient not taking: Reported on 06/16/2019)   No facility-administered encounter medications on file as of 06/16/2019.    Activities of Daily Living In your present state of health, do you have any difficulty performing the following activities: 06/16/2019  Hearing? N  Vision? Y  Comment Due to MD.  Difficulty concentrating or making decisions? N  Walking or climbing stairs? Y  Comment Avoids steps completely due to  ALS.  Dressing or bathing? N  Doing errands, shopping? Y  Comment Does not drive.  Preparing Food and eating ? N  Using the Toilet? N  In the past six months, have you accidently leaked urine? Y  Comment Occasionally  Do you have problems with loss of bowel control? N  Managing your Medications? N  Managing your Finances? Y  Comment Wife manages finances.  Housekeeping or managing your Housekeeping? Y  Comment Wife does all the housework.  Some recent data might be hidden    Patient Care Team: [name]., MD as PCP - General (Unknown Physician Specialty) [place], Peter M, MD (Cardiology) Maris Berger, MD as Consulting Physician (Ophthalmology) Sharrell Ku, MD as Consulting Physician (Gastroenterology) York Spaniel, MD as Consulting Physician (Neurology) Cynda Familia, MD as Referring Physician (Neurology) Yvonna Alanis, MD (Neurology)   Assessment:   This is a routine wellness examination for [facility] [place].  Exercise Activities and Dietary recommendations Current Exercise Habits: The patient does not participate in regular exercise at present, Exercise limited by: Other - see comments(Due to ALS.)  Goals    . DIET - INCREASE WATER INTAKE     Recommend to drink at least 6-8 8oz glasses of water per day.       Fall Risk: Fall Risk  06/16/2019 06/10/2018 06/06/2017 06/05/2016 04/18/2015  Falls in the past year? 1 1 Yes No No  Comment - -  tripped - -  Number falls in past yr: 1 1 1  - -  Injury with Fall? 1 0 Yes - -  Comment - - tore cartiledge in right knee - -  Risk for fall due to : Impaired balance/gait;Impaired mobility;Other (Comment) - - - -  Risk for fall due to: Comment Due to ALS - - - -  Follow up Falls prevention discussed Falls prevention discussed Falls prevention discussed - -    FALL RISK PREVENTION PERTAINING TO THE HOME:  Any stairs in or around the home? No  If so, are there any without handrails? N/A  Home free of loose throw rugs in walkways, pet beds, electrical cords, etc? Yes  Adequate lighting in your home to reduce risk of falls? Yes   ASSISTIVE DEVICES UTILIZED TO PREVENT FALLS:  Life alert? No  Use of a cane, walker or w/c? Yes  Grab bars in the bathroom? Yes  Shower chair or bench in shower? Yes  Elevated toilet seat or a handicapped toilet? Yes   TIMED UP AND GO:  Was the test performed? No .    Depression Screen PHQ 2/9 Scores 06/16/2019 06/16/2019 06/10/2018 06/06/2017  PHQ - 2 Score 1 1 0 4  PHQ- 9 Score - - - 6    Cognitive Function: Declined today.      6CIT Screen 06/05/2016  What Year? 0 points  What month? 0 points  What time? 0 points  Count back from 20 0 points  Months in reverse 0 points  Repeat phrase 0 points  Total Score 0    Immunization History  Administered Date(s) Administered  . Fluad Quad(high Dose 65+) 03/03/2019  . Influenza, High Dose Seasonal PF 03/04/2017  . Influenza, Seasonal, Injecte, Preservative Fre 03/23/2015  . Influenza,inj,quad, With Preservative 03/13/2018  . Influenza-Unspecified 03/23/2016  . Pneumococcal Conjugate-13 06/05/2016  . Pneumococcal Polysaccharide-23 06/03/2012  . Td 09/01/2009  . Zoster 08/02/2005    Qualifies for Shingles Vaccine? Yes  Zostavax completed 08/02/05. Due for Shingrix. Pt has been advised to call insurance company  to determine out of pocket expense. Advised may also receive vaccine at local pharmacy or  Health Dept. Verbalized acceptance and understanding.  Tdap: Up to date  Flu Vaccine: Up to date  Pneumococcal Vaccine: Completed series  Screening Tests Health Maintenance  Topic Date Due  . Hepatitis C Screening  May 02, 1946  . TETANUS/TDAP  09/02/2019  . COLONOSCOPY  12/07/2020  . INFLUENZA VACCINE  Completed  . PNA vac Low Risk Adult  Completed   Cancer Screenings:  Colorectal Screening: Completed 12/08/15. Repeat every 5 years.   Lung Cancer Screening: (Low Dose CT Chest recommended if Age 60-80 years, 30 pack-year currently smoking OR have quit w/in 15years.) does not qualify.   Additional Screening:  Hepatitis C Screening: does qualify and would like this added to next blood work orders.   Vision Screening: Recommended annual ophthalmology exams for early detection of glaucoma and other disorders of the eye.  Dental Screening: Recommended annual dental exams for proper oral hygiene  Community Resource Referral:  CRR required this visit?  No        Plan:  I have personally reviewed and addressed the Medicare Annual Wellness questionnaire and have noted the following in the patient's chart:  A. Medical and social history B. Use of alcohol, tobacco or illicit drugs  C. Current medications and supplements D. Functional ability and status E.  Nutritional status F.  Physical activity G. Advance directives H. List of other physicians I.  Hospitalizations, surgeries, and ER visits in previous 12 months J.  Vitals K. Screenings such as hearing and vision if needed, cognitive and depression L. Referrals and appointments   In addition, I have reviewed and discussed with patient certain preventive protocols, quality metrics, and best practice recommendations. A written personalized care plan for preventive services as well as general preventive health recommendations were provided to patient.   Darrick Huntsman, [place]  6/60/6301 Nurse Health  Advisor   Nurse Notes: Pt would like the Hep C lab added to next blood work orders.

## 2019-06-16 ENCOUNTER — Other Ambulatory Visit: Payer: Self-pay

## 2019-06-16 ENCOUNTER — Encounter: Payer: Medicare Other | Admitting: Family Medicine

## 2019-06-16 ENCOUNTER — Ambulatory Visit (INDEPENDENT_AMBULATORY_CARE_PROVIDER_SITE_OTHER): Payer: Medicare Other

## 2019-06-16 DIAGNOSIS — Z0000 Encounter for general adult medical examination without abnormal findings: Secondary | ICD-10-CM

## 2019-06-16 NOTE — Patient Instructions (Signed)
Jason Tate , Thank you for taking time to come for your Medicare Wellness Visit. I appreciate your ongoing commitment to your health goals. Please review the following plan we discussed and let me know if I can assist you in the future.   Screening recommendations/referrals: Colonoscopy: Up to date, due 11/2020 Recommended yearly ophthalmology/optometry visit for glaucoma screening and checkup Recommended yearly dental visit for hygiene and checkup  Vaccinations: Influenza vaccine: Up to date Pneumococcal vaccine: Completed series Tdap vaccine: Up to date, due 08/2019 Shingles vaccine: Pt declines today.     Advanced directives: Please bring a copy of your POA (Power of Attorney) and/or Living Will to your next appointment.   Conditions/risks identified: Recommend to increase water intake to 6-8 8 oz glasses a day.   Next appointment: 08/11/19 @ 8:40 AM with Dr Sullivan Lone. Declined scheduling an AWV for 2022 at this time.  Preventive Care 77 Years and Older, Male Preventive care refers to lifestyle choices and visits with your health care provider that can promote health and wellness. What does preventive care include?  A yearly physical exam. This is also called an annual well check.  Dental exams once or twice a year.  Routine eye exams. Ask your health care provider how often you should have your eyes checked.  Personal lifestyle choices, including:  Daily care of your teeth and gums.  Regular physical activity.  Eating a healthy diet.  Avoiding tobacco and drug use.  Limiting alcohol use.  Practicing safe sex.  Taking low doses of aspirin every day.  Taking vitamin and mineral supplements as recommended by your health care provider. What happens during an annual well check? The services and screenings done by your health care provider during your annual well check will depend on your age, overall health, lifestyle risk factors, and family history of disease. Counseling    Your health care provider may ask you questions about your:  Alcohol use.  Tobacco use.  Drug use.  Emotional well-being.  Home and relationship well-being.  Sexual activity.  Eating habits.  History of falls.  Memory and ability to understand (cognition).  Work and work Astronomer. Screening  You may have the following tests or measurements:  Height, weight, and BMI.  Blood pressure.  Lipid and cholesterol levels. These may be checked every 5 years, or more frequently if you are over 12 years old.  Skin check.  Lung cancer screening. You may have this screening every year starting at age 59 if you have a 30-pack-year history of smoking and currently smoke or have quit within the past 15 years.  Fecal occult blood test (FOBT) of the stool. You may have this test every year starting at age 11.  Flexible sigmoidoscopy or colonoscopy. You may have a sigmoidoscopy every 5 years or a colonoscopy every 10 years starting at age 18.  Prostate cancer screening. Recommendations will vary depending on your family history and other risks.  Hepatitis C blood test.  Hepatitis B blood test.  Sexually transmitted disease (STD) testing.  Diabetes screening. This is done by checking your blood sugar (glucose) after you have not eaten for a while (fasting). You may have this done every 1-3 years.  Abdominal aortic aneurysm (AAA) screening. You may need this if you are a current or former smoker.  Osteoporosis. You may be screened starting at age 76 if you are at high risk. Talk with your health care provider about your test results, treatment options, and if necessary, the need  for more tests. Vaccines  Your health care provider may recommend certain vaccines, such as:  Influenza vaccine. This is recommended every year.  Tetanus, diphtheria, and acellular pertussis (Tdap, Td) vaccine. You may need a Td booster every 10 years.  Zoster vaccine. You may need this after age  60.  Pneumococcal 13-valent conjugate (PCV13) vaccine. One dose is recommended after age 25.  Pneumococcal polysaccharide (PPSV23) vaccine. One dose is recommended after age 71. Talk to your health care provider about which screenings and vaccines you need and how often you need them. This information is not intended to replace advice given to you by your health care provider. Make sure you discuss any questions you have with your health care provider. Document Released: 06/10/2015 Document Revised: 02/01/2016 Document Reviewed: 03/15/2015 Elsevier Interactive Patient Education  2017 [place] Prevention in the Home Falls can cause injuries. They can happen to people of all ages. There are many things you can do to make your home safe and to help prevent falls. What can I do on the outside of my home?  Regularly fix the edges of walkways and driveways and fix any cracks.  Remove anything that might make you trip as you walk through a door, such as a raised step or threshold.  Trim any bushes or trees on the path to your home.  Use bright outdoor lighting.  Clear any walking paths of anything that might make someone trip, such as rocks or tools.  Regularly check to see if handrails are loose or broken. Make sure that both sides of any steps have handrails.  Any raised decks and porches should have guardrails on the edges.  Have any leaves, snow, or ice cleared regularly.  Use sand or salt on walking paths during winter.  Clean up any spills in your garage right away. This includes oil or grease spills. What can I do in the bathroom?  Use night lights.  Install grab bars by the toilet and in the tub and shower. Do not use towel bars as grab bars.  Use non-skid mats or decals in the tub or shower.  If you need to sit down in the shower, use a plastic, non-slip stool.  Keep the floor dry. Clean up any water that spills on the floor as soon as it happens.  Remove  soap buildup in the tub or shower regularly.  Attach bath mats securely with double-sided non-slip rug tape.  Do not have throw rugs and other things on the floor that can make you trip. What can I do in the bedroom?  Use night lights.  Make sure that you have a light by your bed that is easy to reach.  Do not use any sheets or blankets that are too big for your bed. They should not hang down onto the floor.  Have a firm chair that has side arms. You can use this for support while you get dressed.  Do not have throw rugs and other things on the floor that can make you trip. What can I do in the kitchen?  Clean up any spills right away.  Avoid walking on wet floors.  Keep items that you use a lot in easy-to-reach places.  If you need to reach something above you, use a strong step stool that has a grab bar.  Keep electrical cords out of the way.  Do not use floor polish or wax that makes floors slippery. If you must use wax, use  non-skid floor wax.  Do not have throw rugs and other things on the floor that can make you trip. What can I do with my stairs?  Do not leave any items on the stairs.  Make sure that there are handrails on both sides of the stairs and use them. Fix handrails that are broken or loose. Make sure that handrails are as long as the stairways.  Check any carpeting to make sure that it is firmly attached to the stairs. Fix any carpet that is loose or worn.  Avoid having throw rugs at the top or bottom of the stairs. If you do have throw rugs, attach them to the floor with carpet tape.  Make sure that you have a light switch at the top of the stairs and the bottom of the stairs. If you do not have them, ask someone to add them for you. What else can I do to help prevent falls?  Wear shoes that:  Do not have high heels.  Have rubber bottoms.  Are comfortable and fit you well.  Are closed at the toe. Do not wear sandals.  If you use a  stepladder:  Make sure that it is fully opened. Do not climb a closed stepladder.  Make sure that both sides of the stepladder are locked into place.  Ask someone to hold it for you, if possible.  Clearly mark and make sure that you can see:  Any grab bars or handrails.  First and last steps.  Where the edge of each step is.  Use tools that help you move around (mobility aids) if they are needed. These include:  Canes.  Walkers.  Scooters.  Crutches.  Turn on the lights when you go into a dark area. Replace any light bulbs as soon as they burn out.  Set up your furniture so you have a clear path. Avoid moving your furniture around.  If any of your floors are uneven, fix them.  If there are any pets around you, be aware of where they are.  Review your medicines with your doctor. Some medicines can make you feel dizzy. This can increase your chance of falling. Ask your doctor what other things that you can do to help prevent falls. This information is not intended to replace advice given to you by your health care provider. Make sure you discuss any questions you have with your health care provider. Document Released: 03/10/2009 Document Revised: 10/20/2015 Document Reviewed: 06/18/2014 Elsevier Interactive Patient Education  2017 [employer].

## 2019-07-07 DIAGNOSIS — Z961 Presence of intraocular lens: Secondary | ICD-10-CM | POA: Diagnosis not present

## 2019-07-10 NOTE — Progress Notes (Signed)
Patient: Jason Tate Male    DOB: 15-May-1946   74 y.o.   MRN: [id number] Visit Date: 07/13/2019  Today's Provider: Megan Mans, MD   Chief Complaint  Patient presents with  . Hyperlipidemia   Subjective:     HPI  Patient and his wife have now moved to a single-story home in the [place].  They are in the process of getting their old home ready to sell.  He is followed by an ALS specialist neurologist at [facility].  He is in a  study.  He continues to get weaker.  He has very limited use of his right arm now.  Has fallen several times.  He walks with a walker.  Occasions are unchanged and he wishes to make no changes presently Hypercholesterolemia  From 06/10/2018-labs checked showing-stable.  ALS (amyotrophic lateral sclerosis) (HCC) From 02/04/2019-Neurology following. Might start cutting back on some meds as this progresses.   Coronary artery disease involving coronary bypass graft of native heart without angina pectoris From 02/04/2019-no changes were made.   Allergies  Allergen Reactions  . Penicillins Nausea Only     Current Outpatient Medications:  .  allopurinol (ZYLOPRIM) 100 MG tablet, Take 1 tablet (100 mg total) by mouth daily., Disp: 90 tablet, Rfl: 3 .  aspirin 81 MG tablet, Take 81 mg by mouth daily., Disp: , Rfl:  .  B Complex Vitamins (VITAMIN B COMPLEX PO), Take 1 tablet by mouth daily. , Disp: , Rfl:  .  buPROPion (WELLBUTRIN XL) 300 MG 24 hr tablet, Take 1 tablet (300 mg total) by mouth daily., Disp: 90 tablet, Rfl: 3 .  Cetirizine HCl (ZYRTEC ALLERGY PO), Take 1 tablet by mouth daily as needed (allergies). , Disp: , Rfl:  .  Cholecalciferol (VITAMIN D PO), Take 4,000 mg by mouth daily.  , Disp: , Rfl:  .  citalopram (CELEXA) 20 MG tablet, Take 1 tablet (20 mg total) by mouth daily., Disp: 90 tablet, Rfl: 3 .  clonazePAM (KLONOPIN) 1 MG tablet, Take 1 tablet (1 mg total) by mouth at bedtime as needed. for sleep, Disp:  90 tablet, Rfl: 1 .  Copper 5 MG TABS, Take 5 mg by mouth daily., Disp: , Rfl:  .  fluticasone (FLONASE) 50 MCG/ACT nasal spray, Place 1 spray into both nostrils daily as needed for allergies., Disp: 16 g, Rfl: 3 .  ibuprofen (ADVIL) 200 MG tablet, Take 400 mg by mouth at bedtime as needed., Disp: , Rfl:  .  nabumetone (RELAFEN) 750 MG tablet, Take 750 mg by mouth daily., Disp: , Rfl:  .  NON FORMULARY, TUDCA 1 gm 2 per day, Disp: , Rfl:  .  Omega-3 Fatty Acids (FISH OIL PO), Take 1 tablet by mouth daily. , Disp: , Rfl:  .  omeprazole (PRILOSEC) 20 MG capsule, Take 1 capsule (20 mg total) by mouth daily., Disp: 90 capsule, Rfl: 3 .  riluzole (RILUTEK) 50 MG tablet, Take 50 mg by mouth every 12 (twelve) hours., Disp: , Rfl:  .  sildenafil (REVATIO) 20 MG tablet, 1 - 5 PO daily as needed, Disp: 50 tablet, Rfl: 11 .  simvastatin (ZOCOR) 20 MG tablet, Take 1 tablet (20 mg total) by mouth at bedtime., Disp: 90 tablet, Rfl: 3 .  nabumetone (RELAFEN) 500 MG tablet, Take 1 tablet (500 mg total) by mouth 2 (two) times daily as needed. (Patient not taking: Reported on 06/16/2019), Disp: 180 tablet, Rfl: 3  Review of Systems  Constitutional: Negative for appetite change, chills and fever.  HENT: Negative.   Eyes: Positive for visual disturbance.  Respiratory: Negative for chest tightness, shortness of breath and wheezing.   Cardiovascular: Negative for chest pain and palpitations.  Gastrointestinal: Negative for abdominal pain, nausea and vomiting.  Endocrine: Negative.   Musculoskeletal: Positive for gait problem.  Allergic/Immunologic: Negative.   Neurological: Positive for weakness.  Psychiatric/Behavioral: Negative.     Social History   Tobacco Use  . Smoking status: Former Smoker    Packs/day: 1.00    Years: 20.00    Pack years: 20.00    Types: Cigarettes    Quit date: 03/28/1980    Years since quitting: 39.3  . Smokeless tobacco: Never Used  Substance Use Topics  . Alcohol use: Yes     Alcohol/week: 28.0 standard drinks    Types: 28 Cans of beer per week    Comment: 4 beers a night      Objective:   BP 137/80 (BP Location: Right Arm, Patient Position: Sitting, Cuff Size: Normal)   Pulse 76   Temp (!) 97.1 F (36.2 C) (Temporal)   Wt 187 lb 3.2 oz (84.9 kg)   SpO2 98%   BMI 25.39 kg/m  Vitals:   07/13/19 1137  BP: 137/80  Pulse: 76  Temp: (!) 97.1 F (36.2 C)  TempSrc: Temporal  SpO2: 98%  Weight: 187 lb 3.2 oz (84.9 kg)  Body mass index is 25.39 kg/m.   Physical Exam Vitals reviewed.  Constitutional:      Comments: He appears disheveled as he has not had a haircut or a shave in probably over a year  HENT:     Head: Normocephalic and atraumatic.     Right Ear: External ear normal.     Left Ear: External ear normal.  Eyes:     General: No scleral icterus. Cardiovascular:     Rate and Rhythm: Normal rate and regular rhythm.     Pulses: Normal pulses.     Heart sounds: Normal heart sounds.  Pulmonary:     Effort: Pulmonary effort is normal.     Breath sounds: Normal breath sounds.  Neurological:     Mental Status: He is alert and oriented to person, place, and time.     Comments: He has obvious significant weakness of his right arm and hand.  Left arm and hand is weak but not as much.  Not attempt to walk the patient.  Psychiatric:        Mood and Affect: Mood normal.        Behavior: Behavior normal.        Thought Content: Thought content normal.        Judgment: Judgment normal.      No results found for any visits on 07/13/19.     Assessment & Plan     1. ALS (amyotrophic lateral sclerosis) (HCC) Progressive, patient is hoping one of the studies he can get him at [facility] will be helpful.  This is a terminal process.  I will plan to see him in 3 months but not sure that will happen.  2. Gout of knee, unspecified cause, unspecified chronicity, unspecified laterality  - allopurinol (ZYLOPRIM) 100 MG tablet; Take 1 tablet (100 mg total)  by mouth daily.  Dispense: 90 tablet; Refill: 3  3. Depression, unspecified depression type  - buPROPion (WELLBUTRIN XL) 300 MG 24 hr tablet; Take 1 tablet (300 mg total) by mouth daily.  Dispense: 90 tablet; Refill: 3 - citalopram (CELEXA) 20 MG tablet; Take 1 tablet (20 mg total) by mouth daily.  Dispense: 90 tablet; Refill: 3  4. Other insomnia Due to medications. - clonazePAM (KLONOPIN) 1 MG tablet; Take 1 tablet (1 mg total) by mouth at bedtime as needed. for sleep  Dispense: 90 tablet; Refill: 1  5. Coronary artery disease involving coronary bypass graft of native heart without angina pectoris  - simvastatin (ZOCOR) 20 MG tablet; Take 1 tablet (20 mg total) by mouth at bedtime.  Dispense: 90 tablet; Refill: 3  6. S/P CABG x 2   7. Hypercholesterolemia Stopping Zocor today but patient wishes to not stop any medications at this time. Also consider stopping omeprazole. 8. OSA (obstructive sleep apnea)   I, Porsha McClurkin CMA, am acting as a scribe for [name]., MD.       [name], MD  [facility]

## 2019-07-13 ENCOUNTER — Ambulatory Visit (INDEPENDENT_AMBULATORY_CARE_PROVIDER_SITE_OTHER): Payer: Medicare Other | Admitting: Family Medicine

## 2019-07-13 ENCOUNTER — Other Ambulatory Visit: Payer: Self-pay

## 2019-07-13 ENCOUNTER — Encounter: Payer: Self-pay | Admitting: Family Medicine

## 2019-07-13 VITALS — BP 137/80 | HR 76 | Temp 97.1°F | Wt 187.2 lb

## 2019-07-13 DIAGNOSIS — F32A Depression, unspecified: Secondary | ICD-10-CM

## 2019-07-13 DIAGNOSIS — F329 Major depressive disorder, single episode, unspecified: Secondary | ICD-10-CM | POA: Diagnosis not present

## 2019-07-13 DIAGNOSIS — G4709 Other insomnia: Secondary | ICD-10-CM

## 2019-07-13 DIAGNOSIS — G4733 Obstructive sleep apnea (adult) (pediatric): Secondary | ICD-10-CM

## 2019-07-13 DIAGNOSIS — Z951 Presence of aortocoronary bypass graft: Secondary | ICD-10-CM | POA: Diagnosis not present

## 2019-07-13 DIAGNOSIS — M109 Gout, unspecified: Secondary | ICD-10-CM | POA: Diagnosis not present

## 2019-07-13 DIAGNOSIS — I25810 Atherosclerosis of coronary artery bypass graft(s) without angina pectoris: Secondary | ICD-10-CM | POA: Diagnosis not present

## 2019-07-13 DIAGNOSIS — E7800 Pure hypercholesterolemia, unspecified: Secondary | ICD-10-CM | POA: Diagnosis not present

## 2019-07-13 DIAGNOSIS — G1221 Amyotrophic lateral sclerosis: Secondary | ICD-10-CM | POA: Diagnosis not present

## 2019-07-14 MED ORDER — CITALOPRAM HYDROBROMIDE 20 MG PO TABS: 20.0000 mg | ORAL_TABLET | Freq: Every day | ORAL | 3 refills | Status: AC

## 2019-07-14 MED ORDER — CLONAZEPAM 1 MG PO TABS: 1.0000 mg | ORAL_TABLET | Freq: Every evening | ORAL | 1 refills | Status: DC | PRN

## 2019-07-14 MED ORDER — BUPROPION HCL ER (XL) 300 MG PO TB24: 300.0000 mg | ORAL_TABLET | Freq: Every day | ORAL | 3 refills | Status: AC

## 2019-07-14 MED ORDER — SIMVASTATIN 20 MG PO TABS: 20.0000 mg | ORAL_TABLET | Freq: Every day | ORAL | 3 refills | Status: AC

## 2019-07-14 MED ORDER — ALLOPURINOL 100 MG PO TABS: 100.0000 mg | ORAL_TABLET | Freq: Every day | ORAL | 3 refills | Status: AC

## 2019-07-21 DIAGNOSIS — R296 Repeated falls: Secondary | ICD-10-CM | POA: Diagnosis not present

## 2019-07-21 DIAGNOSIS — Z5181 Encounter for therapeutic drug level monitoring: Secondary | ICD-10-CM | POA: Diagnosis not present

## 2019-07-21 DIAGNOSIS — Z79899 Other long term (current) drug therapy: Secondary | ICD-10-CM | POA: Diagnosis not present

## 2019-07-21 DIAGNOSIS — G1221 Amyotrophic lateral sclerosis: Secondary | ICD-10-CM | POA: Diagnosis not present

## 2019-07-31 DIAGNOSIS — G4730 Sleep apnea, unspecified: Secondary | ICD-10-CM | POA: Diagnosis not present

## 2019-07-31 DIAGNOSIS — G1221 Amyotrophic lateral sclerosis: Secondary | ICD-10-CM | POA: Diagnosis not present

## 2019-07-31 DIAGNOSIS — I441 Atrioventricular block, second degree: Secondary | ICD-10-CM | POA: Diagnosis not present

## 2019-07-31 DIAGNOSIS — I2510 Atherosclerotic heart disease of native coronary artery without angina pectoris: Secondary | ICD-10-CM | POA: Diagnosis not present

## 2019-07-31 DIAGNOSIS — Z9181 History of falling: Secondary | ICD-10-CM | POA: Diagnosis not present

## 2019-07-31 DIAGNOSIS — E785 Hyperlipidemia, unspecified: Secondary | ICD-10-CM | POA: Diagnosis not present

## 2019-07-31 DIAGNOSIS — H3530 Unspecified macular degeneration: Secondary | ICD-10-CM | POA: Diagnosis not present

## 2019-07-31 DIAGNOSIS — M1990 Unspecified osteoarthritis, unspecified site: Secondary | ICD-10-CM | POA: Diagnosis not present

## 2019-08-04 DIAGNOSIS — I2510 Atherosclerotic heart disease of native coronary artery without angina pectoris: Secondary | ICD-10-CM | POA: Diagnosis not present

## 2019-08-04 DIAGNOSIS — G1221 Amyotrophic lateral sclerosis: Secondary | ICD-10-CM | POA: Diagnosis not present

## 2019-08-04 DIAGNOSIS — H3530 Unspecified macular degeneration: Secondary | ICD-10-CM | POA: Diagnosis not present

## 2019-08-04 DIAGNOSIS — E785 Hyperlipidemia, unspecified: Secondary | ICD-10-CM | POA: Diagnosis not present

## 2019-08-04 DIAGNOSIS — M1990 Unspecified osteoarthritis, unspecified site: Secondary | ICD-10-CM | POA: Diagnosis not present

## 2019-08-04 DIAGNOSIS — G4730 Sleep apnea, unspecified: Secondary | ICD-10-CM | POA: Diagnosis not present

## 2019-08-05 DIAGNOSIS — G4730 Sleep apnea, unspecified: Secondary | ICD-10-CM | POA: Diagnosis not present

## 2019-08-05 DIAGNOSIS — I2510 Atherosclerotic heart disease of native coronary artery without angina pectoris: Secondary | ICD-10-CM | POA: Diagnosis not present

## 2019-08-05 DIAGNOSIS — E785 Hyperlipidemia, unspecified: Secondary | ICD-10-CM | POA: Diagnosis not present

## 2019-08-05 DIAGNOSIS — M1990 Unspecified osteoarthritis, unspecified site: Secondary | ICD-10-CM | POA: Diagnosis not present

## 2019-08-05 DIAGNOSIS — H3530 Unspecified macular degeneration: Secondary | ICD-10-CM | POA: Diagnosis not present

## 2019-08-05 DIAGNOSIS — G1221 Amyotrophic lateral sclerosis: Secondary | ICD-10-CM | POA: Diagnosis not present

## 2019-08-07 DIAGNOSIS — H3530 Unspecified macular degeneration: Secondary | ICD-10-CM | POA: Diagnosis not present

## 2019-08-07 DIAGNOSIS — I2510 Atherosclerotic heart disease of native coronary artery without angina pectoris: Secondary | ICD-10-CM | POA: Diagnosis not present

## 2019-08-07 DIAGNOSIS — M1990 Unspecified osteoarthritis, unspecified site: Secondary | ICD-10-CM | POA: Diagnosis not present

## 2019-08-07 DIAGNOSIS — G1221 Amyotrophic lateral sclerosis: Secondary | ICD-10-CM | POA: Diagnosis not present

## 2019-08-07 DIAGNOSIS — G4730 Sleep apnea, unspecified: Secondary | ICD-10-CM | POA: Diagnosis not present

## 2019-08-07 DIAGNOSIS — E785 Hyperlipidemia, unspecified: Secondary | ICD-10-CM | POA: Diagnosis not present

## 2019-08-10 DIAGNOSIS — E785 Hyperlipidemia, unspecified: Secondary | ICD-10-CM | POA: Diagnosis not present

## 2019-08-10 DIAGNOSIS — G1221 Amyotrophic lateral sclerosis: Secondary | ICD-10-CM | POA: Diagnosis not present

## 2019-08-10 DIAGNOSIS — H3530 Unspecified macular degeneration: Secondary | ICD-10-CM | POA: Diagnosis not present

## 2019-08-10 DIAGNOSIS — M1990 Unspecified osteoarthritis, unspecified site: Secondary | ICD-10-CM | POA: Diagnosis not present

## 2019-08-10 DIAGNOSIS — G4730 Sleep apnea, unspecified: Secondary | ICD-10-CM | POA: Diagnosis not present

## 2019-08-10 DIAGNOSIS — I2510 Atherosclerotic heart disease of native coronary artery without angina pectoris: Secondary | ICD-10-CM | POA: Diagnosis not present

## 2019-08-11 ENCOUNTER — Encounter: Payer: Medicare Other | Admitting: Family Medicine

## 2019-08-12 DIAGNOSIS — G1221 Amyotrophic lateral sclerosis: Secondary | ICD-10-CM | POA: Diagnosis not present

## 2019-08-12 DIAGNOSIS — H3530 Unspecified macular degeneration: Secondary | ICD-10-CM | POA: Diagnosis not present

## 2019-08-12 DIAGNOSIS — M1990 Unspecified osteoarthritis, unspecified site: Secondary | ICD-10-CM | POA: Diagnosis not present

## 2019-08-12 DIAGNOSIS — I2510 Atherosclerotic heart disease of native coronary artery without angina pectoris: Secondary | ICD-10-CM | POA: Diagnosis not present

## 2019-08-12 DIAGNOSIS — G4730 Sleep apnea, unspecified: Secondary | ICD-10-CM | POA: Diagnosis not present

## 2019-08-12 DIAGNOSIS — E785 Hyperlipidemia, unspecified: Secondary | ICD-10-CM | POA: Diagnosis not present

## 2019-08-13 DIAGNOSIS — E785 Hyperlipidemia, unspecified: Secondary | ICD-10-CM | POA: Diagnosis not present

## 2019-08-13 DIAGNOSIS — I2510 Atherosclerotic heart disease of native coronary artery without angina pectoris: Secondary | ICD-10-CM | POA: Diagnosis not present

## 2019-08-13 DIAGNOSIS — G4730 Sleep apnea, unspecified: Secondary | ICD-10-CM | POA: Diagnosis not present

## 2019-08-13 DIAGNOSIS — M1990 Unspecified osteoarthritis, unspecified site: Secondary | ICD-10-CM | POA: Diagnosis not present

## 2019-08-13 DIAGNOSIS — H3530 Unspecified macular degeneration: Secondary | ICD-10-CM | POA: Diagnosis not present

## 2019-08-13 DIAGNOSIS — G1221 Amyotrophic lateral sclerosis: Secondary | ICD-10-CM | POA: Diagnosis not present

## 2019-08-18 DIAGNOSIS — G1221 Amyotrophic lateral sclerosis: Secondary | ICD-10-CM | POA: Diagnosis not present

## 2019-08-18 DIAGNOSIS — E785 Hyperlipidemia, unspecified: Secondary | ICD-10-CM | POA: Diagnosis not present

## 2019-08-18 DIAGNOSIS — G4730 Sleep apnea, unspecified: Secondary | ICD-10-CM | POA: Diagnosis not present

## 2019-08-18 DIAGNOSIS — M1990 Unspecified osteoarthritis, unspecified site: Secondary | ICD-10-CM | POA: Diagnosis not present

## 2019-08-18 DIAGNOSIS — H3530 Unspecified macular degeneration: Secondary | ICD-10-CM | POA: Diagnosis not present

## 2019-08-18 DIAGNOSIS — I2510 Atherosclerotic heart disease of native coronary artery without angina pectoris: Secondary | ICD-10-CM | POA: Diagnosis not present

## 2019-08-19 DIAGNOSIS — H3530 Unspecified macular degeneration: Secondary | ICD-10-CM | POA: Diagnosis not present

## 2019-08-19 DIAGNOSIS — E785 Hyperlipidemia, unspecified: Secondary | ICD-10-CM | POA: Diagnosis not present

## 2019-08-19 DIAGNOSIS — M1990 Unspecified osteoarthritis, unspecified site: Secondary | ICD-10-CM | POA: Diagnosis not present

## 2019-08-19 DIAGNOSIS — I2510 Atherosclerotic heart disease of native coronary artery without angina pectoris: Secondary | ICD-10-CM | POA: Diagnosis not present

## 2019-08-19 DIAGNOSIS — G4730 Sleep apnea, unspecified: Secondary | ICD-10-CM | POA: Diagnosis not present

## 2019-08-19 DIAGNOSIS — G1221 Amyotrophic lateral sclerosis: Secondary | ICD-10-CM | POA: Diagnosis not present

## 2019-08-20 DIAGNOSIS — E785 Hyperlipidemia, unspecified: Secondary | ICD-10-CM | POA: Diagnosis not present

## 2019-08-20 DIAGNOSIS — G4730 Sleep apnea, unspecified: Secondary | ICD-10-CM | POA: Diagnosis not present

## 2019-08-20 DIAGNOSIS — H3530 Unspecified macular degeneration: Secondary | ICD-10-CM | POA: Diagnosis not present

## 2019-08-20 DIAGNOSIS — G1221 Amyotrophic lateral sclerosis: Secondary | ICD-10-CM | POA: Diagnosis not present

## 2019-08-20 DIAGNOSIS — I2510 Atherosclerotic heart disease of native coronary artery without angina pectoris: Secondary | ICD-10-CM | POA: Diagnosis not present

## 2019-08-20 DIAGNOSIS — M1990 Unspecified osteoarthritis, unspecified site: Secondary | ICD-10-CM | POA: Diagnosis not present

## 2019-08-25 DIAGNOSIS — G4730 Sleep apnea, unspecified: Secondary | ICD-10-CM | POA: Diagnosis not present

## 2019-08-25 DIAGNOSIS — I2510 Atherosclerotic heart disease of native coronary artery without angina pectoris: Secondary | ICD-10-CM | POA: Diagnosis not present

## 2019-08-25 DIAGNOSIS — M1990 Unspecified osteoarthritis, unspecified site: Secondary | ICD-10-CM | POA: Diagnosis not present

## 2019-08-25 DIAGNOSIS — G1221 Amyotrophic lateral sclerosis: Secondary | ICD-10-CM | POA: Diagnosis not present

## 2019-08-25 DIAGNOSIS — H3530 Unspecified macular degeneration: Secondary | ICD-10-CM | POA: Diagnosis not present

## 2019-08-25 DIAGNOSIS — E785 Hyperlipidemia, unspecified: Secondary | ICD-10-CM | POA: Diagnosis not present

## 2019-08-26 DIAGNOSIS — G1221 Amyotrophic lateral sclerosis: Secondary | ICD-10-CM | POA: Diagnosis not present

## 2019-08-26 DIAGNOSIS — G4730 Sleep apnea, unspecified: Secondary | ICD-10-CM | POA: Diagnosis not present

## 2019-08-26 DIAGNOSIS — E785 Hyperlipidemia, unspecified: Secondary | ICD-10-CM | POA: Diagnosis not present

## 2019-08-26 DIAGNOSIS — M1990 Unspecified osteoarthritis, unspecified site: Secondary | ICD-10-CM | POA: Diagnosis not present

## 2019-08-26 DIAGNOSIS — I2510 Atherosclerotic heart disease of native coronary artery without angina pectoris: Secondary | ICD-10-CM | POA: Diagnosis not present

## 2019-08-26 DIAGNOSIS — H3530 Unspecified macular degeneration: Secondary | ICD-10-CM | POA: Diagnosis not present

## 2019-08-30 DIAGNOSIS — G1221 Amyotrophic lateral sclerosis: Secondary | ICD-10-CM | POA: Diagnosis not present

## 2019-08-30 DIAGNOSIS — H3530 Unspecified macular degeneration: Secondary | ICD-10-CM | POA: Diagnosis not present

## 2019-08-30 DIAGNOSIS — G4730 Sleep apnea, unspecified: Secondary | ICD-10-CM | POA: Diagnosis not present

## 2019-08-30 DIAGNOSIS — M1990 Unspecified osteoarthritis, unspecified site: Secondary | ICD-10-CM | POA: Diagnosis not present

## 2019-08-30 DIAGNOSIS — Z9181 History of falling: Secondary | ICD-10-CM | POA: Diagnosis not present

## 2019-08-30 DIAGNOSIS — I2510 Atherosclerotic heart disease of native coronary artery without angina pectoris: Secondary | ICD-10-CM | POA: Diagnosis not present

## 2019-08-30 DIAGNOSIS — E785 Hyperlipidemia, unspecified: Secondary | ICD-10-CM | POA: Diagnosis not present

## 2019-08-30 DIAGNOSIS — I441 Atrioventricular block, second degree: Secondary | ICD-10-CM | POA: Diagnosis not present

## 2019-09-03 DIAGNOSIS — G4730 Sleep apnea, unspecified: Secondary | ICD-10-CM | POA: Diagnosis not present

## 2019-09-03 DIAGNOSIS — M1990 Unspecified osteoarthritis, unspecified site: Secondary | ICD-10-CM | POA: Diagnosis not present

## 2019-09-03 DIAGNOSIS — G1221 Amyotrophic lateral sclerosis: Secondary | ICD-10-CM | POA: Diagnosis not present

## 2019-09-03 DIAGNOSIS — I2510 Atherosclerotic heart disease of native coronary artery without angina pectoris: Secondary | ICD-10-CM | POA: Diagnosis not present

## 2019-09-03 DIAGNOSIS — H3530 Unspecified macular degeneration: Secondary | ICD-10-CM | POA: Diagnosis not present

## 2019-09-03 DIAGNOSIS — E785 Hyperlipidemia, unspecified: Secondary | ICD-10-CM | POA: Diagnosis not present

## 2019-09-04 DIAGNOSIS — I2510 Atherosclerotic heart disease of native coronary artery without angina pectoris: Secondary | ICD-10-CM | POA: Diagnosis not present

## 2019-09-04 DIAGNOSIS — M1990 Unspecified osteoarthritis, unspecified site: Secondary | ICD-10-CM | POA: Diagnosis not present

## 2019-09-04 DIAGNOSIS — H3530 Unspecified macular degeneration: Secondary | ICD-10-CM | POA: Diagnosis not present

## 2019-09-04 DIAGNOSIS — G4730 Sleep apnea, unspecified: Secondary | ICD-10-CM | POA: Diagnosis not present

## 2019-09-04 DIAGNOSIS — E785 Hyperlipidemia, unspecified: Secondary | ICD-10-CM | POA: Diagnosis not present

## 2019-09-04 DIAGNOSIS — G1221 Amyotrophic lateral sclerosis: Secondary | ICD-10-CM | POA: Diagnosis not present

## 2019-09-07 DIAGNOSIS — M1990 Unspecified osteoarthritis, unspecified site: Secondary | ICD-10-CM | POA: Diagnosis not present

## 2019-09-07 DIAGNOSIS — E785 Hyperlipidemia, unspecified: Secondary | ICD-10-CM | POA: Diagnosis not present

## 2019-09-07 DIAGNOSIS — G1221 Amyotrophic lateral sclerosis: Secondary | ICD-10-CM | POA: Diagnosis not present

## 2019-09-07 DIAGNOSIS — I2510 Atherosclerotic heart disease of native coronary artery without angina pectoris: Secondary | ICD-10-CM | POA: Diagnosis not present

## 2019-09-07 DIAGNOSIS — G4730 Sleep apnea, unspecified: Secondary | ICD-10-CM | POA: Diagnosis not present

## 2019-09-07 DIAGNOSIS — H3530 Unspecified macular degeneration: Secondary | ICD-10-CM | POA: Diagnosis not present

## 2019-09-09 ENCOUNTER — Other Ambulatory Visit: Payer: Self-pay

## 2019-09-09 ENCOUNTER — Ambulatory Visit (INDEPENDENT_AMBULATORY_CARE_PROVIDER_SITE_OTHER): Payer: Medicare Other | Admitting: Neurology

## 2019-09-09 ENCOUNTER — Encounter: Payer: Self-pay | Admitting: Neurology

## 2019-09-09 VITALS — BP 127/76 | HR 76 | Temp 98.2°F

## 2019-09-09 DIAGNOSIS — G4730 Sleep apnea, unspecified: Secondary | ICD-10-CM | POA: Diagnosis not present

## 2019-09-09 DIAGNOSIS — E785 Hyperlipidemia, unspecified: Secondary | ICD-10-CM | POA: Diagnosis not present

## 2019-09-09 DIAGNOSIS — G1221 Amyotrophic lateral sclerosis: Secondary | ICD-10-CM | POA: Diagnosis not present

## 2019-09-09 DIAGNOSIS — M1990 Unspecified osteoarthritis, unspecified site: Secondary | ICD-10-CM | POA: Diagnosis not present

## 2019-09-09 DIAGNOSIS — H3530 Unspecified macular degeneration: Secondary | ICD-10-CM | POA: Diagnosis not present

## 2019-09-09 DIAGNOSIS — I25810 Atherosclerosis of coronary artery bypass graft(s) without angina pectoris: Secondary | ICD-10-CM

## 2019-09-09 DIAGNOSIS — I2510 Atherosclerotic heart disease of native coronary artery without angina pectoris: Secondary | ICD-10-CM | POA: Diagnosis not present

## 2019-09-09 NOTE — Progress Notes (Signed)
Reason for visit: ALS  Jason Tate is an 74 y.o. male  History of present illness:  Jason Tate is a 74 year old right-handed white male with a history of ALS.  The patient is followed through [facility], he is not yet involved in a study trial.  The patient is on Rilutek, but he continues to progress at a fairly rapid rate.  He is losing quite a bit of use of the upper extremities, his legs were stronger however.  He has bilateral foot drops.  He is getting home health care, physical and occupational therapy.  He has fallen on occasion.  He has a Nurse, adult but does not have the room to use it.  He has a wheelchair and a motorized wheelchair that he cannot really use in the house.  The patient is not having much trouble with breathing or swallowing, he senses that his speech is changing slightly.  He does have occasional cramps in the legs but they are not severe.  He recently had a bout of gout in the right foot.  He had gone off of his allopurinol.   Past Medical History:  Diagnosis Date  . Abnormal stress echo Oct 2012  . 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 Arthritis   . CAD (coronary artery disease) October 2012   s/p CABG x 2 per Dr. Cornelius Moras  . Gait abnormality 12/25/2018  . Hypercholesterolemia   . Low testosterone   . Macular degeneration   . 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 Obesity   . PVC (premature ventricular contraction)   . Sleep apnea, obstructive    on BiPAP    Past Surgical History:  Procedure Laterality Date  . COLONOSCOPY    . CORONARY ARTERY BYPASS GRAFT  Oct 2012   LIMA to LAD and left radial to OM  . HEMORRHOIDECTOMY WITH HEMORRHOID BANDING    . INJECTION KNEE      Family History  Problem Relation Age of Onset  . Heart disease Mother   . Stroke Father   . Heart disease Father   . Dementia Father     Social history:  reports that he quit smoking about 39 years ago. His smoking use included cigarettes. He  has a 20.00 pack-year smoking history. He has never used smokeless tobacco. He reports current alcohol use of about 28.0 standard drinks of alcohol per week. He reports that he does not use drugs.    Allergies  Allergen Reactions  . Penicillins Nausea Only    Medications:  Prior to Admission medications   Medication Sig Start Date End Date Taking? Authorizing Provider  allopurinol (ZYLOPRIM) 100 MG tablet Take 1 tablet (100 mg total) by mouth daily. 07/14/19  Yes [name]., MD  aspirin 81 MG tablet Take 81 mg by mouth daily.   Yes [provider]  B Complex Vitamins (VITAMIN B COMPLEX PO) Take 1 tablet by mouth daily.    Yes [provider]  buPROPion (WELLBUTRIN XL) 300 MG 24 hr tablet Take 1 tablet (300 mg total) by mouth daily. 07/14/19  Yes [name]., MD  Cetirizine HCl (ZYRTEC ALLERGY PO) Take 1 tablet by mouth daily as needed (allergies).    Yes [provider]  Cholecalciferol (VITAMIN D PO) Take 4,000 mg by mouth daily.     Yes [provider]  citalopram (CELEXA) 20 MG tablet Take 1 tablet (20 mg total) by mouth daily. 07/14/19  Yes [name]., MD  clonazePAM (KLONOPIN) 1 MG tablet Take 1 tablet (1 mg total) by mouth at bedtime as needed. for sleep 07/14/19  Yes [name]., MD  Copper 5 MG TABS Take 5 mg by mouth daily.   Yes [provider]  fluticasone (FLONASE) 50 MCG/ACT nasal spray Place 1 spray into both nostrils daily as needed for allergies. 09/11/17  Yes [name]., MD  ibuprofen (ADVIL) 200 MG tablet Take 400 mg by mouth at bedtime as needed.   Yes [provider]  nabumetone (RELAFEN) 500 MG tablet Take 1 tablet (500 mg total) by mouth 2 (two) times daily as needed. 07/23/18  Yes [name]., MD  nabumetone (RELAFEN) 750 MG tablet Take 750 mg by mouth daily.   Yes [provider]  NON FORMULARY TUDCA 1 gm 2 per day   Yes [provider]   Omega-3 Fatty Acids (FISH OIL PO) Take 1 tablet by mouth daily.    Yes [provider]  omeprazole (PRILOSEC) 20 MG capsule Take 1 capsule (20 mg total) by mouth daily. 07/23/18  Yes [name]., MD  riluzole (RILUTEK) 50 MG tablet Take 50 mg by mouth every 12 (twelve) hours.   Yes [provider]  sildenafil (REVATIO) 20 MG tablet 1 - 5 PO daily as needed 06/06/17  Yes [name]., MD  simvastatin (ZOCOR) 20 MG tablet Take 1 tablet (20 mg total) by mouth at bedtime. 07/14/19  Yes [name]., MD    ROS:  Out of a complete 14 system review of symptoms, the patient complains only of the following symptoms, and all other reviewed systems are negative.  Right foot pain Muscle cramps Weakness  Blood pressure 127/76, pulse 76, temperature 98.2 F (36.8 C).  Physical Exam  General: The patient is alert and cooperative at the time of the examination.  Skin: 2+ edema below the knees is seen bilaterally.   Neurologic Exam  Mental status: The patient is alert and oriented x 3 at the time of the examination. The patient has apparent normal recent and remote memory, with an apparently normal attention span and concentration ability.   Cranial nerves: Facial symmetry is present. Speech is normal, no aphasia or dysarthria is noted. Extraocular movements are full. Visual fields are full.  Motor: The patient has 2/5 strength grip in the right hand, 4/5 on the left.  The patient has 3/5 biceps and triceps on the right, 4 -/5 strength with biceps and triceps on the left, 3/5 biceps on the right and 4/5 on the left.  Patient has good proximal strength in the legs, bilateral foot drops..  Sensory examination: Soft touch sensation is symmetric on the face, arms, and legs.  Coordination: The patient has difficulty with finger-nose-finger bilaterally, he can perform heel-to-shin bilaterally.  Gait and station: The patient requires assistance with  standing, he can take a step or 2 with examiner but has a tendency to lean backwards.  Reflexes: Deep tendon reflexes are symmetric, somewhat brisk in the legs.   Assessment/Plan:  1.  ALS  2.  Gait disturbance  The patient continues to progress with the ALS, he will remain on the Rilutek.  He will be followed through [facility], hopefully be able to access a study drug in the future.  He will follow-up here in 4 months.  Greater than 50% of the visit was spent in counseling and coordination of  care.  Face-to-face time with the patient was 25 minutes.   Jill Alexanders MD 09/09/2019 12:20 PM  [facility] [address] [place], [place] [address]  Phone [phone] Fax [phone]

## 2019-09-11 NOTE — Progress Notes (Deleted)
Annual Wellness Visit    I,Jason Tate,acting as a scribe for Jason Durie, MD.,have documented all relevant documentation on the behalf of Jason Durie, MD,as directed by  Jason Durie, MD while in the presence of Jason Durie, MD.   Patient: Jason Tate, Male    DOB: 1946/03/28, 74 y.o.   MRN: [id number] Visit Date: 09/15/2019  Today's Provider: [name], MD  Subjective:    Chief Complaint  Patient presents with  . Follow-up  . Hyperlipidemia  . Depression  . Insomnia   CORDARYL DECELLES is a 74 y.o. male who presents today for his Annual Wellness Visit. He reports consuming a {diet types:17450} diet. {Exercise:19826} He generally feels {well/fairly well/poorly:18703}. He reports sleeping {well/fairly well/poorly:18703}. He {does/does not:200015} have additional problems to discuss today.   HPI   {Show patient history (optional):23778::" "}   Medications: Outpatient Medications Prior to Visit  Medication Sig  . allopurinol (ZYLOPRIM) 100 MG tablet Take 1 tablet (100 mg total) by mouth daily.  Marland Kitchen aspirin 81 MG tablet Take 81 mg by mouth daily.  . B Complex Vitamins (VITAMIN B COMPLEX PO) Take 1 tablet by mouth daily.   Marland Kitchen buPROPion (WELLBUTRIN XL) 300 MG 24 hr tablet Take 1 tablet (300 mg total) by mouth daily.  . Cetirizine HCl (ZYRTEC ALLERGY PO) Take 1 tablet by mouth daily as needed (allergies).   . Cholecalciferol (VITAMIN D PO) Take 4,000 mg by mouth daily.    . citalopram (CELEXA) 20 MG tablet Take 1 tablet (20 mg total) by mouth daily.  . clonazePAM (KLONOPIN) 1 MG tablet Take 1 tablet (1 mg total) by mouth at bedtime as needed. for sleep  . Copper 5 MG TABS Take 5 mg by mouth daily.  . fluticasone (FLONASE) 50 MCG/ACT nasal spray Place 1 spray into both nostrils daily as needed for allergies.  Marland Kitchen ibuprofen (ADVIL) 200 MG tablet Take 400 mg by mouth at bedtime as needed.  . nabumetone (RELAFEN) 500 MG tablet Take 1 tablet (500  mg total) by mouth 2 (two) times daily as needed.  . NON FORMULARY TUDCA 1 gm 2 per day  . Omega-3 Fatty Acids (FISH OIL PO) Take 1 tablet by mouth daily.   Marland Kitchen omeprazole (PRILOSEC) 20 MG capsule Take 1 capsule (20 mg total) by mouth daily.  . riluzole (RILUTEK) 50 MG tablet Take 50 mg by mouth every 12 (twelve) hours.  . sildenafil (REVATIO) 20 MG tablet 1 - 5 PO daily as needed  . simvastatin (ZOCOR) 20 MG tablet Take 1 tablet (20 mg total) by mouth at bedtime.  . [DISCONTINUED] nabumetone (RELAFEN) 750 MG tablet Take 750 mg by mouth daily.   No facility-administered medications prior to visit.    Allergies  Allergen Reactions  . Penicillins Nausea Only    Patient Care Team: Jerrol Banana., MD as PCP - General (Unknown Physician Specialty) Martinique, Peter M, MD (Cardiology) Luberta Mutter, MD as Consulting Physician (Ophthalmology) Richmond Campbell, MD as Consulting Physician (Gastroenterology) Kathrynn Ducking, MD as Consulting Physician (Neurology) Ronne Binning, MD as Referring Physician (Neurology) Franchot Mimes, MD (Neurology)  Review of Systems  {Show previous labs (optional):23779::" "}   Objective:    Vitals: BP 110/70 (BP Location: Right Arm, Patient Position: Sitting, Cuff Size: Large)   Pulse 70   Temp (!) 97.3 F (36.3 C) (Other (Comment))   Resp 18   SpO2 99%  {Show previous vital signs (optional):23777::" "}  Physical Exam ***  Most recent functional status assessment: In your present state of health, do you have any difficulty performing the following activities: 06/16/2019  Hearing? N  Vision? Y  Comment Due to MD.  Difficulty concentrating or making decisions? N  Walking or climbing stairs? Y  Comment Avoids steps completely due to ALS.  Dressing or bathing? N  Doing errands, shopping? Y  Comment Does not drive.  Preparing Food and eating ? N  Using the Toilet? N  In the past six months, have you accidently leaked urine? Y  Comment  Occasionally  Do you have problems with loss of bowel control? N  Managing your Medications? N  Managing your Finances? Y  Comment Wife manages finances.  Housekeeping or managing your Housekeeping? Y  Comment Wife does all the housework.  Some recent data might be hidden    Most recent fall risk assessment: Fall Risk  09/09/2019  Falls in the past year? 1  Comment -  Number falls in past yr: 1  Injury with Fall? 0  Comment -  Risk for fall due to : -  Risk for fall due to: Comment -  Follow up -     Most recent depression screenings: PHQ 2/9 Scores 06/16/2019 06/16/2019  PHQ - 2 Score 1 1  PHQ- 9 Score - -    Most recent cognitive screening: 6CIT Screen 06/05/2016  What Year? 0 points  What month? 0 points  What time? 0 points  Count back from 20 0 points  Months in reverse 0 points  Repeat phrase 0 points  Total Score 0    No results found for any visits on 09/14/19.    Assessment & Plan:      Annual wellness visit done today including the all of the following: Reviewed patient's Family Medical History Reviewed and updated list of patient's medical providers Assessment of cognitive impairment was done Assessed patient's functional ability Established a written schedule for health screening services Health Risk Assessent Completed and Reviewed  Exercise Activities and Dietary recommendations Goals    . DIET - INCREASE WATER INTAKE     Recommend to drink at least 6-8 8oz glasses of water per day.       Immunization History  Administered Date(s) Administered  . Fluad Quad(high Dose 65+) 03/03/2019  . Influenza, High Dose Seasonal PF 03/04/2017  . Influenza, Seasonal, Injecte, Preservative Fre 03/23/2015  . Influenza,inj,quad, With Preservative 03/13/2018  . Influenza-Unspecified 03/23/2016  . Pneumococcal Conjugate-13 06/05/2016  . Pneumococcal Polysaccharide-23 06/03/2012  . Td 09/01/2009  . Zoster 08/02/2005    Health Maintenance  Topic Date Due    . Hepatitis C Screening  Never done  . COVID-19 Vaccine (1) Never done  . TETANUS/TDAP  09/02/2019  . INFLUENZA VACCINE  12/27/2019  . COLONOSCOPY  12/07/2020  . PNA vac Low Risk Adult  Completed     Discussed health benefits of physical activity, and encouraged him

## 2019-09-14 ENCOUNTER — Other Ambulatory Visit: Payer: Self-pay

## 2019-09-14 ENCOUNTER — Encounter: Payer: Self-pay | Admitting: Family Medicine

## 2019-09-14 ENCOUNTER — Ambulatory Visit (INDEPENDENT_AMBULATORY_CARE_PROVIDER_SITE_OTHER): Payer: Medicare Other | Admitting: Family Medicine

## 2019-09-14 VITALS — BP 110/70 | HR 70 | Temp 97.3°F | Resp 18

## 2019-09-14 DIAGNOSIS — E7800 Pure hypercholesterolemia, unspecified: Secondary | ICD-10-CM | POA: Diagnosis not present

## 2019-09-14 DIAGNOSIS — G1221 Amyotrophic lateral sclerosis: Secondary | ICD-10-CM

## 2019-09-14 DIAGNOSIS — Z1159 Encounter for screening for other viral diseases: Secondary | ICD-10-CM

## 2019-09-14 DIAGNOSIS — I25810 Atherosclerosis of coronary artery bypass graft(s) without angina pectoris: Secondary | ICD-10-CM | POA: Diagnosis not present

## 2019-09-14 DIAGNOSIS — M109 Gout, unspecified: Secondary | ICD-10-CM

## 2019-09-14 DIAGNOSIS — Z23 Encounter for immunization: Secondary | ICD-10-CM

## 2019-09-14 MED ORDER — NABUMETONE 750 MG PO TABS: 750.0000 mg | ORAL_TABLET | Freq: Every day | ORAL | 1 refills | Status: DC

## 2019-09-14 MED ORDER — PREDNISONE 10 MG PO TABS: 10.0000 mg | ORAL_TABLET | Freq: Every day | ORAL | 1 refills | Status: DC

## 2019-09-14 NOTE — Progress Notes (Signed)
Established patient visit  I,Jason Tate,acting as a scribe for Megan Mans, MD.,have documented all relevant documentation on the behalf of Megan Mans, MD,as directed by  Megan Mans, MD while in the presence of Megan Mans, MD.    Patient: Jason Tate   DOB: April 29, 1946   74 y.o. Male  MRN: [id number] Visit Date: 09/14/2019  Today's healthcare provider: Megan Mans, MD   Chief Complaint  Patient presents with  . Follow-up  . Hyperlipidemia  . Depression  . Insomnia   Subjective    HPI Patient has AWV with [facility] on 06/16/2019. Patient has progressive ALS and is followed by Dr. Anne Hahn in [place] and by [facility].  He is in a wheelchair today.  He walks some at home with a walker and does so very slowly.  His wife brings him in today.  He is right-handed and has progressive right arm and hand weakness more than left. Of note is that he has had a recent flare of gout in his right foot and ankle.  It is much better.  It was very tender and painful and swollen when it happened. Lipid/Cholesterol, Follow-up  Last Lipid Panel: Lab Results  Component Value Date   CHOL 149 06/10/2018   LDLCALC 65 06/10/2018   HDL 67 06/10/2018   TRIG 83 06/10/2018   ALT 27 03/31/2019   AST 25 03/31/2019   PLT 185 12/25/2018    He was last seen for this 2 months ago.      Management since that visit includes;Stopping Zocor today but patient wishes to not stop any medications at this time. Also consider stopping omeprazole. He reports good compliance with treatment. He is not having side effects. none Current diet: well balanced Current exercise: none  Wt Readings from Last 3 Encounters:  07/13/19 187 lb 3.2 oz (84.9 kg)  05/06/19 194 lb (88 kg)  04/07/19 187 lb (84.8 kg)   The 10-year ASCVD risk score Denman George DC Jr., et al., 2013) is: 16.2%  --------------------------------------------------------------------  Depression, unspecified  depression type From 07/13/2019-on buPROPion (WELLBUTRIN XL) 300 mg and citalopram (CELEXA) 20 mg.  Other insomnia From 07/13/2019-on clonazePAM (KLONOPIN) 1 mg.       Medications: Outpatient Medications Prior to Visit  Medication Sig  . allopurinol (ZYLOPRIM) 100 MG tablet Take 1 tablet (100 mg total) by mouth daily.  Marland Kitchen aspirin 81 MG tablet Take 81 mg by mouth daily.  . B Complex Vitamins (VITAMIN B COMPLEX PO) Take 1 tablet by mouth daily.   Marland Kitchen buPROPion (WELLBUTRIN XL) 300 MG 24 hr tablet Take 1 tablet (300 mg total) by mouth daily.  . Cetirizine HCl (ZYRTEC ALLERGY PO) Take 1 tablet by mouth daily as needed (allergies).   . Cholecalciferol (VITAMIN D PO) Take 4,000 mg by mouth daily.    . citalopram (CELEXA) 20 MG tablet Take 1 tablet (20 mg total) by mouth daily.  . clonazePAM (KLONOPIN) 1 MG tablet Take 1 tablet (1 mg total) by mouth at bedtime as needed. for sleep  . Copper 5 MG TABS Take 5 mg by mouth daily.  . fluticasone (FLONASE) 50 MCG/ACT nasal spray Place 1 spray into both nostrils daily as needed for allergies.  Marland Kitchen ibuprofen (ADVIL) 200 MG tablet Take 400 mg by mouth at bedtime as needed.  . nabumetone (RELAFEN) 500 MG tablet Take 1 tablet (500 mg total) by mouth 2 (two) times daily as needed.  . nabumetone (RELAFEN) 750 MG  tablet Take 750 mg by mouth daily.  . NON FORMULARY TUDCA 1 gm 2 per day  . Omega-3 Fatty Acids (FISH OIL PO) Take 1 tablet by mouth daily.   Marland Kitchen omeprazole (PRILOSEC) 20 MG capsule Take 1 capsule (20 mg total) by mouth daily.  . riluzole (RILUTEK) 50 MG tablet Take 50 mg by mouth every 12 (twelve) hours.  . sildenafil (REVATIO) 20 MG tablet 1 - 5 PO daily as needed  . simvastatin (ZOCOR) 20 MG tablet Take 1 tablet (20 mg total) by mouth at bedtime.   No facility-administered medications prior to visit.    Review of Systems  Constitutional: Negative.   HENT: Negative.   Eyes: Negative.   Respiratory: Positive for shortness of breath.        He  just notices recently that his breathing is more shallow than it used to be.  He is not short of breath.  Cardiovascular: Negative.   Gastrointestinal: Negative.   Endocrine: Negative.   Musculoskeletal: Positive for joint swelling.  Allergic/Immunologic: Negative.   Neurological: Negative.   Hematological: Negative.   Psychiatric/Behavioral: Negative.         Objective    BP 110/70 (BP Location: Right Arm, Patient Position: Sitting, Cuff Size: Large)   Pulse 70   Temp (!) 97.3 F (36.3 C) (Other (Comment))   Resp 18   SpO2 99%     Physical Exam Vitals reviewed.  Constitutional:      Comments: He appears disheveled as he has not had a haircut or a shave in probably over a year  HENT:     Head: Normocephalic and atraumatic.     Right Ear: External ear normal.     Left Ear: External ear normal.  Eyes:     General: No scleral icterus. Cardiovascular:     Rate and Rhythm: Normal rate and regular rhythm.     Pulses: Normal pulses.     Heart sounds: Normal heart sounds.  Pulmonary:     Effort: Pulmonary effort is normal.     Breath sounds: Normal breath sounds.  Abdominal:     Palpations: Abdomen is soft.  Neurological:     Mental Status: He is alert and oriented to person, place, and time.     Cranial Nerves: No cranial nerve deficit.     Motor: Weakness present.     Coordination: Coordination abnormal.     Comments: He has obvious significant weakness of his right arm and hand.  Left arm and hand is weak but not as much.  Not attempt to walk the patient.  Psychiatric:        Mood and Affect: Mood normal.        Behavior: Behavior normal.        Thought Content: Thought content normal.        Judgment: Judgment normal.       No results found for any visits on 09/14/19.   Assessment & Plan    1. Hypercholesterolemia On Zocor 20 mg daily - Lipid panel - CBC w/Diff/Platelet - Comprehensive Metabolic Panel (CMET) - TSH  2. Coronary artery disease involving  coronary bypass graft of native heart without angina pectoris All risk factors treated. - Lipid panel - CBC w/Diff/Platelet - Comprehensive Metabolic Panel (CMET) - TSH  3. ALS (amyotrophic lateral sclerosis) (HCC) Progressive.  He is hopeful to be included in a study at [facility].  I will plan to see him in 3 months but his wife might not  be able to get him in office then. - Lipid panel - CBC w/Diff/Platelet - Comprehensive Metabolic Panel (CMET) - TSH  4. Gout of knee, unspecified cause, unspecified chronicity, unspecified laterality I have given her a prescription for prednisone 6-day taper with a refill just in case he gets a flare of his gout again.  This is much better now. - Uric acid  Follow up in 3 months.    No follow-ups on file.      I, [name], MD, have reviewed all documentation for this visit. The documentation on 09/14/19 for the exam, diagnosis, procedures, and orders are all accurate and complete.    Pilar Westergaard Cranford Mon, MD  [facility] [phone] (phone) [phone] (fax)  [place]

## 2019-09-16 DIAGNOSIS — H3530 Unspecified macular degeneration: Secondary | ICD-10-CM | POA: Diagnosis not present

## 2019-09-16 DIAGNOSIS — G4730 Sleep apnea, unspecified: Secondary | ICD-10-CM | POA: Diagnosis not present

## 2019-09-16 DIAGNOSIS — E785 Hyperlipidemia, unspecified: Secondary | ICD-10-CM | POA: Diagnosis not present

## 2019-09-16 DIAGNOSIS — M1990 Unspecified osteoarthritis, unspecified site: Secondary | ICD-10-CM | POA: Diagnosis not present

## 2019-09-16 DIAGNOSIS — G1221 Amyotrophic lateral sclerosis: Secondary | ICD-10-CM | POA: Diagnosis not present

## 2019-09-16 DIAGNOSIS — I2510 Atherosclerotic heart disease of native coronary artery without angina pectoris: Secondary | ICD-10-CM | POA: Diagnosis not present

## 2019-09-17 DIAGNOSIS — H3530 Unspecified macular degeneration: Secondary | ICD-10-CM | POA: Diagnosis not present

## 2019-09-17 DIAGNOSIS — M1990 Unspecified osteoarthritis, unspecified site: Secondary | ICD-10-CM | POA: Diagnosis not present

## 2019-09-17 DIAGNOSIS — G4730 Sleep apnea, unspecified: Secondary | ICD-10-CM | POA: Diagnosis not present

## 2019-09-17 DIAGNOSIS — G1221 Amyotrophic lateral sclerosis: Secondary | ICD-10-CM | POA: Diagnosis not present

## 2019-09-17 DIAGNOSIS — E785 Hyperlipidemia, unspecified: Secondary | ICD-10-CM | POA: Diagnosis not present

## 2019-09-17 DIAGNOSIS — I2510 Atherosclerotic heart disease of native coronary artery without angina pectoris: Secondary | ICD-10-CM | POA: Diagnosis not present

## 2019-09-21 ENCOUNTER — Other Ambulatory Visit: Payer: Self-pay | Admitting: Family Medicine

## 2019-09-21 DIAGNOSIS — M109 Gout, unspecified: Secondary | ICD-10-CM

## 2019-09-21 DIAGNOSIS — E785 Hyperlipidemia, unspecified: Secondary | ICD-10-CM | POA: Diagnosis not present

## 2019-09-21 DIAGNOSIS — M1990 Unspecified osteoarthritis, unspecified site: Secondary | ICD-10-CM | POA: Diagnosis not present

## 2019-09-21 DIAGNOSIS — G1221 Amyotrophic lateral sclerosis: Secondary | ICD-10-CM | POA: Diagnosis not present

## 2019-09-21 DIAGNOSIS — I2510 Atherosclerotic heart disease of native coronary artery without angina pectoris: Secondary | ICD-10-CM | POA: Diagnosis not present

## 2019-09-21 DIAGNOSIS — G4730 Sleep apnea, unspecified: Secondary | ICD-10-CM | POA: Diagnosis not present

## 2019-09-21 DIAGNOSIS — H3530 Unspecified macular degeneration: Secondary | ICD-10-CM | POA: Diagnosis not present

## 2019-09-21 NOTE — Telephone Encounter (Signed)
Copied from CRM [id number]. Topic: Quick Communication - Rx Refill/Question >> Sep 21, 2019  2:32 PM Sedonia Small, [place] A wrote: Medication: allopurinol (ZYLOPRIM) 100 MG tablet(Patient stated he had gout attack and is completely out of medication) (90 day supply)  Has the patient contacted their pharmacy? Yes (Agent: If no, request that the patient contact the pharmacy for the refill.) (Agent: If yes, when and what did the pharmacy advise?)Contact pcp  Preferred Pharmacy (with phone number or street name): [facility] [place], [place] - [address] [facility]  Phone:  [phone] Fax:  [phone]     Agent: Please be advised that RX refills may take up to 3 business days. We ask that you follow-up with your pharmacy.

## 2019-09-21 NOTE — Telephone Encounter (Signed)
Pt requesting refill on Allopurinol. Last RF 07/14/19 # 90 with 3 RF. Attempted to call pharmacy but unable to connect to a pharmacist. Called pt and pt stated he didn't think he had any refills. Informed pt that he has 3 refills remaining. Pt verbalized understanding. Refusing this request.

## 2019-09-23 DIAGNOSIS — M25561 Pain in right knee: Secondary | ICD-10-CM | POA: Diagnosis not present

## 2019-09-23 DIAGNOSIS — M1711 Unilateral primary osteoarthritis, right knee: Secondary | ICD-10-CM | POA: Diagnosis not present

## 2019-09-24 ENCOUNTER — Telehealth: Payer: Self-pay | Admitting: Neurology

## 2019-09-24 DIAGNOSIS — G1221 Amyotrophic lateral sclerosis: Secondary | ICD-10-CM | POA: Diagnosis not present

## 2019-09-24 DIAGNOSIS — E785 Hyperlipidemia, unspecified: Secondary | ICD-10-CM | POA: Diagnosis not present

## 2019-09-24 DIAGNOSIS — I2510 Atherosclerotic heart disease of native coronary artery without angina pectoris: Secondary | ICD-10-CM | POA: Diagnosis not present

## 2019-09-24 DIAGNOSIS — H3530 Unspecified macular degeneration: Secondary | ICD-10-CM | POA: Diagnosis not present

## 2019-09-24 DIAGNOSIS — M1990 Unspecified osteoarthritis, unspecified site: Secondary | ICD-10-CM | POA: Diagnosis not present

## 2019-09-24 DIAGNOSIS — G4730 Sleep apnea, unspecified: Secondary | ICD-10-CM | POA: Diagnosis not present

## 2019-09-24 NOTE — Telephone Encounter (Signed)
I called the patient, he had problems with constipation recently, the issue has resolved, he is now on a fiber supplement.

## 2019-09-24 NOTE — Telephone Encounter (Signed)
Phone rep checked office voicemail's, @ 11:26 pt left message asking to be called to discuss different ALS symptoms that he has experienced.  Please call

## 2019-09-29 DIAGNOSIS — G4730 Sleep apnea, unspecified: Secondary | ICD-10-CM | POA: Diagnosis not present

## 2019-09-29 DIAGNOSIS — Z9181 History of falling: Secondary | ICD-10-CM | POA: Diagnosis not present

## 2019-09-29 DIAGNOSIS — I441 Atrioventricular block, second degree: Secondary | ICD-10-CM | POA: Diagnosis not present

## 2019-09-29 DIAGNOSIS — E785 Hyperlipidemia, unspecified: Secondary | ICD-10-CM | POA: Diagnosis not present

## 2019-09-29 DIAGNOSIS — G1221 Amyotrophic lateral sclerosis: Secondary | ICD-10-CM | POA: Diagnosis not present

## 2019-09-29 DIAGNOSIS — I2510 Atherosclerotic heart disease of native coronary artery without angina pectoris: Secondary | ICD-10-CM | POA: Diagnosis not present

## 2019-09-29 DIAGNOSIS — M1990 Unspecified osteoarthritis, unspecified site: Secondary | ICD-10-CM | POA: Diagnosis not present

## 2019-09-29 DIAGNOSIS — H3530 Unspecified macular degeneration: Secondary | ICD-10-CM | POA: Diagnosis not present

## 2019-10-01 DIAGNOSIS — H3530 Unspecified macular degeneration: Secondary | ICD-10-CM | POA: Diagnosis not present

## 2019-10-01 DIAGNOSIS — G1221 Amyotrophic lateral sclerosis: Secondary | ICD-10-CM | POA: Diagnosis not present

## 2019-10-01 DIAGNOSIS — G4730 Sleep apnea, unspecified: Secondary | ICD-10-CM | POA: Diagnosis not present

## 2019-10-01 DIAGNOSIS — I2510 Atherosclerotic heart disease of native coronary artery without angina pectoris: Secondary | ICD-10-CM | POA: Diagnosis not present

## 2019-10-01 DIAGNOSIS — M1990 Unspecified osteoarthritis, unspecified site: Secondary | ICD-10-CM | POA: Diagnosis not present

## 2019-10-01 DIAGNOSIS — E785 Hyperlipidemia, unspecified: Secondary | ICD-10-CM | POA: Diagnosis not present

## 2019-10-05 DIAGNOSIS — G1221 Amyotrophic lateral sclerosis: Secondary | ICD-10-CM | POA: Diagnosis not present

## 2019-10-05 DIAGNOSIS — G4730 Sleep apnea, unspecified: Secondary | ICD-10-CM | POA: Diagnosis not present

## 2019-10-05 DIAGNOSIS — E785 Hyperlipidemia, unspecified: Secondary | ICD-10-CM | POA: Diagnosis not present

## 2019-10-05 DIAGNOSIS — I2510 Atherosclerotic heart disease of native coronary artery without angina pectoris: Secondary | ICD-10-CM | POA: Diagnosis not present

## 2019-10-05 DIAGNOSIS — H3530 Unspecified macular degeneration: Secondary | ICD-10-CM | POA: Diagnosis not present

## 2019-10-05 DIAGNOSIS — M1990 Unspecified osteoarthritis, unspecified site: Secondary | ICD-10-CM | POA: Diagnosis not present

## 2019-10-08 DIAGNOSIS — E785 Hyperlipidemia, unspecified: Secondary | ICD-10-CM | POA: Diagnosis not present

## 2019-10-08 DIAGNOSIS — I2510 Atherosclerotic heart disease of native coronary artery without angina pectoris: Secondary | ICD-10-CM | POA: Diagnosis not present

## 2019-10-08 DIAGNOSIS — H3530 Unspecified macular degeneration: Secondary | ICD-10-CM | POA: Diagnosis not present

## 2019-10-08 DIAGNOSIS — G4730 Sleep apnea, unspecified: Secondary | ICD-10-CM | POA: Diagnosis not present

## 2019-10-08 DIAGNOSIS — G1221 Amyotrophic lateral sclerosis: Secondary | ICD-10-CM | POA: Diagnosis not present

## 2019-10-08 DIAGNOSIS — M1990 Unspecified osteoarthritis, unspecified site: Secondary | ICD-10-CM | POA: Diagnosis not present

## 2019-10-12 DIAGNOSIS — M109 Gout, unspecified: Secondary | ICD-10-CM | POA: Diagnosis not present

## 2019-10-12 DIAGNOSIS — E7800 Pure hypercholesterolemia, unspecified: Secondary | ICD-10-CM | POA: Diagnosis not present

## 2019-10-12 DIAGNOSIS — I25810 Atherosclerosis of coronary artery bypass graft(s) without angina pectoris: Secondary | ICD-10-CM | POA: Diagnosis not present

## 2019-10-12 DIAGNOSIS — G1221 Amyotrophic lateral sclerosis: Secondary | ICD-10-CM | POA: Diagnosis not present

## 2019-10-13 ENCOUNTER — Telehealth: Payer: Self-pay

## 2019-10-13 DIAGNOSIS — G1221 Amyotrophic lateral sclerosis: Secondary | ICD-10-CM | POA: Diagnosis not present

## 2019-10-13 DIAGNOSIS — H3530 Unspecified macular degeneration: Secondary | ICD-10-CM | POA: Diagnosis not present

## 2019-10-13 DIAGNOSIS — G4730 Sleep apnea, unspecified: Secondary | ICD-10-CM | POA: Diagnosis not present

## 2019-10-13 DIAGNOSIS — E785 Hyperlipidemia, unspecified: Secondary | ICD-10-CM | POA: Diagnosis not present

## 2019-10-13 DIAGNOSIS — M1990 Unspecified osteoarthritis, unspecified site: Secondary | ICD-10-CM | POA: Diagnosis not present

## 2019-10-13 DIAGNOSIS — I2510 Atherosclerotic heart disease of native coronary artery without angina pectoris: Secondary | ICD-10-CM | POA: Diagnosis not present

## 2019-10-13 LAB — COMPREHENSIVE METABOLIC PANEL
ALT: 18 IU/L (ref 0–44)
AST: 17 IU/L (ref 0–40)
Albumin/Globulin Ratio: 2.3 — ABNORMAL HIGH (ref 1.2–2.2)
Albumin: 4.5 g/dL (ref 3.7–4.7)
Alkaline Phosphatase: 71 IU/L (ref 48–121)
BUN/Creatinine Ratio: 20 (ref 10–24)
BUN: 15 mg/dL (ref 8–27)
Bilirubin Total: 0.5 mg/dL (ref 0.0–1.2)
CO2: 23 mmol/L (ref 20–29)
Calcium: 9.7 mg/dL (ref 8.6–10.2)
Chloride: 100 mmol/L (ref 96–106)
Creatinine, Ser: 0.74 mg/dL — ABNORMAL LOW (ref 0.76–1.27)
GFR calc Af Amer: 104 mL/min/{1.73_m2} (ref >59)
GFR calc non Af Amer: 90 mL/min/{1.73_m2} (ref >59)
Globulin, Total: 2 g/dL (ref 1.5–4.5)
Glucose: 91 mg/dL (ref 65–99)
Potassium: 4.7 mmol/L (ref 3.5–5.2)
Sodium: 139 mmol/L (ref 134–144)
Total Protein: 6.5 g/dL (ref 6.0–8.5)

## 2019-10-13 LAB — CBC WITH DIFFERENTIAL/PLATELET
Basophils Absolute: 0.1 10*3/uL (ref 0.0–0.2)
Basos: 1 %
EOS (ABSOLUTE): 0.2 10*3/uL (ref 0.0–0.4)
Eos: 2 %
Hematocrit: 37.1 % — ABNORMAL LOW (ref 37.5–51.0)
Hemoglobin: 12.3 g/dL — ABNORMAL LOW (ref 13.0–17.7)
Immature Grans (Abs): 0.1 10*3/uL (ref 0.0–0.1)
Immature Granulocytes: 1 %
Lymphocytes Absolute: 1.3 10*3/uL (ref 0.7–3.1)
Lymphs: 12 %
MCH: 31.4 pg (ref 26.6–33.0)
MCHC: 33.2 g/dL (ref 31.5–35.7)
MCV: 95 fL (ref 79–97)
Monocytes Absolute: 0.6 10*3/uL (ref 0.1–0.9)
Monocytes: 6 %
Neutrophils Absolute: 8.4 10*3/uL — ABNORMAL HIGH (ref 1.4–7.0)
Neutrophils: 78 %
Platelets: 209 10*3/uL (ref 150–450)
RBC: 3.92 x10E6/uL — ABNORMAL LOW (ref 4.14–5.80)
RDW: 13.3 % (ref 11.6–15.4)
WBC: 10.6 10*3/uL (ref 3.4–10.8)

## 2019-10-13 LAB — LIPID PANEL
Chol/HDL Ratio: 2.3 ratio (ref 0.0–5.0)
Cholesterol, Total: 171 mg/dL (ref 100–199)
HDL: 73 mg/dL (ref >39)
LDL Chol Calc (NIH): 84 mg/dL (ref 0–99)
Triglycerides: 72 mg/dL (ref 0–149)
VLDL Cholesterol Cal: 14 mg/dL (ref 5–40)

## 2019-10-13 LAB — TSH: TSH: 1.88 u[IU]/mL (ref 0.450–4.500)

## 2019-10-13 LAB — URIC ACID: Uric Acid: 7.1 mg/dL (ref 3.8–8.4)

## 2019-10-13 NOTE — Telephone Encounter (Signed)
-----   Message from [name]., MD sent at 10/13/2019  2:54 PM EDT ----- Labs stable.

## 2019-10-13 NOTE — Telephone Encounter (Signed)
Patient advised.

## 2019-10-15 DIAGNOSIS — G1221 Amyotrophic lateral sclerosis: Secondary | ICD-10-CM | POA: Diagnosis not present

## 2019-10-15 DIAGNOSIS — I2510 Atherosclerotic heart disease of native coronary artery without angina pectoris: Secondary | ICD-10-CM | POA: Diagnosis not present

## 2019-10-15 DIAGNOSIS — E785 Hyperlipidemia, unspecified: Secondary | ICD-10-CM | POA: Diagnosis not present

## 2019-10-15 DIAGNOSIS — G4730 Sleep apnea, unspecified: Secondary | ICD-10-CM | POA: Diagnosis not present

## 2019-10-15 DIAGNOSIS — M1990 Unspecified osteoarthritis, unspecified site: Secondary | ICD-10-CM | POA: Diagnosis not present

## 2019-10-15 DIAGNOSIS — H3530 Unspecified macular degeneration: Secondary | ICD-10-CM | POA: Diagnosis not present

## 2019-10-19 DIAGNOSIS — H3530 Unspecified macular degeneration: Secondary | ICD-10-CM | POA: Diagnosis not present

## 2019-10-19 DIAGNOSIS — M1990 Unspecified osteoarthritis, unspecified site: Secondary | ICD-10-CM | POA: Diagnosis not present

## 2019-10-19 DIAGNOSIS — E785 Hyperlipidemia, unspecified: Secondary | ICD-10-CM | POA: Diagnosis not present

## 2019-10-19 DIAGNOSIS — I2510 Atherosclerotic heart disease of native coronary artery without angina pectoris: Secondary | ICD-10-CM | POA: Diagnosis not present

## 2019-10-19 DIAGNOSIS — G1221 Amyotrophic lateral sclerosis: Secondary | ICD-10-CM | POA: Diagnosis not present

## 2019-10-19 DIAGNOSIS — G4730 Sleep apnea, unspecified: Secondary | ICD-10-CM | POA: Diagnosis not present

## 2019-10-20 DIAGNOSIS — Z79899 Other long term (current) drug therapy: Secondary | ICD-10-CM | POA: Diagnosis not present

## 2019-10-20 DIAGNOSIS — G1221 Amyotrophic lateral sclerosis: Secondary | ICD-10-CM | POA: Diagnosis not present

## 2019-10-20 DIAGNOSIS — R296 Repeated falls: Secondary | ICD-10-CM | POA: Diagnosis not present

## 2019-10-20 DIAGNOSIS — R471 Dysarthria and anarthria: Secondary | ICD-10-CM | POA: Diagnosis not present

## 2019-10-20 DIAGNOSIS — Z5181 Encounter for therapeutic drug level monitoring: Secondary | ICD-10-CM | POA: Diagnosis not present

## 2019-10-20 DIAGNOSIS — M6281 Muscle weakness (generalized): Secondary | ICD-10-CM | POA: Diagnosis not present

## 2019-10-20 DIAGNOSIS — Z789 Other specified health status: Secondary | ICD-10-CM | POA: Diagnosis not present

## 2019-10-21 DIAGNOSIS — I2510 Atherosclerotic heart disease of native coronary artery without angina pectoris: Secondary | ICD-10-CM | POA: Diagnosis not present

## 2019-10-21 DIAGNOSIS — M1990 Unspecified osteoarthritis, unspecified site: Secondary | ICD-10-CM | POA: Diagnosis not present

## 2019-10-21 DIAGNOSIS — H3530 Unspecified macular degeneration: Secondary | ICD-10-CM | POA: Diagnosis not present

## 2019-10-21 DIAGNOSIS — G1221 Amyotrophic lateral sclerosis: Secondary | ICD-10-CM | POA: Diagnosis not present

## 2019-10-21 DIAGNOSIS — G4730 Sleep apnea, unspecified: Secondary | ICD-10-CM | POA: Diagnosis not present

## 2019-10-21 DIAGNOSIS — E785 Hyperlipidemia, unspecified: Secondary | ICD-10-CM | POA: Diagnosis not present

## 2019-10-27 DIAGNOSIS — H3530 Unspecified macular degeneration: Secondary | ICD-10-CM | POA: Diagnosis not present

## 2019-10-27 DIAGNOSIS — G1221 Amyotrophic lateral sclerosis: Secondary | ICD-10-CM | POA: Diagnosis not present

## 2019-10-27 DIAGNOSIS — E785 Hyperlipidemia, unspecified: Secondary | ICD-10-CM | POA: Diagnosis not present

## 2019-10-27 DIAGNOSIS — G4730 Sleep apnea, unspecified: Secondary | ICD-10-CM | POA: Diagnosis not present

## 2019-10-27 DIAGNOSIS — M1990 Unspecified osteoarthritis, unspecified site: Secondary | ICD-10-CM | POA: Diagnosis not present

## 2019-10-27 DIAGNOSIS — I2510 Atherosclerotic heart disease of native coronary artery without angina pectoris: Secondary | ICD-10-CM | POA: Diagnosis not present

## 2019-10-29 DIAGNOSIS — Z9181 History of falling: Secondary | ICD-10-CM | POA: Diagnosis not present

## 2019-10-29 DIAGNOSIS — H3530 Unspecified macular degeneration: Secondary | ICD-10-CM | POA: Diagnosis not present

## 2019-10-29 DIAGNOSIS — M1990 Unspecified osteoarthritis, unspecified site: Secondary | ICD-10-CM | POA: Diagnosis not present

## 2019-10-29 DIAGNOSIS — G1221 Amyotrophic lateral sclerosis: Secondary | ICD-10-CM | POA: Diagnosis not present

## 2019-10-29 DIAGNOSIS — E785 Hyperlipidemia, unspecified: Secondary | ICD-10-CM | POA: Diagnosis not present

## 2019-10-29 DIAGNOSIS — G4730 Sleep apnea, unspecified: Secondary | ICD-10-CM | POA: Diagnosis not present

## 2019-10-29 DIAGNOSIS — I2510 Atherosclerotic heart disease of native coronary artery without angina pectoris: Secondary | ICD-10-CM | POA: Diagnosis not present

## 2019-10-29 DIAGNOSIS — I441 Atrioventricular block, second degree: Secondary | ICD-10-CM | POA: Diagnosis not present

## 2019-11-04 DIAGNOSIS — G1221 Amyotrophic lateral sclerosis: Secondary | ICD-10-CM | POA: Diagnosis not present

## 2019-11-04 DIAGNOSIS — E785 Hyperlipidemia, unspecified: Secondary | ICD-10-CM | POA: Diagnosis not present

## 2019-11-04 DIAGNOSIS — I2510 Atherosclerotic heart disease of native coronary artery without angina pectoris: Secondary | ICD-10-CM | POA: Diagnosis not present

## 2019-11-04 DIAGNOSIS — G4730 Sleep apnea, unspecified: Secondary | ICD-10-CM | POA: Diagnosis not present

## 2019-11-04 DIAGNOSIS — M1990 Unspecified osteoarthritis, unspecified site: Secondary | ICD-10-CM | POA: Diagnosis not present

## 2019-11-04 DIAGNOSIS — H3530 Unspecified macular degeneration: Secondary | ICD-10-CM | POA: Diagnosis not present

## 2019-11-11 DIAGNOSIS — G1221 Amyotrophic lateral sclerosis: Secondary | ICD-10-CM | POA: Diagnosis not present

## 2019-11-11 DIAGNOSIS — I2510 Atherosclerotic heart disease of native coronary artery without angina pectoris: Secondary | ICD-10-CM | POA: Diagnosis not present

## 2019-11-11 DIAGNOSIS — G4730 Sleep apnea, unspecified: Secondary | ICD-10-CM | POA: Diagnosis not present

## 2019-11-11 DIAGNOSIS — E785 Hyperlipidemia, unspecified: Secondary | ICD-10-CM | POA: Diagnosis not present

## 2019-11-11 DIAGNOSIS — H3530 Unspecified macular degeneration: Secondary | ICD-10-CM | POA: Diagnosis not present

## 2019-11-11 DIAGNOSIS — M1990 Unspecified osteoarthritis, unspecified site: Secondary | ICD-10-CM | POA: Diagnosis not present

## 2019-11-18 ENCOUNTER — Telehealth: Payer: Self-pay | Admitting: Family Medicine

## 2019-11-18 DIAGNOSIS — I2510 Atherosclerotic heart disease of native coronary artery without angina pectoris: Secondary | ICD-10-CM | POA: Diagnosis not present

## 2019-11-18 DIAGNOSIS — H3530 Unspecified macular degeneration: Secondary | ICD-10-CM | POA: Diagnosis not present

## 2019-11-18 DIAGNOSIS — E785 Hyperlipidemia, unspecified: Secondary | ICD-10-CM | POA: Diagnosis not present

## 2019-11-18 DIAGNOSIS — G1221 Amyotrophic lateral sclerosis: Secondary | ICD-10-CM | POA: Diagnosis not present

## 2019-11-18 DIAGNOSIS — M1990 Unspecified osteoarthritis, unspecified site: Secondary | ICD-10-CM | POA: Diagnosis not present

## 2019-11-18 DIAGNOSIS — G4730 Sleep apnea, unspecified: Secondary | ICD-10-CM | POA: Diagnosis not present

## 2019-11-18 DIAGNOSIS — K219 Gastro-esophageal reflux disease without esophagitis: Secondary | ICD-10-CM

## 2019-11-18 MED ORDER — OMEPRAZOLE 20 MG PO CPDR: 20.0000 mg | DELAYED_RELEASE_CAPSULE | Freq: Every day | ORAL | 3 refills | Status: DC

## 2019-11-18 NOTE — Telephone Encounter (Signed)
Medication Refill - Medication: omeprazole (PRILOSEC) 20 MG capsule   Has the patient contacted their pharmacy? Yes.   (Agent: If no, request that the patient contact the pharmacy for the refill.) (Agent: If yes, when and what did the pharmacy advise?)  Preferred Pharmacy (with phone number or street name):  [facility] [place], [place] - [address] to [facility] Phone:  [phone]  Fax:  [phone]       Agent: Please be advised that RX refills may take up to 3 business days. We ask that you follow-up with your pharmacy.

## 2019-11-18 NOTE — Telephone Encounter (Signed)
Medication refill sent to pharmacy  

## 2019-11-19 ENCOUNTER — Telehealth: Payer: Self-pay | Admitting: Family Medicine

## 2019-11-19 ENCOUNTER — Ambulatory Visit: Payer: Self-pay | Admitting: *Deleted

## 2019-11-19 DIAGNOSIS — N390 Urinary tract infection, site not specified: Secondary | ICD-10-CM

## 2019-11-19 MED ORDER — CIPROFLOXACIN HCL 500 MG PO TABS: 500.0000 mg | ORAL_TABLET | Freq: Two times a day (BID) | ORAL | 0 refills | Status: DC

## 2019-11-19 NOTE — Telephone Encounter (Signed)
Attempted to call patient to discuss symptoms/triage and need for appointment- left message on home number to return call to office

## 2019-11-19 NOTE — Telephone Encounter (Signed)
Patient has ALS.  Try MiraLAX daily for the constipation.  For the possible UTI treat with Cipro 500 mg twice a day for 5 days

## 2019-11-19 NOTE — Telephone Encounter (Signed)
Patient is calling to request antibiotic treatment for UTI. Patient states he has been experiencing burning with urination and now has difficulty urinating. Explained to patient with antibiotic request and his lack of history with UTI- he will need appointment. Call to office to see if he can be seen today- otherwise he will need to go to UC. Patient is aware and office is going to check with provider. Patient also states he is dealing with constipation and is requesting laxative for that- OTC treatment not helping. Patient put wife on the phone to finish conversation- advised her if he is unable to urinate he needs to be seen immediately and don not wait- she understands. Reason for Disposition . [1] SEVERE pain with urination  (e.g., excruciating) AND [2] not improved after 2 hours of pain medicine (e.g., acetaminophen or ibuprofen)  Answer Assessment - Initial Assessment Questions 1. SEVERITY: "How bad is the pain?"  (e.g., Scale 1-10; mild, moderate, or severe)   - MILD (1-3): complains slightly about urination hurting   - MODERATE (4-7): interferes with normal activities     - SEVERE (8-10): excruciating, unwilling or unable to urinate because of the pain      severe 2. FREQUENCY: "How many times have you had painful urination today?"      everytime 3. PATTERN: "Is pain present every time you urinate or just sometimes?"      yes 4. ONSET: "When did the painful urination start?"      2 days 5. FEVER: "Do you have a fever?" If Yes, ask: "What is your temperature, how was it measured, and when did it start?"     no 6. PAST UTI: "Have you had a urine infection before?" If Yes, ask: "When was the last time?" and "What happened that time?"      no 7. CAUSE: "What do you think is causing the painful urination?"      Possible UTI 8. OTHER SYMPTOMS: "Do you have any other symptoms?" (e.g., flank pain, penile discharge, scrotal pain, blood in urine)     Constipation present as well  Answer  Assessment - Initial Assessment Questions 1. SYMPTOM: "What's the main symptom you're concerned about?" (e.g., frequency, incontinence)     Burning and difficulty with urination- symptom progressively getting worse. 2. ONSET: "When did the  symptoms  start?"     Yesterday- 1-2 days 3. PAIN: "Is there any pain?" If Yes, ask: "How bad is it?" (Scale: 1-10; mild, moderate, severe)     Yes- 10 4. CAUSE: "What do you think is causing the symptoms?"     UTI 5. OTHER SYMPTOMS: "Do you have any other symptoms?" (e.g., fever, flank pain, blood in urine, pain with urination)     pain 6. PREGNANCY: "Is there any chance you are pregnant?" "When was your last menstrual period?"     n/a  Protocols used: URINATION PAIN - MALE-A-AH, URINARY SYMPTOMS-A-AH

## 2019-11-19 NOTE — Telephone Encounter (Signed)
Medication Refill - Medication: UTI medication and Laxative medication  Has the patient contacted their pharmacy? Yes (Agent: If no, request that the patient contact the pharmacy for the refill.) (Agent: If yes, when and what did the pharmacy advise?)contact pcp  Preferred Pharmacy (with phone number or street name):  [id number] [address] Phone:  [phone]  Fax:  [phone]       Agent: Please be advised that RX refills may take up to 3 business days. We ask that you follow-up with your pharmacy.

## 2019-11-19 NOTE — Addendum Note (Signed)
Addended by: Aleene Davidson on: 11/19/2019 02:57 PM   Modules accepted: Orders

## 2019-11-19 NOTE — Telephone Encounter (Signed)
Patient advised as below.  

## 2019-11-19 NOTE — Telephone Encounter (Signed)
See triage note- UTI symptoms triaged- constipation not triaged- patient left phone to wife.

## 2019-11-20 ENCOUNTER — Other Ambulatory Visit: Payer: Self-pay | Admitting: Family Medicine

## 2019-11-20 ENCOUNTER — Telehealth: Payer: Self-pay

## 2019-11-20 DIAGNOSIS — N390 Urinary tract infection, site not specified: Secondary | ICD-10-CM

## 2019-11-20 MED ORDER — CIPROFLOXACIN HCL 500 MG PO TABS: 500.0000 mg | ORAL_TABLET | Freq: Two times a day (BID) | ORAL | 0 refills | Status: DC

## 2019-11-20 NOTE — Telephone Encounter (Signed)
Requested medication (s) are due for refill today: yes  Requested medication (s) are on the active medication list: yes  Last refill:    Future visit scheduled: yes  Notes to clinic:  no assigned protocol    Requested Prescriptions  Pending Prescriptions Disp Refills   ciprofloxacin (CIPRO) 500 MG tablet 10 tablet 0    Sig: Take 1 tablet (500 mg total) by mouth 2 (two) times daily.      Off-Protocol Failed - 11/20/2019  9:44 AM      Failed - Medication not assigned to a protocol, review manually.      Passed - Valid encounter within last 12 months    Recent Outpatient Visits           2 months ago Hypercholesterolemia   [facility] [name]., MD   4 months ago ALS (amyotrophic lateral sclerosis) [facility])   [facility] [name]., MD   9 months ago ALS (amyotrophic lateral sclerosis) [facility])   [facility] [name]., MD   1 year ago Cervical radiculopathy   [facility] [name]., MD   1 year ago Fall, subsequent encounter   [facility] [name]., MD       Future Appointments             In 3 weeks [name]., MD [facility], [facility]

## 2019-11-20 NOTE — Telephone Encounter (Signed)
[facility] and [facility] not [facility] for pick up. In system it appears that Cipro was called to [facility]. I contacted pharmacy who states that they never received prescription and that it had not been called in. I told pharmacist to disregard call, I will authorize and send in [facility]. Jason Tate

## 2019-11-20 NOTE — Telephone Encounter (Signed)
Medication: ciprofloxacin (CIPRO) 500 MG tablet [[id number]] - Medication was sent to the wrong Pharmacy. Can this be sent the pharmacy below?  Has the patient contacted their pharmacy? Yes  (Agent: If no, request that the patient contact the pharmacy for the refill.) (Agent: If yes, when and what did the pharmacy advise?)  Preferred Pharmacy (with phone number or street name[facility] [address] [place], [place]. [address] [phone]  Agent: Please be advised that RX refills may take up to 3 business days. We ask that you follow-up with your pharmacy.

## 2019-11-23 DIAGNOSIS — M1990 Unspecified osteoarthritis, unspecified site: Secondary | ICD-10-CM | POA: Diagnosis not present

## 2019-11-23 DIAGNOSIS — G4730 Sleep apnea, unspecified: Secondary | ICD-10-CM | POA: Diagnosis not present

## 2019-11-23 DIAGNOSIS — G1221 Amyotrophic lateral sclerosis: Secondary | ICD-10-CM | POA: Diagnosis not present

## 2019-11-23 DIAGNOSIS — H3530 Unspecified macular degeneration: Secondary | ICD-10-CM | POA: Diagnosis not present

## 2019-11-23 DIAGNOSIS — I2510 Atherosclerotic heart disease of native coronary artery without angina pectoris: Secondary | ICD-10-CM | POA: Diagnosis not present

## 2019-11-23 DIAGNOSIS — E785 Hyperlipidemia, unspecified: Secondary | ICD-10-CM | POA: Diagnosis not present

## 2019-11-25 ENCOUNTER — Telehealth: Payer: Self-pay | Admitting: Neurology

## 2019-11-25 DIAGNOSIS — G1221 Amyotrophic lateral sclerosis: Secondary | ICD-10-CM

## 2019-11-25 NOTE — Telephone Encounter (Signed)
I receive a call from EMS [facility]. He was at the pts house to help assist the wife. They stated pts wife has call 15 to 20 times in the last 30 to 60 days for assistance with getting pt up with any activity or assistance to the bathroom. THe wife is unable to help at tmes.He wanted to know could we do a referral for home therapy and social worker to get some assistance for the pt. He states the pts ALS is getting worse. I stated primary neurologist is not in today and it will be sent to work in [place] to review. I stated the work in [place] is seeing pts and will be sent message of their concerns. Scott wanted a call back at [phone]. I stated if this order is approve all referrals go to Darreld Mclean our coordinator to send orders over. Scott verbalized understanding.

## 2019-11-25 NOTE — Telephone Encounter (Signed)
I have put in the urgent refer for home health for nursing care, social work and PT if necessary  Orders Placed This Encounter  Procedures  . Ambulatory referral to [facility]    Annabelle Harman, please work on this urgently

## 2019-11-25 NOTE — Addendum Note (Signed)
Addended by: Levert Feinstein on: 11/25/2019 02:45 PM   Modules accepted: Orders

## 2019-11-25 NOTE — Telephone Encounter (Signed)
Pt's wife called asking the provider to call back to be advised on what can be done for the pt about his pain in his bladder and not being able to get up to go to the restroom. They have had to call the EMT for help to get him up and she is wondering if he is just needing to go to the hospital. Please advise.

## 2019-11-26 ENCOUNTER — Telehealth: Payer: Self-pay

## 2019-11-26 NOTE — Telephone Encounter (Signed)
This is a late entry from 11/25/2019 before the power went out at our office. Phone call was made but unable to document until today.   I call EMS Milbert Coulter back that orders were put in for pt to have therapy,nursing care, social worker. I stated Annabelle Harman will communicate with the wife about help for pt. Scott verbalized understanding.

## 2019-11-26 NOTE — Telephone Encounter (Signed)
Wife called back for update,  She is aware that you will call back when you have more information

## 2019-11-26 NOTE — Telephone Encounter (Signed)
Called patient wife and spoke to her and relayed that power went out and I was worked as urgent and waiting to hear back from them so them can come out today . I will call patient's wife back with update.

## 2019-11-26 NOTE — Telephone Encounter (Signed)
Called Jason Tate , [place] couldn't take patient on. Spoke to [facility] and they could go out and see patient today 11/26/2019  under the order of PT and social work .  Nursing can't start until the week after . But at lease PT can assist with the things patient needs and social work can give direction . I spoke to [employer] about this and Katrina RN spoke to Dr. Terrace Arabia - Dr. Terrace Arabia gave ok for verbal order. Katrina Charity fundraiser called and gave verbal order to [place] at [facility] . Patient's wife has been contacted and patient will be seen today.

## 2019-11-26 NOTE — Telephone Encounter (Signed)
Noted see previous phone note.

## 2019-11-26 NOTE — Telephone Encounter (Signed)
I spoke with Nasha at [facility] at [phone]. She stated verbal orders are needed for physical therapy and social worker. I spoke with Dr Terrace Arabia and she gave me verbal orders to do PT and social worker for pt asap. I also spoke with Lanora Manis RN to give same verbal orders to.

## 2019-11-28 DIAGNOSIS — G4730 Sleep apnea, unspecified: Secondary | ICD-10-CM | POA: Diagnosis not present

## 2019-11-28 DIAGNOSIS — Z9181 History of falling: Secondary | ICD-10-CM | POA: Diagnosis not present

## 2019-11-28 DIAGNOSIS — E785 Hyperlipidemia, unspecified: Secondary | ICD-10-CM | POA: Diagnosis not present

## 2019-11-28 DIAGNOSIS — G1221 Amyotrophic lateral sclerosis: Secondary | ICD-10-CM | POA: Diagnosis not present

## 2019-11-28 DIAGNOSIS — I441 Atrioventricular block, second degree: Secondary | ICD-10-CM | POA: Diagnosis not present

## 2019-11-28 DIAGNOSIS — I2510 Atherosclerotic heart disease of native coronary artery without angina pectoris: Secondary | ICD-10-CM | POA: Diagnosis not present

## 2019-11-28 DIAGNOSIS — M1990 Unspecified osteoarthritis, unspecified site: Secondary | ICD-10-CM | POA: Diagnosis not present

## 2019-11-28 DIAGNOSIS — H3530 Unspecified macular degeneration: Secondary | ICD-10-CM | POA: Diagnosis not present

## 2019-12-01 ENCOUNTER — Telehealth: Payer: Self-pay

## 2019-12-01 DIAGNOSIS — G1221 Amyotrophic lateral sclerosis: Secondary | ICD-10-CM

## 2019-12-01 MED ORDER — HYDROCODONE-ACETAMINOPHEN 5-325 MG PO TABS: 1.0000 | ORAL_TABLET | Freq: Four times a day (QID) | ORAL | 0 refills | Status: DC | PRN

## 2019-12-01 NOTE — Telephone Encounter (Signed)
I called and talk with the patient and the wife.  The patient is falling frequently, they do get some equipment through the [facility] clinic, they desperately need someone in the house for personal care aide.  I will go ahead and make a referral to hospice, the patient is no longer able to stand by himself, he is spending virtually all of his time in a lift chair.  His wife is unable to support him when he tries to stand for transfers.  They are having to call EMS frequently due to falls.  I will call in a medication for pain.

## 2019-12-01 NOTE — Addendum Note (Signed)
Addended by: York Spaniel on: 12/01/2019 05:03 PM   Modules accepted: Orders

## 2019-12-01 NOTE — Telephone Encounter (Signed)
I called pts wife about pt needing supplies for his bed. The wife stated they needed the gel pad for the bed.The wife stated the [facility] neurologist order the bed DME order. I advise her to call [facility] coordinator to get order for gel pad being that they did order for a speciality bed for him. The wife verbalized understanding.  The wife stated they need help with a CNA in transporting pt at times. I stated the order was put in urgently by Dr. Terrace Arabia work in [place]. I stated we put orders in for PT and social worker and they have to evaluate pt for a personal care aid. I advise her to call social worker back about if he qualifies for PCA based on his insurance. I stated the goal is to get pt able to maneuver and be able to use do ADL with assistance.   The wife stated pt wants Dr .Anne Hahn to know he is having pain over his whole body. The wife stated Dr. Anne Hahn told him that he can prescribed him pain medicine if needed. I stated message will be sent to Dr. Anne Hahn. The wife verbalized understanding.

## 2019-12-01 NOTE — Telephone Encounter (Signed)
Pt is requesting a call back re: bed that was ordered. He said it is uncomfortable and he said he needs gel overlay for a twin extra long bed.  Pt is requesting a call back from Dr. Anne Hahn Nurse. Pt says he has other concerns he would like to discuss with Dr. Anne Hahn.

## 2019-12-02 NOTE — Telephone Encounter (Signed)
Latasha from [phone] xt [phone] is asking for a call back to know if Dr Anne Hahn will agree to be the pt Hospice attending since he put the order in.  Please call Luna Kitchens

## 2019-12-02 NOTE — Telephone Encounter (Signed)
I will be happy to be the attending physician.  I called Latosha and left a message.

## 2019-12-03 DIAGNOSIS — E785 Hyperlipidemia, unspecified: Secondary | ICD-10-CM | POA: Diagnosis not present

## 2019-12-03 DIAGNOSIS — G4730 Sleep apnea, unspecified: Secondary | ICD-10-CM | POA: Diagnosis not present

## 2019-12-03 DIAGNOSIS — G1221 Amyotrophic lateral sclerosis: Secondary | ICD-10-CM | POA: Diagnosis not present

## 2019-12-03 DIAGNOSIS — M1990 Unspecified osteoarthritis, unspecified site: Secondary | ICD-10-CM | POA: Diagnosis not present

## 2019-12-03 DIAGNOSIS — H3530 Unspecified macular degeneration: Secondary | ICD-10-CM | POA: Diagnosis not present

## 2019-12-03 DIAGNOSIS — I2510 Atherosclerotic heart disease of native coronary artery without angina pectoris: Secondary | ICD-10-CM | POA: Diagnosis not present

## 2019-12-03 NOTE — Telephone Encounter (Signed)
I called and left a voice message for Jason Tate, this patient has progressed with ALS rapidly, although I cannot be 100% certain, I suspect his expected lifespan is less than 6 months.

## 2019-12-03 NOTE — Telephone Encounter (Signed)
Latasha from [phone] xt [phone] is asking for a call back

## 2019-12-03 NOTE — Telephone Encounter (Signed)
I called Jason Tate to give her Dr Anne Hahn okay to be the attending physician to receive hospice services. She would like verbal order for certification stating that pt will have 6 months or  less to live for hospice services.I stated message will be sent to Dr. Anne Hahn to review.

## 2019-12-04 DIAGNOSIS — G4730 Sleep apnea, unspecified: Secondary | ICD-10-CM | POA: Diagnosis not present

## 2019-12-04 DIAGNOSIS — E785 Hyperlipidemia, unspecified: Secondary | ICD-10-CM | POA: Diagnosis not present

## 2019-12-04 DIAGNOSIS — H3530 Unspecified macular degeneration: Secondary | ICD-10-CM | POA: Diagnosis not present

## 2019-12-04 DIAGNOSIS — I2510 Atherosclerotic heart disease of native coronary artery without angina pectoris: Secondary | ICD-10-CM | POA: Diagnosis not present

## 2019-12-04 DIAGNOSIS — G1221 Amyotrophic lateral sclerosis: Secondary | ICD-10-CM | POA: Diagnosis not present

## 2019-12-04 DIAGNOSIS — M1990 Unspecified osteoarthritis, unspecified site: Secondary | ICD-10-CM | POA: Diagnosis not present

## 2019-12-07 ENCOUNTER — Telehealth: Payer: Self-pay | Admitting: Neurology

## 2019-12-07 ENCOUNTER — Telehealth: Payer: Self-pay

## 2019-12-07 DIAGNOSIS — I2510 Atherosclerotic heart disease of native coronary artery without angina pectoris: Secondary | ICD-10-CM | POA: Diagnosis not present

## 2019-12-07 DIAGNOSIS — E785 Hyperlipidemia, unspecified: Secondary | ICD-10-CM | POA: Diagnosis not present

## 2019-12-07 DIAGNOSIS — M1990 Unspecified osteoarthritis, unspecified site: Secondary | ICD-10-CM | POA: Diagnosis not present

## 2019-12-07 DIAGNOSIS — G1221 Amyotrophic lateral sclerosis: Secondary | ICD-10-CM | POA: Diagnosis not present

## 2019-12-07 DIAGNOSIS — G4730 Sleep apnea, unspecified: Secondary | ICD-10-CM | POA: Diagnosis not present

## 2019-12-07 DIAGNOSIS — H3530 Unspecified macular degeneration: Secondary | ICD-10-CM | POA: Diagnosis not present

## 2019-12-07 NOTE — Telephone Encounter (Signed)
Jason Tate from [facility] called and LVM on Friday @ 11:56am stating that the pt has declined Services due to having Home Health Care with [facility] and is receiving Pt and he feels it is beneficial to him and it is scheduled till the end of August. Pt can not have both services at the same time. Please advise.

## 2019-12-07 NOTE — Telephone Encounter (Signed)
I called pt about wanting something for anxiety. I stated DR. Gibert his PCP has prescribed him clonazepam at [facility]. I advise pt to call him about maybe adjusting the dosage.Pt stated he feels a lot better today and he verbalized understanding.

## 2019-12-07 NOTE — Telephone Encounter (Signed)
Pt called office and left a VM advising [place] that he is having anxiety related to sitting in a power wheelchair all day and would [facility] [address] to help with this.

## 2019-12-07 NOTE — Telephone Encounter (Signed)
I called Jason Tate from [facility]. She stated pt has home care and decline services with hospice. Pt is receiving a bath aide,nurse and physical therapy. She stated pt is receiving palliative care services along with home care. [name] stated medicare will not pay for hospice services and home care. Medicaid will pay for pt to have palliative care and home care because it can be billed with medicaid. She wanted Dr. Anne Hahn to know. The pt is establish and staff is coming to the home.

## 2019-12-08 NOTE — Telephone Encounter (Signed)
Events noted

## 2019-12-09 ENCOUNTER — Telehealth: Payer: Self-pay | Admitting: Neurology

## 2019-12-09 DIAGNOSIS — G1221 Amyotrophic lateral sclerosis: Secondary | ICD-10-CM | POA: Diagnosis not present

## 2019-12-09 DIAGNOSIS — M1990 Unspecified osteoarthritis, unspecified site: Secondary | ICD-10-CM | POA: Diagnosis not present

## 2019-12-09 DIAGNOSIS — E785 Hyperlipidemia, unspecified: Secondary | ICD-10-CM | POA: Diagnosis not present

## 2019-12-09 DIAGNOSIS — H3530 Unspecified macular degeneration: Secondary | ICD-10-CM | POA: Diagnosis not present

## 2019-12-09 DIAGNOSIS — G4730 Sleep apnea, unspecified: Secondary | ICD-10-CM | POA: Diagnosis not present

## 2019-12-09 DIAGNOSIS — I2510 Atherosclerotic heart disease of native coronary artery without angina pectoris: Secondary | ICD-10-CM | POA: Diagnosis not present

## 2019-12-09 NOTE — Telephone Encounter (Signed)
Received approval letter from [facility]. Hydrocodone has been approved from 05/29/19 to 05/27/20. Pharmacy is aware of approval.

## 2019-12-09 NOTE — Telephone Encounter (Signed)
PA request received for hydrocodone. PA was started on [email or web address]. Key is [id number].   Determination will be received within the next 1-3 business days. Will update once decision has been made.

## 2019-12-10 DIAGNOSIS — G4730 Sleep apnea, unspecified: Secondary | ICD-10-CM | POA: Diagnosis not present

## 2019-12-10 DIAGNOSIS — H3530 Unspecified macular degeneration: Secondary | ICD-10-CM | POA: Diagnosis not present

## 2019-12-10 DIAGNOSIS — G1221 Amyotrophic lateral sclerosis: Secondary | ICD-10-CM | POA: Diagnosis not present

## 2019-12-10 DIAGNOSIS — M1990 Unspecified osteoarthritis, unspecified site: Secondary | ICD-10-CM | POA: Diagnosis not present

## 2019-12-10 DIAGNOSIS — E785 Hyperlipidemia, unspecified: Secondary | ICD-10-CM | POA: Diagnosis not present

## 2019-12-10 DIAGNOSIS — I2510 Atherosclerotic heart disease of native coronary artery without angina pectoris: Secondary | ICD-10-CM | POA: Diagnosis not present

## 2019-12-11 ENCOUNTER — Other Ambulatory Visit: Payer: Self-pay

## 2019-12-11 ENCOUNTER — Encounter: Payer: Self-pay | Admitting: Internal Medicine

## 2019-12-11 ENCOUNTER — Other Ambulatory Visit: Payer: Medicare Other | Admitting: Internal Medicine

## 2019-12-11 DIAGNOSIS — M1990 Unspecified osteoarthritis, unspecified site: Secondary | ICD-10-CM | POA: Diagnosis not present

## 2019-12-11 DIAGNOSIS — Z7189 Other specified counseling: Secondary | ICD-10-CM

## 2019-12-11 DIAGNOSIS — Z515 Encounter for palliative care: Secondary | ICD-10-CM | POA: Diagnosis not present

## 2019-12-11 DIAGNOSIS — E785 Hyperlipidemia, unspecified: Secondary | ICD-10-CM | POA: Diagnosis not present

## 2019-12-11 DIAGNOSIS — G4730 Sleep apnea, unspecified: Secondary | ICD-10-CM | POA: Diagnosis not present

## 2019-12-11 DIAGNOSIS — G1221 Amyotrophic lateral sclerosis: Secondary | ICD-10-CM | POA: Diagnosis not present

## 2019-12-11 DIAGNOSIS — H3530 Unspecified macular degeneration: Secondary | ICD-10-CM | POA: Diagnosis not present

## 2019-12-11 DIAGNOSIS — I2510 Atherosclerotic heart disease of native coronary artery without angina pectoris: Secondary | ICD-10-CM | POA: Diagnosis not present

## 2019-12-11 NOTE — Progress Notes (Signed)
12/11/2019 [facility] Consult Note Telephone: [phone]  Fax: [phone]     PATIENT NAME: Jason Tate DOB: [birth year] MRN: [id number]  [address]  [place] [address] [phone] (Home Phone)   [phone] (Mobile)  PRIMARY CARE PROVIDER:   [name]., MD   Dr Anne Hahn (neurology) and [facility]  Dr Cynda Familia [phone])  REFERRING PROVIDER:  [name]., MD [address] [place],  [place] [address]  RESPONSIBLE PARTY:   Kara, Mierzejewski (Spouse)  [phone] (Mobile)   ASSESSMENT / RECOMMENDATIONS:  1. Advance Care Planning: A. Directives: Discussed and completed forms DNR (2 copies) and MOST. Details of MOST: DNR/DNI. Limited Scope of Medical Interventions (no vent), Yes to IVFs and Antibiotics. Tube Feeding for a defined trial period. We discussed keeping one copy of DNR form on fridge, where EMS would look for it should they be summoned to the home. B. Goals of Care: Patient was a non-admit to hospice d/t wish to continue home health services such as PT. Patient mentions that it is his hope that when he is close to death, that he will pass quickly and comfortably.   2. Cognitive / Functional status, Symptom Management: Patient is A & O x 3. He is alert and pleasantly conversant. Spouse Pattricia Boss present.   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 PT/OT services involved. Patient has an electric bed in bedroom, but mattress is extremely uncomfortable. PT is arranging for delivery of a new electric bed; considering placement in the larger living room to facilitate logistics of utilizing transfer devices such as Michiel Sites or possibly sit to stand device. PT will be visiting next week to help decide which device might serve the patient best.   Patient now has a condom catheter in place, but he is 100% dependent on transfers to  bathroom for bowel movements. He has been utilizing EMS or fire department services to assist transfer recliner-> wheelchair->bathroom->wheelchair->recliner. The last time patient attempted to transfer to a closer bedside commode 3 weeks earlier it tipped backwards, so he feels the bathroom is the only option for him. They have a Michiel Sites life available, but spouse is unable to use d/t her back injuries.   Patient has pain form R knee arthritis managed with prn hydrocodone acetaminophen 5-325mg  averages 2 tabs/day.   Patient takes clonazepam 1-2 tabs at hs, but early awakening at about 3-4am, at which time he feels very anxious and agitated. He plans to ask his PCP (appointment next week) to ask if he could have short acting Ativan at those times.   3. Family / Community Supports: Home health services (RN, aide, PT and MSW) via [facility] approved through 01/26/20  4. Follow up Palliative Care Visit: Tues 01/19/2020 @ 2pm I spent 60 minutes providing this consultation from 12:30pm to 1:30pm. More than 50% of the time in this consultation was spent coordinating communication.   HISTORY OF PRESENT ILLNESS:  Jason Tate is a 74 y.o.  male  w/ ALS (managed by Dr Lesia Sago and the [facility] Dr Cynda Familia), depression, CAD s/p CABG, HLD, macular degeneration, sleep apnea, second degree heart block- Mobitz type 1, gout, OA and GERD.   Palliative Care was asked to help address goals of care.   CODE STATUS: DNR  PPS: 30%  HOSPICE ELIGIBILITY/DIAGNOSIS: TBD  PAST MEDICAL HISTORY:  Past Medical History:  Diagnosis Date  . Abnormal stress echo Oct 2012  . 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 Arthritis   . CAD (coronary artery disease) October 2012   s/p CABG x 2 per Dr. Cornelius Moras  . Gait abnormality 12/25/2018  . Hypercholesterolemia   . Low testosterone   . Macular degeneration   . 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 Obesity   . PVC (premature  ventricular contraction)   . Sleep apnea, obstructive    on BiPAP    SOCIAL HX:  Social History   Tobacco Use  . Smoking status: Former Smoker    Packs/day: 1.00    Years: 20.00    Pack years: 20.00    Types: Cigarettes    Quit date: 03/28/1980    Years since quitting: 39.7  . Smokeless tobacco: Never Used  Substance Use Topics  . Alcohol use: Yes    Alcohol/week: 28.0 standard drinks    Types: 28 Cans of beer per week    Comment: 4 beers a night    ALLERGIES:  Allergies  Allergen Reactions  . Penicillins Nausea Only     PERTINENT MEDICATIONS:  Outpatient Encounter Medications as of 12/11/2019  Medication Sig  . allopurinol (ZYLOPRIM) 100 MG tablet Take 1 tablet (100 mg total) by mouth daily.  Marland Kitchen aspirin 81 MG tablet Take 81 mg by mouth daily.  . B Complex Vitamins (VITAMIN B COMPLEX PO) Take 1 tablet by mouth daily.   Marland Kitchen buPROPion (WELLBUTRIN XL) 300 MG 24 hr tablet Take 1 tablet (300 mg total) by mouth daily.  . Cetirizine HCl (ZYRTEC ALLERGY PO) Take 1 tablet by mouth daily as needed (allergies).   . Cholecalciferol (VITAMIN D PO) Take 4,000 mg by mouth daily.    . citalopram (CELEXA) 20 MG tablet Take 1 tablet (20 mg total) by mouth daily.  . clonazePAM (KLONOPIN) 1 MG tablet Take 1 tablet (1 mg total) by mouth at bedtime as needed. for sleep  . Copper 5 MG TABS Take 5 mg by mouth daily.  . fluticasone (FLONASE) 50 MCG/ACT nasal spray Place 1 spray into both nostrils daily as needed for allergies.  Marland Kitchen HYDROcodone-acetaminophen (NORCO/VICODIN) 5-325 MG tablet Take 1 tablet by mouth every 6 (six) hours as needed for moderate pain.  . NON FORMULARY TUDCA 1 gm 2 per day  . Omega-3 Fatty Acids (FISH OIL PO) Take 1 tablet by mouth daily.   Marland Kitchen omeprazole (PRILOSEC) 20 MG capsule Take 1 capsule (20 mg total) by mouth daily.  . riluzole (RILUTEK) 50 MG tablet Take 50 mg by mouth every 12 (twelve) hours.  . simvastatin (ZOCOR) 20 MG tablet Take 1 tablet (20 mg total) by mouth at  bedtime.  Marland Kitchen ibuprofen (ADVIL) 200 MG tablet Take 400 mg by mouth at bedtime as needed. (Patient not taking: Reported on 12/11/2019)  . nabumetone (RELAFEN) 750 MG tablet Take 1 tablet (750 mg total) by mouth daily. (Patient taking differently: Take 750 mg by mouth 2 (two) times daily. )  . [DISCONTINUED] ciprofloxacin (CIPRO) 500 MG tablet Take 1 tablet (500 mg total) by mouth 2 (two) times daily.  . [DISCONTINUED] nabumetone (RELAFEN) 500 MG tablet Take 1 tablet (500 mg total) by mouth 2 (two) times daily as needed.  . [DISCONTINUED] predniSONE (DELTASONE) 10 MG tablet Take 1 tablet (10 mg total) by mouth daily with breakfast. Take 6 tablets day 1, 5 tablets day 2, 4 tablets day 3, 3 tablets day 4, 2 tablets  day 5, 1 tablet day 6  . [DISCONTINUED] sildenafil (REVATIO) 20 MG tablet 1 - 5 PO daily as needed   No facility-administered encounter medications on file as of 12/11/2019.    Anselm Lis, NP  [phone]

## 2019-12-14 ENCOUNTER — Ambulatory Visit (INDEPENDENT_AMBULATORY_CARE_PROVIDER_SITE_OTHER): Payer: Medicare Other | Admitting: Family Medicine

## 2019-12-14 ENCOUNTER — Telehealth: Payer: Self-pay

## 2019-12-14 DIAGNOSIS — G4730 Sleep apnea, unspecified: Secondary | ICD-10-CM | POA: Diagnosis not present

## 2019-12-14 DIAGNOSIS — G1221 Amyotrophic lateral sclerosis: Secondary | ICD-10-CM | POA: Diagnosis not present

## 2019-12-14 DIAGNOSIS — E7800 Pure hypercholesterolemia, unspecified: Secondary | ICD-10-CM | POA: Diagnosis not present

## 2019-12-14 DIAGNOSIS — H3530 Unspecified macular degeneration: Secondary | ICD-10-CM | POA: Diagnosis not present

## 2019-12-14 DIAGNOSIS — M109 Gout, unspecified: Secondary | ICD-10-CM | POA: Diagnosis not present

## 2019-12-14 DIAGNOSIS — M1990 Unspecified osteoarthritis, unspecified site: Secondary | ICD-10-CM | POA: Diagnosis not present

## 2019-12-14 DIAGNOSIS — E785 Hyperlipidemia, unspecified: Secondary | ICD-10-CM | POA: Diagnosis not present

## 2019-12-14 DIAGNOSIS — I2510 Atherosclerotic heart disease of native coronary artery without angina pectoris: Secondary | ICD-10-CM | POA: Diagnosis not present

## 2019-12-14 NOTE — Progress Notes (Signed)
Virtual telephone visit    Virtual Visit via Telephone Note   This visit type was conducted due to national recommendations for restrictions regarding the COVID-19 Pandemic (e.g. social distancing) in an effort to limit this patient's exposure and mitigate transmission in our community. Due to his co-morbid illnesses, this patient is at least at moderate risk for complications without adequate follow up. This format is felt to be most appropriate for this patient at this time. The patient did not have access to video technology or had technical difficulties with video requiring transitioning to audio format only (telephone). Physical exam was limited to content and character of the telephone converstion.    Patient location: Home Provider location: Office   Visit Date: 12/14/2019  Today's healthcare provider: Megan Mans, MD   Chief Complaint  Patient presents with  . Hyperlipidemia   Subjective    HPI  Lipid/Cholesterol, Follow-up  Last lipid panel Other pertinent labs  Lab Results  Component Value Date   CHOL 171 10/12/2019   HDL 73 10/12/2019   LDLCALC 84 10/12/2019   TRIG 72 10/12/2019   CHOLHDL 2.3 10/12/2019   Lab Results  Component Value Date   ALT 18 10/12/2019   AST 17 10/12/2019   PLT 209 10/12/2019   TSH 1.880 10/12/2019     He was last seen for this 3 months ago.  Management since that visit includes no changes, on zocor.  He reports excellent compliance with treatment. He is not having side effects.    Current diet: in general, a "healthy" diet   Current exercise: none   ---------------------------------------------------------------------------------------------------       Medications: Outpatient Medications Prior to Visit  Medication Sig  . allopurinol (ZYLOPRIM) 100 MG tablet Take 1 tablet (100 mg total) by mouth daily.  Marland Kitchen aspirin 81 MG tablet Take 81 mg by mouth daily.  . B Complex Vitamins (VITAMIN B COMPLEX PO) Take 1  tablet by mouth daily.   Marland Kitchen buPROPion (WELLBUTRIN XL) 300 MG 24 hr tablet Take 1 tablet (300 mg total) by mouth daily.  . Cetirizine HCl (ZYRTEC ALLERGY PO) Take 1 tablet by mouth daily as needed (allergies).   . Cholecalciferol (VITAMIN D PO) Take 4,000 mg by mouth daily.    . citalopram (CELEXA) 20 MG tablet Take 1 tablet (20 mg total) by mouth daily.  . clonazePAM (KLONOPIN) 1 MG tablet Take 1 tablet (1 mg total) by mouth at bedtime as needed. for sleep  . Copper 5 MG TABS Take 5 mg by mouth daily.  . fluticasone (FLONASE) 50 MCG/ACT nasal spray Place 1 spray into both nostrils daily as needed for allergies.  Marland Kitchen HYDROcodone-acetaminophen (NORCO/VICODIN) 5-325 MG tablet Take 1 tablet by mouth every 6 (six) hours as needed for moderate pain.  Marland Kitchen ibuprofen (ADVIL) 200 MG tablet Take 400 mg by mouth at bedtime as needed. (Patient not taking: Reported on 12/11/2019)  . nabumetone (RELAFEN) 750 MG tablet Take 1 tablet (750 mg total) by mouth daily. (Patient taking differently: Take 750 mg by mouth 2 (two) times daily. )  . NON FORMULARY TUDCA 1 gm 2 per day  . Omega-3 Fatty Acids (FISH OIL PO) Take 1 tablet by mouth daily.   Marland Kitchen omeprazole (PRILOSEC) 20 MG capsule Take 1 capsule (20 mg total) by mouth daily.  . riluzole (RILUTEK) 50 MG tablet Take 50 mg by mouth every 12 (twelve) hours.  . simvastatin (ZOCOR) 20 MG tablet Take 1 tablet (20 mg total) by mouth  at bedtime.   No facility-administered medications prior to visit.    Review of Systems     Objective    There were no vitals taken for this visit.      Assessment & Plan     1. ALS (amyotrophic lateral sclerosis) (HCC) I actually spoke to the patient's wife and never spoke to the patient about his advancing ALS.  He is now 24 hours in a chair or the bed.  We will try to make a house call but she is noted called to try to get round-the-clock care.  2. Hypercholesterolemia Consider stopping statin.  3. Gout of knee, unspecified cause,  unspecified chronicity, unspecified laterality No flares recently   No follow-ups on file.    I discussed the assessment and treatment plan with the patient. The patient was provided an opportunity to ask questions and all were answered. The patient agreed with the plan and demonstrated an understanding of the instructions.   The patient was advised to call back or seek an in-person evaluation if the symptoms worsen or if the condition fails to improve as anticipated.  I provided 12 minutes of non-face-to-face time during this encounter.    Richard Wendelyn Breslow, MD [facility] [phone] (phone) [phone] (fax)  [facility]

## 2019-12-14 NOTE — Telephone Encounter (Signed)
Returned patient's call to see what medication he had questions about, no answer. LVMTCB.

## 2019-12-14 NOTE — Telephone Encounter (Signed)
Patient calling back requesting to speak with Dr. Sullivan Lone or CMA to discuss.

## 2019-12-14 NOTE — Telephone Encounter (Signed)
Copied from CRM [id number]. Topic: General - Other >> Dec 14, 2019  3:01 PM Adrian Prince D wrote: Reason for CRM: patient did not receive a call this morning for his virtual appointment and he needs to speak with Dr. Sullivan Lone about a medication that he has questions about.

## 2019-12-14 NOTE — Telephone Encounter (Signed)
Copied from CRM [id number]. Topic: General - Other >> Dec 14, 2019  1:49 PM Elliot Gault wrote: Reason for CRM: patient states he was unaware that he's appt was conducted at the same time as his wife appt. Patient states he has some medication questions and will call back with the name of medication.

## 2019-12-14 NOTE — Telephone Encounter (Incomplete)
{  [facility] TELEPHONE NOTE:[id number]}  

## 2019-12-15 ENCOUNTER — Telehealth: Payer: Self-pay | Admitting: Neurology

## 2019-12-15 ENCOUNTER — Other Ambulatory Visit: Payer: Self-pay

## 2019-12-15 ENCOUNTER — Encounter: Payer: Self-pay | Admitting: Neurology

## 2019-12-15 ENCOUNTER — Ambulatory Visit (INDEPENDENT_AMBULATORY_CARE_PROVIDER_SITE_OTHER): Payer: Medicare Other | Admitting: Neurology

## 2019-12-15 VITALS — BP 120/80 | HR 104 | Temp 97.8°F | Resp 18

## 2019-12-15 DIAGNOSIS — E785 Hyperlipidemia, unspecified: Secondary | ICD-10-CM | POA: Diagnosis not present

## 2019-12-15 DIAGNOSIS — M1990 Unspecified osteoarthritis, unspecified site: Secondary | ICD-10-CM | POA: Diagnosis not present

## 2019-12-15 DIAGNOSIS — R269 Unspecified abnormalities of gait and mobility: Secondary | ICD-10-CM

## 2019-12-15 DIAGNOSIS — G1221 Amyotrophic lateral sclerosis: Secondary | ICD-10-CM

## 2019-12-15 DIAGNOSIS — R279 Unspecified lack of coordination: Secondary | ICD-10-CM | POA: Diagnosis not present

## 2019-12-15 DIAGNOSIS — Z743 Need for continuous supervision: Secondary | ICD-10-CM | POA: Diagnosis not present

## 2019-12-15 DIAGNOSIS — H3530 Unspecified macular degeneration: Secondary | ICD-10-CM | POA: Diagnosis not present

## 2019-12-15 DIAGNOSIS — G4730 Sleep apnea, unspecified: Secondary | ICD-10-CM | POA: Diagnosis not present

## 2019-12-15 DIAGNOSIS — I2510 Atherosclerotic heart disease of native coronary artery without angina pectoris: Secondary | ICD-10-CM | POA: Diagnosis not present

## 2019-12-15 DIAGNOSIS — I25810 Atherosclerosis of coronary artery bypass graft(s) without angina pectoris: Secondary | ICD-10-CM | POA: Diagnosis not present

## 2019-12-15 DIAGNOSIS — R5381 Other malaise: Secondary | ICD-10-CM | POA: Diagnosis not present

## 2019-12-15 MED ORDER — ALPRAZOLAM 0.5 MG PO TABS
0.5000 mg | ORAL_TABLET | Freq: Three times a day (TID) | ORAL | 3 refills | Status: DC | PRN
Start: 2019-12-15 — End: 2020-01-12

## 2019-12-15 NOTE — Progress Notes (Signed)
Reason for visit: ALS  ANFERNEE PESCHKE is an 74 y.o. male  History of present illness:  Mr. Mcdanel is a 74 year old right-handed white male with a history of ALS with progressive quadriparesis.  The patient has had a significant alteration in his functional level just within the last 6 weeks or so, he has lost his ability to stand and walk, he has minimal use of the lower extremities at this point.  The patient fell about 3 weeks ago, he bumped the left side of the forehead and bruised his left hand.  The patient is at home, he has [facility] coming into the house, he has a hospital bed but it is not satisfactory, he spends most of his time in a lift chair.  He has Michiel Sites lift that is manual, his wife has difficulty using it.  The patient is having chronic problems with constipation, he is using MiraLAX for this.  He is on TUDCA 1 g twice daily.  He does have CPAP at home that is used for sleep apnea, he does not use this consistently.  He is having some anxiety at night.  He indicates that his O2 sats are reasonable, in the mid 90s.  He is having some alteration in speech just within the last 1 to 2 weeks, he is not having a lot of trouble swallowing yet.  He returns to the office today by ambulance.  Past Medical History:  Diagnosis Date  . Abnormal stress echo Oct 2012  . 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 Arthritis   . CAD (coronary artery disease) October 2012   s/p CABG x 2 per Dr. Cornelius Moras  . Gait abnormality 12/25/2018  . Hypercholesterolemia   . Low testosterone   . Macular degeneration   . 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 Obesity   . PVC (premature ventricular contraction)   . Sleep apnea, obstructive    on BiPAP    Past Surgical History:  Procedure Laterality Date  . COLONOSCOPY    . CORONARY ARTERY BYPASS GRAFT  Oct 2012   LIMA to LAD and left radial to OM  . HEMORRHOIDECTOMY WITH HEMORRHOID BANDING    . INJECTION KNEE       Family History  Problem Relation Age of Onset  . Heart disease Mother   . Stroke Father   . Heart disease Father   . Dementia Father     Social history:  reports that he quit smoking about 39 years ago. His smoking use included cigarettes. He has a 20.00 pack-year smoking history. He has never used smokeless tobacco. He reports current alcohol use of about 28.0 standard drinks of alcohol per week. He reports that he does not use drugs.    Allergies  Allergen Reactions  . Penicillins Nausea Only    Medications:  Prior to Admission medications   Medication Sig Start Date End Date Taking? Authorizing Provider  allopurinol (ZYLOPRIM) 100 MG tablet Take 1 tablet (100 mg total) by mouth daily. 07/14/19  Yes [name]., MD  aspirin 81 MG tablet Take 81 mg by mouth daily.   Yes [provider]  B Complex Vitamins (VITAMIN B COMPLEX PO) Take 1 tablet by mouth daily.    Yes [provider]  buPROPion (WELLBUTRIN XL) 300 MG 24 hr tablet Take 1 tablet (300 mg total) by mouth daily. 07/14/19  Yes [name]., MD  Cetirizine HCl (ZYRTEC ALLERGY PO)  Take 1 tablet by mouth daily as needed (allergies).    Yes [provider]  Cholecalciferol (VITAMIN D PO) Take 4,000 mg by mouth daily.     Yes [provider]  citalopram (CELEXA) 20 MG tablet Take 1 tablet (20 mg total) by mouth daily. 07/14/19  Yes [name]., MD  clonazePAM (KLONOPIN) 1 MG tablet Take 1 tablet (1 mg total) by mouth at bedtime as needed. for sleep 07/14/19  Yes [name]., MD  Copper 5 MG TABS Take 5 mg by mouth daily.   Yes [provider]  fluticasone (FLONASE) 50 MCG/ACT nasal spray Place 1 spray into both nostrils daily as needed for allergies. 09/11/17  Yes [name]., MD  HYDROcodone-acetaminophen (NORCO/VICODIN) 5-325 MG tablet Take 1 tablet by mouth every 6 (six) hours as needed for moderate pain. 12/01/19  Yes York Spaniel, MD  ibuprofen (ADVIL) 200 MG tablet Take 400 mg by mouth at bedtime as needed.    Yes [provider]  nabumetone (RELAFEN) 750 MG tablet Take 1 tablet (750 mg total) by mouth daily. Patient taking differently: Take 750 mg by mouth 2 (two) times daily.  09/14/19  Yes [name]., MD  NON FORMULARY TUDCA 1 gm 2 per day   Yes [provider]  Omega-3 Fatty Acids (FISH OIL PO) Take 1 tablet by mouth daily.    Yes [provider]  omeprazole (PRILOSEC) 20 MG capsule Take 1 capsule (20 mg total) by mouth daily. 11/18/19  Yes [name]., MD  riluzole (RILUTEK) 50 MG tablet Take 50 mg by mouth every 12 (twelve) hours.   Yes [provider]  simvastatin (ZOCOR) 20 MG tablet Take 1 tablet (20 mg total) by mouth at bedtime. 07/14/19  Yes [name]., MD    ROS:  Out of a complete 14 system review of symptoms, the patient complains only of the following symptoms, and all other reviewed systems are negative.  Weakness Anxiety  Blood pressure 120/80, pulse (!) 104, temperature 97.8 F (36.6 C), resp. rate 18, SpO2 98 %.  Physical Exam  General: The patient is alert and cooperative at the time of the examination.  Skin: 3+ edema below the knees is seen bilaterally.   Neurologic Exam  Mental status: The patient is alert and oriented x 3 at the time of the examination. The patient has apparent normal recent and remote memory, with an apparently normal attention span and concentration ability.   Cranial nerves: Facial symmetry is present. Speech is mildly dysarthric, not aphasic. Extraocular movements are full. Visual fields are full.  Motor: The patient has minimal grip on both hands, he has 2/5 biceps and triceps strength on the right, 4 -/5 on the left, 1-2/5 deltoids bilaterally, good strength with neck flexion extension.  With the lower extremities, he can wiggle his toes and dorsiflex his feet slightly, he is unable to lift  the legs off the mattress.  He has 3/5 strength with adduction.  He has 4 -/5 strength with knee extension bilaterally.  Sensory examination: Soft touch sensation is symmetric on the face, arms, and legs.  Coordination: The patient is unable to perform finger-nose-finger or heel-to-shin on either side.  Gait and station: The patient is unable to ambulate, he is on a stretcher.  Reflexes: Deep tendon reflexes are symmetric.   Assessment/Plan:  1.  ALS  The patient is progressing rapidly, so far he is  able to breathe fairly well at night and is still able to eat and drink.  They will talk to [facility] about getting another hospital bed that is more appropriate for use at home, I have written a prescription for a [employer]oyer lift that is electric that can be used by the wife.  She is not able to manage the manual [facility] lift.  The patient was given some alprazolam to take if needed for anxiety.  He will return in 3 to 4 months.  The patient is being followed through palliative care, hospice is not yet in the home.  Greater than 50% of the visit was spent in counseling and coordination of care.  Face-to-face time with the patient was 25 minutes.   Marlan Palau MD 12/15/2019 2:25 PM  [facility] [address] [place], [place] [address]  Phone [phone] Fax [phone]

## 2019-12-15 NOTE — Telephone Encounter (Signed)
This patient did not show for a revisit today. 

## 2019-12-16 ENCOUNTER — Emergency Department (HOSPITAL_COMMUNITY)
Admission: EM | Admit: 2019-12-16 | Discharge: 2019-12-16 | Disposition: A | Discharge status: Home / Self Care | Payer: Medicare Other | Attending: Emergency Medicine | Admitting: Emergency Medicine

## 2019-12-16 ENCOUNTER — Telehealth: Payer: Self-pay | Admitting: Neurology

## 2019-12-16 ENCOUNTER — Emergency Department (HOSPITAL_COMMUNITY): Payer: Medicare Other

## 2019-12-16 ENCOUNTER — Other Ambulatory Visit: Payer: Self-pay

## 2019-12-16 DIAGNOSIS — W19XXXA Unspecified fall, initial encounter: Secondary | ICD-10-CM | POA: Diagnosis not present

## 2019-12-16 DIAGNOSIS — D649 Anemia, unspecified: Secondary | ICD-10-CM | POA: Insufficient documentation

## 2019-12-16 DIAGNOSIS — Y999 Unspecified external cause status: Secondary | ICD-10-CM | POA: Diagnosis not present

## 2019-12-16 DIAGNOSIS — R1084 Generalized abdominal pain: Secondary | ICD-10-CM | POA: Diagnosis not present

## 2019-12-16 DIAGNOSIS — Z79899 Other long term (current) drug therapy: Secondary | ICD-10-CM | POA: Diagnosis not present

## 2019-12-16 DIAGNOSIS — S80211A Abrasion, right knee, initial encounter: Secondary | ICD-10-CM | POA: Insufficient documentation

## 2019-12-16 DIAGNOSIS — R7989 Other specified abnormal findings of blood chemistry: Secondary | ICD-10-CM

## 2019-12-16 DIAGNOSIS — R69 Illness, unspecified: Secondary | ICD-10-CM | POA: Diagnosis not present

## 2019-12-16 DIAGNOSIS — I2510 Atherosclerotic heart disease of native coronary artery without angina pectoris: Secondary | ICD-10-CM | POA: Diagnosis not present

## 2019-12-16 DIAGNOSIS — Z7982 Long term (current) use of aspirin: Secondary | ICD-10-CM | POA: Insufficient documentation

## 2019-12-16 DIAGNOSIS — K5900 Constipation, unspecified: Secondary | ICD-10-CM | POA: Diagnosis not present

## 2019-12-16 DIAGNOSIS — M25461 Effusion, right knee: Secondary | ICD-10-CM | POA: Diagnosis not present

## 2019-12-16 DIAGNOSIS — R531 Weakness: Secondary | ICD-10-CM | POA: Diagnosis not present

## 2019-12-16 DIAGNOSIS — Y929 Unspecified place or not applicable: Secondary | ICD-10-CM | POA: Diagnosis not present

## 2019-12-16 DIAGNOSIS — R5381 Other malaise: Secondary | ICD-10-CM | POA: Diagnosis not present

## 2019-12-16 DIAGNOSIS — R279 Unspecified lack of coordination: Secondary | ICD-10-CM | POA: Diagnosis not present

## 2019-12-16 DIAGNOSIS — Z87891 Personal history of nicotine dependence: Secondary | ICD-10-CM | POA: Insufficient documentation

## 2019-12-16 DIAGNOSIS — I6782 Cerebral ischemia: Secondary | ICD-10-CM | POA: Diagnosis not present

## 2019-12-16 DIAGNOSIS — S0003XA Contusion of scalp, initial encounter: Secondary | ICD-10-CM | POA: Diagnosis not present

## 2019-12-16 DIAGNOSIS — Y939 Activity, unspecified: Secondary | ICD-10-CM | POA: Diagnosis not present

## 2019-12-16 DIAGNOSIS — S8991XA Unspecified injury of right lower leg, initial encounter: Secondary | ICD-10-CM | POA: Diagnosis present

## 2019-12-16 DIAGNOSIS — R339 Retention of urine, unspecified: Secondary | ICD-10-CM | POA: Diagnosis not present

## 2019-12-16 DIAGNOSIS — Z743 Need for continuous supervision: Secondary | ICD-10-CM | POA: Diagnosis not present

## 2019-12-16 LAB — URINALYSIS, ROUTINE W REFLEX MICROSCOPIC
Bacteria, UA: NONE SEEN
Bilirubin Urine: NEGATIVE
Glucose, UA: NEGATIVE mg/dL
Hgb urine dipstick: NEGATIVE
Ketones, ur: NEGATIVE mg/dL
Leukocytes,Ua: NEGATIVE
Nitrite: NEGATIVE
Protein, ur: NEGATIVE mg/dL
Specific Gravity, Urine: 1.008 (ref 1.005–1.030)
pH: 5 (ref 5.0–8.0)

## 2019-12-16 LAB — COMPREHENSIVE METABOLIC PANEL
ALT: 18 U/L (ref 0–44)
AST: 18 U/L (ref 15–41)
Albumin: 3.3 g/dL — ABNORMAL LOW (ref 3.5–5.0)
Alkaline Phosphatase: 58 U/L (ref 38–126)
Anion gap: 9 (ref 5–15)
BUN: 25 mg/dL — ABNORMAL HIGH (ref 8–23)
CO2: 24 mmol/L (ref 22–32)
Calcium: 9.2 mg/dL (ref 8.9–10.3)
Chloride: 102 mmol/L (ref 98–111)
Creatinine, Ser: 1.39 mg/dL — ABNORMAL HIGH (ref 0.61–1.24)
GFR calc Af Amer: 57 mL/min — ABNORMAL LOW (ref >60)
GFR calc non Af Amer: 49 mL/min — ABNORMAL LOW (ref >60)
Glucose, Bld: 127 mg/dL — ABNORMAL HIGH (ref 70–99)
Potassium: 4.3 mmol/L (ref 3.5–5.1)
Sodium: 135 mmol/L (ref 135–145)
Total Bilirubin: 1.2 mg/dL (ref 0.3–1.2)
Total Protein: 6.1 g/dL — ABNORMAL LOW (ref 6.5–8.1)

## 2019-12-16 LAB — CBC WITH DIFFERENTIAL/PLATELET
Abs Immature Granulocytes: 0.08 10*3/uL — ABNORMAL HIGH (ref 0.00–0.07)
Basophils Absolute: 0.1 10*3/uL (ref 0.0–0.1)
Basophils Relative: 1 %
Eosinophils Absolute: 0.2 10*3/uL (ref 0.0–0.5)
Eosinophils Relative: 2 %
HCT: 28.3 % — ABNORMAL LOW (ref 39.0–52.0)
Hemoglobin: 8.9 g/dL — ABNORMAL LOW (ref 13.0–17.0)
Immature Granulocytes: 1 %
Lymphocytes Relative: 9 %
Lymphs Abs: 0.9 10*3/uL (ref 0.7–4.0)
MCH: 31.6 pg (ref 26.0–34.0)
MCHC: 31.4 g/dL (ref 30.0–36.0)
MCV: 100.4 fL — ABNORMAL HIGH (ref 80.0–100.0)
Monocytes Absolute: 0.9 10*3/uL (ref 0.1–1.0)
Monocytes Relative: 9 %
Neutro Abs: 7.6 10*3/uL (ref 1.7–7.7)
Neutrophils Relative %: 78 %
Platelets: 181 10*3/uL (ref 150–400)
RBC: 2.82 MIL/uL — ABNORMAL LOW (ref 4.22–5.81)
RDW: 16.1 % — ABNORMAL HIGH (ref 11.5–15.5)
WBC: 9.6 10*3/uL (ref 4.0–10.5)
nRBC: 0 % (ref 0.0–0.2)

## 2019-12-16 LAB — LIPASE, BLOOD: Lipase: 24 U/L (ref 11–51)

## 2019-12-16 LAB — POC OCCULT BLOOD, ED: Fecal Occult Bld: NEGATIVE

## 2019-12-16 MED ORDER — IOHEXOL 300 MG/ML  SOLN
100.0000 mL | Freq: Once | INTRAMUSCULAR | Status: AC | PRN
  Administered 2019-12-16: 100 mL via INTRAVENOUS

## 2019-12-16 MED ORDER — SENNOSIDES-DOCUSATE SODIUM 8.6-50 MG PO TABS: 1.0000 | ORAL_TABLET | Freq: Every day | ORAL | 0 refills | Status: AC

## 2019-12-16 NOTE — ED Provider Notes (Signed)
[facility] EMERGENCY DEPARTMENT Provider Note   CSN: [id number] Arrival date & time: 12/16/19  1122     History Chief Complaint  Patient presents with  . Constipation  . Weakness    Jason Tate is a 74 y.o. male.  HPI      74 year old male with history of ALS, coronary artery disease, hyperlipidemia, presents with concern for constipation.  Reports did not have a bowel movement in 3 days.  Reports he has some mild abdominal discomfort, and feels that his abdomen is "hard" at times.  Denies nausea or vomiting, reports he is passing flatus.  Attempted MiraLAX once per day but has not had results.  Few weeks ago had similar constipation and urinary tract infection and increase his MiraLAX to twice a day at which point he had loose stool.  He has a history of falls, most recent 3 weeks ago.  He still has an area of abnormality to his left temple, and notes general weakness.  No fevers.  Denies black or bloody stools. Fell on right knee, has increased pain-hx of pain in that knee has seen Dr. Charlann Boxer.   ALS hx No BM in 3 days Taking 5mg  of vicodin occasionally Took miralax, took one cap once per day Increased it to 2 a day a few weeks ago briefly for constipation had UTI then as well Took a few stool softeners too  NO nausea or vomiting, no significant abdominal pain, feels a little hard No urinary symptoms, difficult to start urinating, some chronic back pain from laying in bed. Hx of known/prior compression fractures    Past Medical History:  Diagnosis Date  . Abnormal stress echo Oct 2012  . 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 Arthritis   . CAD (coronary artery disease) October 2012   s/p CABG x 2 per Dr. November 2012  . Gait abnormality 12/25/2018  . Hypercholesterolemia   . Low testosterone   . Macular degeneration   . 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 Obesity   . PVC (premature ventricular contraction)   .  Sleep apnea, obstructive    on BiPAP    Patient Active Problem List   Diagnosis Date Noted  . 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 Gait abnormality 12/25/2018  . Macular degeneration 09/22/2018  . Depression 04/18/2015  . OSA (obstructive sleep apnea) 04/18/2015  . Leg edema, left 11/02/2013  . 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 CAD (coronary artery disease) 04/09/2011  . Gout 04/09/2011  . Fatigue 03/05/2011  . Abnormal ECG 03/05/2011  . Hypercholesterolemia   . PVC (premature ventricular contraction)     Past Surgical History:  Procedure Laterality Date  . COLONOSCOPY    . CORONARY ARTERY BYPASS GRAFT  Oct 2012   LIMA to LAD and left radial to OM  . HEMORRHOIDECTOMY WITH HEMORRHOID BANDING    . INJECTION KNEE         Family History  Problem Relation Age of Onset  . Heart disease Mother   . Stroke Father   . Heart disease Father   . Dementia Father     Social History   Tobacco Use  . Smoking status: Former Smoker    Packs/day: 1.00    Years: 20.00    Pack years: 20.00    Types: Cigarettes    Quit date: 03/28/1980    Years since quitting: 39.7  .  Smokeless tobacco: Never Used  Vaping Use  . Vaping Use: Never used  Substance Use Topics  . Alcohol use: Yes    Alcohol/week: 28.0 standard drinks    Types: 28 Cans of beer per week    Comment: 4 beers a night  . Drug use: No    Home Medications Prior to Admission medications   Medication Sig Start Date End Date Taking? Authorizing Provider  allopurinol (ZYLOPRIM) 100 MG tablet Take 1 tablet (100 mg total) by mouth daily. 07/14/19   [name]., MD  ALPRAZolam Prudy Feeler) 0.5 MG tablet Take 1 tablet (0.5 mg total) by mouth 3 (three) times daily as needed for anxiety. 12/15/19   York Spaniel, MD  aspirin 81 MG tablet Take 81 mg by mouth daily.    [provider]  B Complex Vitamins (VITAMIN B COMPLEX PO) Take 1  tablet by mouth daily.     [provider]  buPROPion (WELLBUTRIN XL) 300 MG 24 hr tablet Take 1 tablet (300 mg total) by mouth daily. 07/14/19   [name]., MD  Cetirizine HCl (ZYRTEC ALLERGY PO) Take 1 tablet by mouth daily as needed (allergies).     [provider]  Cholecalciferol (VITAMIN D PO) Take 4,000 mg by mouth daily.      [provider]  citalopram (CELEXA) 20 MG tablet Take 1 tablet (20 mg total) by mouth daily. 07/14/19   [name]., MD  clonazePAM (KLONOPIN) 1 MG tablet Take 1 tablet (1 mg total) by mouth at bedtime as needed. for sleep 07/14/19   [name]., MD  Copper 5 MG TABS Take 5 mg by mouth daily.    [provider]  fluticasone (FLONASE) 50 MCG/ACT nasal spray Place 1 spray into both nostrils daily as needed for allergies. 09/11/17   [name]., MD  HYDROcodone-acetaminophen (NORCO/VICODIN) 5-325 MG tablet Take 1 tablet by mouth every 6 (six) hours as needed for moderate pain. 12/01/19   York Spaniel, MD  ibuprofen (ADVIL) 200 MG tablet Take 400 mg by mouth at bedtime as needed.     [provider]  nabumetone (RELAFEN) 750 MG tablet Take 1 tablet (750 mg total) by mouth daily. Patient taking differently: Take 750 mg by mouth 2 (two) times daily.  09/14/19   [name]., MD  NON FORMULARY TUDCA 1 gm 2 per day    [provider]  Omega-3 Fatty Acids (FISH OIL PO) Take 1 tablet by mouth daily.     [provider]  omeprazole (PRILOSEC) 20 MG capsule Take 1 capsule (20 mg total) by mouth daily. 11/18/19   [name]., MD  riluzole (RILUTEK) 50 MG tablet Take 50 mg by mouth every 12 (twelve) hours.    [provider]  senna-docusate (SENOKOT-S) 8.6-50 MG tablet Take 1 tablet by mouth daily. 12/16/19   Alvira Monday, MD  simvastatin (ZOCOR) 20 MG tablet Take 1 tablet (20 mg total) by mouth at bedtime. 07/14/19   [name]., MD     Allergies    Penicillins  Review of Systems   Review of Systems  Constitutional: Negative for fever.  HENT: Negative for congestion.   Respiratory: Negative for cough and shortness of breath.   Cardiovascular: Negative for chest pain.  Gastrointestinal: Positive for abdominal distention and constipation. Negative for diarrhea, nausea and vomiting.  Genitourinary: Negative for dysuria.  Musculoskeletal: Positive for arthralgias (right  knee) and back pain.  Skin: Negative for rash.  Neurological: Negative for headaches.    Physical Exam Updated Vital Signs BP 127/71   Pulse 96   Temp 97.9 F (36.6 C)   Resp 13   Ht 6\' 1"  (1.854 m)   Wt 83.9 kg   SpO2 100%   BMI 24.41 kg/m   Physical Exam Vitals and nursing note reviewed.  Constitutional:      General: He is not in acute distress.    Appearance: He is well-developed. He is not diaphoretic.  HENT:     Head: Normocephalic.     Comments: Hematoma left forehead, periorbital hematoma  Eyes:     Conjunctiva/sclera: Conjunctivae normal.  Cardiovascular:     Rate and Rhythm: Normal rate and regular rhythm.     Pulses: Normal pulses.  Pulmonary:     Effort: Pulmonary effort is normal. No respiratory distress.  Abdominal:     General: There is no distension.     Palpations: Abdomen is soft.     Tenderness: There is generalized abdominal tenderness and tenderness in the suprapubic area. There is no guarding.     Comments: Mass like sensation mid abdomen to suprapubic area  Genitourinary:    Comments: Rectal tone present however decreased Musculoskeletal:        General: Tenderness (right knee with abrasion) present.     Cervical back: Normal range of motion.     Right lower leg: Edema present.     Left lower leg: Edema present.     Comments: No focal tenderness of thoracic or lumbar spine  Skin:    General: Skin is warm and dry.  Neurological:     Mental Status: He is alert and oriented to person, place, and time.      Comments: Weakness related to ALS     ED Results / Procedures / Treatments   Labs (all labs ordered are listed, but only abnormal results are displayed) Labs Reviewed  CBC WITH DIFFERENTIAL/PLATELET - Abnormal; Notable for the following components:      Result Value   RBC 2.82 (*)    Hemoglobin 8.9 (*)    HCT 28.3 (*)    MCV 100.4 (*)    RDW 16.1 (*)    Abs Immature Granulocytes 0.08 (*)    All other components within normal limits  COMPREHENSIVE METABOLIC PANEL - Abnormal; Notable for the following components:   Glucose, Bld 127 (*)    BUN 25 (*)    Creatinine, Ser 1.39 (*)    Total Protein 6.1 (*)    Albumin 3.3 (*)    GFR calc non Af Amer 49 (*)    GFR calc Af Amer 57 (*)    All other components within normal limits  LIPASE, BLOOD  URINALYSIS, ROUTINE W REFLEX MICROSCOPIC  POC OCCULT BLOOD, ED    EKG EKG Interpretation  Date/Time:  Wednesday December 16 2019 13:48:37 EDT Ventricular Rate:  90 PR Interval:    QRS Duration: 144 QT Interval:  387 QTC Calculation: 474 R Axis:   32 Text Interpretation: Sinus rhythm Nonspecific intraventricular conduction delay Anterior infarct, old No significant change since last tracing Confirmed by Alvira MondaySchlossman, Parris Signer ([id number]) on 12/16/2019 4:48:54 PM   Radiology CT Head Wo Contrast  Result Date: 12/16/2019 CLINICAL DATA:  Head trauma, loss of consciousness. Additional history provided: Fall, loss of consciousness, abdominal pain EXAM: CT HEAD WITHOUT CONTRAST TECHNIQUE: Contiguous axial images were obtained from the base of the skull through  the vertex without intravenous contrast. COMPARISON:  Head CT 10/28/2013 FINDINGS: Brain: Stable, mild generalized parenchymal atrophy. Minimal ill-defined hypoattenuation within the cerebral white matter is nonspecific, but consistent with chronic small vessel ischemic disease. Findings have progressed as compared to the prior examination of 10/28/2013. There is no acute intracranial hemorrhage. No  demarcated cortical infarct. No extra-axial fluid collection. No evidence of intracranial mass. No midline shift. Vascular: No hyperdense vessel.  Atherosclerotic calcifications. Skull: Normal. Negative for fracture or focal lesion. Sinuses/Orbits: Visualized orbits show no acute finding. Partial opacification of a posterior left ethmoid air cell. No significant mastoid effusion. Other: Left frontotemporal scalp hematoma. IMPRESSION: No evidence of acute intracranial abnormality. Left frontotemporal scalp hematoma. Stable, mild generalized parenchymal atrophy. Progressive mild chronic small vessel ischemic disease. Left ethmoid sinus mucosal thickening. Electronically Signed   By: Jackey Loge DO   On: 12/16/2019 15:14   CT ABDOMEN PELVIS W CONTRAST  Result Date: 12/16/2019 CLINICAL DATA:  Fall, nonlocalized abdominal pain EXAM: CT ABDOMEN AND PELVIS WITH CONTRAST TECHNIQUE: Multidetector CT imaging of the abdomen and pelvis was performed using the standard protocol following bolus administration of intravenous contrast. CONTRAST:  OMNIPAQUE IOHEXOL 300 MG/ML  SOLN COMPARISON:  05/17/2014 FINDINGS: Lower chest: Mild bibasilar atelectasis. Probable coronary artery bypass grafting has been performed. Extensive calcification within the native coronary arteries. Global cardiac size within normal limits. Debris noted within the distal esophagus, nonspecific. Hepatobiliary: Gallbladder unremarkable. Liver unremarkable. No intra or extrahepatic biliary ductal dilation. Pancreas: Unremarkable Spleen: Unremarkable Adrenals/Urinary Tract: The adrenal glands are unremarkable. The kidneys are normal in size and position. There is massive distention of the bladder, with an estimated urine volume of 3 L. No definite intraluminal mass is identified. There is mild bilateral hydronephrosis, likely result of urinary retention. No intrarenal or ureteral calculi are identified. No enhancing renal masses are seen. Probable  post TURP changes within the prostate gland. Stomach/Bowel: Stomach, large bowel, and small bowel are unremarkable. The appendix is not visualized and is likely absent. No free intraperitoneal gas or fluid. Vascular/Lymphatic: No pathologic adenopathy within the abdomen and pelvis. There is extensive aortoiliac atherosclerotic calcification identified without evidence of aneurysm. Particularly prominent atherosclerotic calcification is seen involving the celiac and superior mesenteric artery origins as well as the renal artery origins bilaterally. The degree of stenosis is not well characterized on this non arteriographic study. Reproductive: As noted above, funneling of the central prostate gland is noted likely related to prior trans urethral resection. Other: The rectum is unremarkable. There is moderate asymmetric soft tissue infiltration involving the right lateral abdominal wall likely representing asymmetric subcutaneous edema or hemorrhage in this acutely traumatized patient subcutaneous 6 team a extends into the upper thighs bilaterally, asymmetrically more severe within the right thigh. Musculoskeletal: Several healed right rib fractures are identified. There has been interval development of severe wedge compression fractures of T11, T12, L1, and L3 with a vertebral plana deformity noted at L3 is resolved. There is no paraspinal hematoma or inflammatory stranding identified to suggest that these represent acute fractures. No associated listhesis. No acute bone abnormality identified. IMPRESSION: Massive distention of the bladder, likely related to bladder outlet obstruction, and probable resultant mild bilateral hydronephrosis. The estimated urine volume is greater than 3 L. Asymmetric soft tissue infiltration involving the right lateral abdominal wall and right thigh, possibly posttraumatic sickly T knee is edema or hemorrhage related to recent trauma. Interval development of multiple thoracolumbar  compression fractures all likely chronic in nature. Correlation for focal tenderness  may be helpful. If present, or in light of an abnormal neurologic examination, MRI examination may be more helpful for further assessment. Peripheral vascular disease with extensive atherosclerotic calcification at the origin of the renal and mesenteric vasculature. If there is clinical evidence of chronic mesenteric ischemia or significant renal artery stenosis, this could be better assessed with CT arteriography. Aortic Atherosclerosis (ICD10-I70.0). These results will be called to the ordering clinician or representative by the Radiologist Assistant, and communication documented in the PACS or [employer]. Electronically Signed   By: Helyn Numbers MD   On: 12/16/2019 15:21   DG Knee Complete 4 Views Right  Result Date: 12/16/2019 CLINICAL DATA:  Fall EXAM: RIGHT KNEE - COMPLETE 4+ VIEW COMPARISON:  None FINDINGS: There is no acute fracture or dislocation. A joint effusion is present. Tricompartmental changes of osteoarthritis. Vascular calcifications are noted. IMPRESSION: No acute fracture.  Joint effusion. Electronically Signed   By: Guadlupe Spanish M.D.   On: 12/16/2019 14:38    Procedures Procedures (including critical care time)  Medications Ordered in ED Medications  iohexol (OMNIPAQUE) 300 MG/ML solution 100 mL (100 mLs Intravenous Contrast Given 12/16/19 1445)    ED Course  I have reviewed the triage vital signs and the nursing notes.  Pertinent labs & imaging results that were available during my care of the patient were reviewed by me and considered in my medical decision making (see chart for details).    MDM Rules/Calculators/A&P                          74 year old male with history of ALS, coronary artery disease, hyperlipidemia, presents with concern for constipation.  Reported head trauma and fall, with confusion noted and CT head and CT abdomen pelvis found to evaluate for signs of  intracranial bleeding or retroperitoneal hematoma.  CT significant for significant bladder distention measuring approximately 3 L.  Placed Foley catheter.  Urinary retention may be somewhat related to constipation, consider other etiologies such as neurogenic bladder from ALS, bladder outlet obstruction.  Consider possibility of urinary symptoms also related to spine pathology, however no acute pain or focal tenderness on exam, have low suspicion for new compression fractures of the back, and in setting of patient with ALS and chronic lower extremity weakness do not feel that MRI lumbar spine or emergent neurosurgical evaluation is indicated.  Labs significant for hemoglobin 8.9 from previous of 12.3. Stool is normal color on exam, no sign of acute GI bleeding, nor bleeding related to trauma. Hemoccult negative. Macrocytic anemia, recommend follow up with PCP for recheck.    Mild elevation in Cr from baseline may be secondary to retention-recommend follow up with PCP for recheck as well as recheck hgb.  Discussed regimen for constipation.  Discharged with foley cathter to follow up with Urology as well as PCP.    Final Clinical Impression(s) / ED Diagnoses Final diagnoses:  Urinary retention  Constipation, unspecified constipation type  Anemia, unspecified type  Elevated serum creatinine    Rx / DC Orders ED Discharge Orders         Ordered    senna-docusate (SENOKOT-S) 8.6-50 MG tablet  Daily     Discontinue  Reprint     12/16/19 1704           Alvira Monday, MD 12/16/19 1708

## 2019-12-16 NOTE — Telephone Encounter (Signed)
FYI-Pt's wife called wanting to inform the provider that the pt is in the ED and they have done several scans on him and xray's.

## 2019-12-16 NOTE — ED Notes (Signed)
Patient verbalizes understanding of discharge instructions. Opportunity for questioning and answers were provided. Armband removed by staff, pt discharged from ED on stretcher to [facility] to go home.

## 2019-12-16 NOTE — Telephone Encounter (Signed)
It looks like patient went to the emergency room, he has bladder outlet obstruction, estimated greater than 3 L of urine in the bladder.  The patient had a catheter in place when seen in the office, but this was a condom catheter.  He will need an indwelling catheter.

## 2019-12-16 NOTE — ED Notes (Signed)
Waiting for [facility] 

## 2019-12-16 NOTE — ED Triage Notes (Signed)
Patient arrived via [facility], alert and oriented x4, skin warm and dry to touch, patient c/o weakness and constipation for 3 days. Patient stated he took Miralax and it did not help.  Patient has [place] disease and have a sitter that don't lift and he needs helps.  EMS VS- 110/68, 108, 97%, 16.  Pain level 5/10. Takes 5mg  Vicodin q6 hours.  Miralax PRN and take stool softeners everyday.

## 2019-12-16 NOTE — Discharge Instructions (Signed)
Recommend hydration, high fiber diet, senokot and recommended miralax-2 caps in 16-32oz bottle--may increase to 4 caps in 32oz bottle as needed for continued symptoms.

## 2019-12-17 ENCOUNTER — Telehealth: Payer: Self-pay | Admitting: Neurology

## 2019-12-17 DIAGNOSIS — G1221 Amyotrophic lateral sclerosis: Secondary | ICD-10-CM

## 2019-12-17 NOTE — Telephone Encounter (Signed)
I called the wife.  I will put the order in for the gel overlay for the bed.  The order will be faxed to [phone]

## 2019-12-17 NOTE — Telephone Encounter (Signed)
Hibbitts,Annie(wife on DPR) has called to report that the [employer] is only for rent at [employer], they don't have any for sale. Wife states an order is needed for Gel Overlay

## 2019-12-17 NOTE — Addendum Note (Signed)
Addended by: York Spaniel on: 12/17/2019 04:52 PM   Modules accepted: Orders

## 2019-12-18 DIAGNOSIS — E785 Hyperlipidemia, unspecified: Secondary | ICD-10-CM | POA: Diagnosis not present

## 2019-12-18 DIAGNOSIS — G1221 Amyotrophic lateral sclerosis: Secondary | ICD-10-CM | POA: Diagnosis not present

## 2019-12-18 DIAGNOSIS — M1990 Unspecified osteoarthritis, unspecified site: Secondary | ICD-10-CM | POA: Diagnosis not present

## 2019-12-18 DIAGNOSIS — H3530 Unspecified macular degeneration: Secondary | ICD-10-CM | POA: Diagnosis not present

## 2019-12-18 DIAGNOSIS — I2510 Atherosclerotic heart disease of native coronary artery without angina pectoris: Secondary | ICD-10-CM | POA: Diagnosis not present

## 2019-12-18 DIAGNOSIS — G4730 Sleep apnea, unspecified: Secondary | ICD-10-CM | POA: Diagnosis not present

## 2019-12-21 ENCOUNTER — Telehealth: Payer: Self-pay | Admitting: Neurology

## 2019-12-21 DIAGNOSIS — E785 Hyperlipidemia, unspecified: Secondary | ICD-10-CM | POA: Diagnosis not present

## 2019-12-21 DIAGNOSIS — H3530 Unspecified macular degeneration: Secondary | ICD-10-CM | POA: Diagnosis not present

## 2019-12-21 DIAGNOSIS — M1990 Unspecified osteoarthritis, unspecified site: Secondary | ICD-10-CM | POA: Diagnosis not present

## 2019-12-21 DIAGNOSIS — G1221 Amyotrophic lateral sclerosis: Secondary | ICD-10-CM

## 2019-12-21 DIAGNOSIS — I2510 Atherosclerotic heart disease of native coronary artery without angina pectoris: Secondary | ICD-10-CM | POA: Diagnosis not present

## 2019-12-21 DIAGNOSIS — G4730 Sleep apnea, unspecified: Secondary | ICD-10-CM | POA: Diagnosis not present

## 2019-12-21 NOTE — Telephone Encounter (Signed)
OT Mark @ [facility] is asking for orders requested for Drop Arm Bedside Commode, order needs to be sent to [facility] fax#[phone]

## 2019-12-22 NOTE — Telephone Encounter (Signed)
I have placed the order for bedside commode. Please fax the order.

## 2019-12-22 NOTE — Addendum Note (Signed)
Addended by: Glean Salvo on: 12/22/2019 08:38 AM   Modules accepted: Orders

## 2019-12-22 NOTE — Telephone Encounter (Signed)
Faxed to [facility] Confirmation received

## 2019-12-23 DIAGNOSIS — R5381 Other malaise: Secondary | ICD-10-CM | POA: Diagnosis not present

## 2019-12-23 DIAGNOSIS — G4730 Sleep apnea, unspecified: Secondary | ICD-10-CM | POA: Diagnosis not present

## 2019-12-23 DIAGNOSIS — I2510 Atherosclerotic heart disease of native coronary artery without angina pectoris: Secondary | ICD-10-CM | POA: Diagnosis not present

## 2019-12-23 DIAGNOSIS — M2550 Pain in unspecified joint: Secondary | ICD-10-CM | POA: Diagnosis not present

## 2019-12-23 DIAGNOSIS — Z7401 Bed confinement status: Secondary | ICD-10-CM | POA: Diagnosis not present

## 2019-12-23 DIAGNOSIS — N401 Enlarged prostate with lower urinary tract symptoms: Secondary | ICD-10-CM | POA: Diagnosis not present

## 2019-12-23 DIAGNOSIS — E785 Hyperlipidemia, unspecified: Secondary | ICD-10-CM | POA: Diagnosis not present

## 2019-12-23 DIAGNOSIS — R338 Other retention of urine: Secondary | ICD-10-CM | POA: Diagnosis not present

## 2019-12-23 DIAGNOSIS — H3530 Unspecified macular degeneration: Secondary | ICD-10-CM | POA: Diagnosis not present

## 2019-12-23 DIAGNOSIS — G1221 Amyotrophic lateral sclerosis: Secondary | ICD-10-CM | POA: Diagnosis not present

## 2019-12-23 DIAGNOSIS — M1990 Unspecified osteoarthritis, unspecified site: Secondary | ICD-10-CM | POA: Diagnosis not present

## 2019-12-24 ENCOUNTER — Telehealth: Payer: Self-pay | Admitting: Neurology

## 2019-12-24 DIAGNOSIS — M1990 Unspecified osteoarthritis, unspecified site: Secondary | ICD-10-CM | POA: Diagnosis not present

## 2019-12-24 DIAGNOSIS — G1221 Amyotrophic lateral sclerosis: Secondary | ICD-10-CM | POA: Diagnosis not present

## 2019-12-24 DIAGNOSIS — H3530 Unspecified macular degeneration: Secondary | ICD-10-CM | POA: Diagnosis not present

## 2019-12-24 DIAGNOSIS — I2510 Atherosclerotic heart disease of native coronary artery without angina pectoris: Secondary | ICD-10-CM | POA: Diagnosis not present

## 2019-12-24 DIAGNOSIS — E785 Hyperlipidemia, unspecified: Secondary | ICD-10-CM | POA: Diagnosis not present

## 2019-12-24 DIAGNOSIS — G4730 Sleep apnea, unspecified: Secondary | ICD-10-CM | POA: Diagnosis not present

## 2019-12-24 NOTE — Telephone Encounter (Signed)
Spoke to Jason Tate He states his wife is the one with hospice questions, he has none at this time. I informed him that Dr Anne Hahn is out of the office and will return Monday  He states they are happy to wait till next Monday .

## 2019-12-24 NOTE — Telephone Encounter (Signed)
Pt's wife called wanting to know if the provider can call her back to discuss what was mentioned at the last appt about Hospice. Please advise.

## 2019-12-25 DIAGNOSIS — H3530 Unspecified macular degeneration: Secondary | ICD-10-CM | POA: Diagnosis not present

## 2019-12-25 DIAGNOSIS — G4730 Sleep apnea, unspecified: Secondary | ICD-10-CM | POA: Diagnosis not present

## 2019-12-25 DIAGNOSIS — G1221 Amyotrophic lateral sclerosis: Secondary | ICD-10-CM | POA: Diagnosis not present

## 2019-12-25 DIAGNOSIS — M1990 Unspecified osteoarthritis, unspecified site: Secondary | ICD-10-CM | POA: Diagnosis not present

## 2019-12-25 DIAGNOSIS — E785 Hyperlipidemia, unspecified: Secondary | ICD-10-CM | POA: Diagnosis not present

## 2019-12-25 DIAGNOSIS — I2510 Atherosclerotic heart disease of native coronary artery without angina pectoris: Secondary | ICD-10-CM | POA: Diagnosis not present

## 2019-12-25 NOTE — Telephone Encounter (Signed)
I called the wife.  She is considering hospice, she basically needs day-to-day assistance, I am not sure that hospice would provide all of that, but it could provide respite care and equipment for the home.  If they decide they wish to have a referral to hospice, I do think it that he is a candidate.  His clinical situation is that of a rapid progression.  If they wish to have hospice, she will call me back.

## 2019-12-28 DIAGNOSIS — G1221 Amyotrophic lateral sclerosis: Secondary | ICD-10-CM | POA: Diagnosis not present

## 2019-12-28 DIAGNOSIS — M1990 Unspecified osteoarthritis, unspecified site: Secondary | ICD-10-CM | POA: Diagnosis not present

## 2019-12-28 DIAGNOSIS — Z9181 History of falling: Secondary | ICD-10-CM | POA: Diagnosis not present

## 2019-12-28 DIAGNOSIS — I2510 Atherosclerotic heart disease of native coronary artery without angina pectoris: Secondary | ICD-10-CM | POA: Diagnosis not present

## 2019-12-28 DIAGNOSIS — G4730 Sleep apnea, unspecified: Secondary | ICD-10-CM | POA: Diagnosis not present

## 2019-12-28 DIAGNOSIS — H3530 Unspecified macular degeneration: Secondary | ICD-10-CM | POA: Diagnosis not present

## 2019-12-28 DIAGNOSIS — E785 Hyperlipidemia, unspecified: Secondary | ICD-10-CM | POA: Diagnosis not present

## 2019-12-28 DIAGNOSIS — I441 Atrioventricular block, second degree: Secondary | ICD-10-CM | POA: Diagnosis not present

## 2019-12-30 DIAGNOSIS — I2510 Atherosclerotic heart disease of native coronary artery without angina pectoris: Secondary | ICD-10-CM | POA: Diagnosis not present

## 2019-12-30 DIAGNOSIS — E785 Hyperlipidemia, unspecified: Secondary | ICD-10-CM | POA: Diagnosis not present

## 2019-12-30 DIAGNOSIS — H3530 Unspecified macular degeneration: Secondary | ICD-10-CM | POA: Diagnosis not present

## 2019-12-30 DIAGNOSIS — G4730 Sleep apnea, unspecified: Secondary | ICD-10-CM | POA: Diagnosis not present

## 2019-12-30 DIAGNOSIS — G1221 Amyotrophic lateral sclerosis: Secondary | ICD-10-CM | POA: Diagnosis not present

## 2019-12-30 DIAGNOSIS — M1990 Unspecified osteoarthritis, unspecified site: Secondary | ICD-10-CM | POA: Diagnosis not present

## 2020-01-04 DIAGNOSIS — H3530 Unspecified macular degeneration: Secondary | ICD-10-CM | POA: Diagnosis not present

## 2020-01-04 DIAGNOSIS — G4730 Sleep apnea, unspecified: Secondary | ICD-10-CM | POA: Diagnosis not present

## 2020-01-04 DIAGNOSIS — G1221 Amyotrophic lateral sclerosis: Secondary | ICD-10-CM | POA: Diagnosis not present

## 2020-01-04 DIAGNOSIS — I2510 Atherosclerotic heart disease of native coronary artery without angina pectoris: Secondary | ICD-10-CM | POA: Diagnosis not present

## 2020-01-04 DIAGNOSIS — M1990 Unspecified osteoarthritis, unspecified site: Secondary | ICD-10-CM | POA: Diagnosis not present

## 2020-01-04 DIAGNOSIS — E785 Hyperlipidemia, unspecified: Secondary | ICD-10-CM | POA: Diagnosis not present

## 2020-01-05 DIAGNOSIS — I2510 Atherosclerotic heart disease of native coronary artery without angina pectoris: Secondary | ICD-10-CM | POA: Diagnosis not present

## 2020-01-05 DIAGNOSIS — G1221 Amyotrophic lateral sclerosis: Secondary | ICD-10-CM | POA: Diagnosis not present

## 2020-01-05 DIAGNOSIS — H3530 Unspecified macular degeneration: Secondary | ICD-10-CM | POA: Diagnosis not present

## 2020-01-05 DIAGNOSIS — G4730 Sleep apnea, unspecified: Secondary | ICD-10-CM | POA: Diagnosis not present

## 2020-01-05 DIAGNOSIS — E785 Hyperlipidemia, unspecified: Secondary | ICD-10-CM | POA: Diagnosis not present

## 2020-01-05 DIAGNOSIS — M1990 Unspecified osteoarthritis, unspecified site: Secondary | ICD-10-CM | POA: Diagnosis not present

## 2020-01-06 DIAGNOSIS — G4730 Sleep apnea, unspecified: Secondary | ICD-10-CM | POA: Diagnosis not present

## 2020-01-06 DIAGNOSIS — E785 Hyperlipidemia, unspecified: Secondary | ICD-10-CM | POA: Diagnosis not present

## 2020-01-06 DIAGNOSIS — H3530 Unspecified macular degeneration: Secondary | ICD-10-CM | POA: Diagnosis not present

## 2020-01-06 DIAGNOSIS — M1990 Unspecified osteoarthritis, unspecified site: Secondary | ICD-10-CM | POA: Diagnosis not present

## 2020-01-06 DIAGNOSIS — I2510 Atherosclerotic heart disease of native coronary artery without angina pectoris: Secondary | ICD-10-CM | POA: Diagnosis not present

## 2020-01-06 DIAGNOSIS — G1221 Amyotrophic lateral sclerosis: Secondary | ICD-10-CM | POA: Diagnosis not present

## 2020-01-07 ENCOUNTER — Other Ambulatory Visit: Payer: Self-pay | Admitting: Neurology

## 2020-01-07 MED ORDER — HYDROCODONE-ACETAMINOPHEN 5-325 MG PO TABS: 1.0000 | ORAL_TABLET | Freq: Four times a day (QID) | ORAL | 0 refills | Status: DC | PRN

## 2020-01-07 NOTE — Telephone Encounter (Signed)
Pt called again for med refill  Informed him refill message has been sent to [facility] for review

## 2020-01-07 NOTE — Telephone Encounter (Signed)
Pt is needing a refill on his HYDROcodone-acetaminophen (NORCO/VICODIN) 5-325 MG tablet [facility] [address]

## 2020-01-07 NOTE — Telephone Encounter (Signed)
I have routed this request to Dr Vickey Huger (in Dr Anne Hahn absence) for review. The pt is due for the medication and [place] registry was verified.

## 2020-01-11 ENCOUNTER — Telehealth: Payer: Self-pay | Admitting: Neurology

## 2020-01-11 DIAGNOSIS — E785 Hyperlipidemia, unspecified: Secondary | ICD-10-CM | POA: Diagnosis not present

## 2020-01-11 DIAGNOSIS — H3530 Unspecified macular degeneration: Secondary | ICD-10-CM | POA: Diagnosis not present

## 2020-01-11 DIAGNOSIS — I2510 Atherosclerotic heart disease of native coronary artery without angina pectoris: Secondary | ICD-10-CM | POA: Diagnosis not present

## 2020-01-11 DIAGNOSIS — M1990 Unspecified osteoarthritis, unspecified site: Secondary | ICD-10-CM | POA: Diagnosis not present

## 2020-01-11 DIAGNOSIS — G1221 Amyotrophic lateral sclerosis: Secondary | ICD-10-CM | POA: Diagnosis not present

## 2020-01-11 DIAGNOSIS — G4730 Sleep apnea, unspecified: Secondary | ICD-10-CM | POA: Diagnosis not present

## 2020-01-11 NOTE — Telephone Encounter (Signed)
Pt request refill  ALPRAZolam (XANAX) 0.5 MG tablet, HYDROcodone-acetaminophen (NORCO/VICODIN) 5-325 MG tablet at [id number]

## 2020-01-11 NOTE — Telephone Encounter (Signed)
Xanax has refills on file at the pharmacy.  Hydrocodone was sent to [facility] on 01/07/20 in error. [facility] have [facility] [address] I called [facility] [phone]) and spoke to Amy who confirmed they received the prescription. It has already been processed and shipped out to be delivered on 01/12/20.   I called the patient's wife back with this update. She stated the patient would be okay until tomorrow. She understands that future controlled substance refills will be sent to the local [facility].

## 2020-01-12 ENCOUNTER — Emergency Department (HOSPITAL_COMMUNITY): Payer: Medicare Other

## 2020-01-12 ENCOUNTER — Encounter (HOSPITAL_COMMUNITY): Payer: Self-pay

## 2020-01-12 ENCOUNTER — Other Ambulatory Visit: Payer: Self-pay

## 2020-01-12 ENCOUNTER — Inpatient Hospital Stay (HOSPITAL_COMMUNITY)
Admission: EM | Admit: 2020-01-12 | Discharge: 2020-01-15 | DRG: 698 | Disposition: A | Discharge status: Home Health | Payer: Medicare Other | Attending: Internal Medicine | Admitting: Internal Medicine

## 2020-01-12 ENCOUNTER — Telehealth: Payer: Self-pay | Admitting: Neurology

## 2020-01-12 DIAGNOSIS — Z79899 Other long term (current) drug therapy: Secondary | ICD-10-CM

## 2020-01-12 DIAGNOSIS — L89156 Pressure-induced deep tissue damage of sacral region: Secondary | ICD-10-CM | POA: Diagnosis present

## 2020-01-12 DIAGNOSIS — Z87891 Personal history of nicotine dependence: Secondary | ICD-10-CM

## 2020-01-12 DIAGNOSIS — I2510 Atherosclerotic heart disease of native coronary artery without angina pectoris: Secondary | ICD-10-CM | POA: Diagnosis present

## 2020-01-12 DIAGNOSIS — H3530 Unspecified macular degeneration: Secondary | ICD-10-CM | POA: Diagnosis present

## 2020-01-12 DIAGNOSIS — Z8249 Family history of ischemic heart disease and other diseases of the circulatory system: Secondary | ICD-10-CM | POA: Diagnosis not present

## 2020-01-12 DIAGNOSIS — R627 Adult failure to thrive: Secondary | ICD-10-CM | POA: Diagnosis present

## 2020-01-12 DIAGNOSIS — T83511A Infection and inflammatory reaction due to indwelling urethral catheter, initial encounter: Secondary | ICD-10-CM | POA: Diagnosis not present

## 2020-01-12 DIAGNOSIS — Z7401 Bed confinement status: Secondary | ICD-10-CM | POA: Diagnosis not present

## 2020-01-12 DIAGNOSIS — N136 Pyonephrosis: Secondary | ICD-10-CM | POA: Diagnosis present

## 2020-01-12 DIAGNOSIS — R410 Disorientation, unspecified: Secondary | ICD-10-CM | POA: Diagnosis not present

## 2020-01-12 DIAGNOSIS — A419 Sepsis, unspecified organism: Secondary | ICD-10-CM | POA: Diagnosis not present

## 2020-01-12 DIAGNOSIS — G1221 Amyotrophic lateral sclerosis: Secondary | ICD-10-CM | POA: Diagnosis present

## 2020-01-12 DIAGNOSIS — G9341 Metabolic encephalopathy: Secondary | ICD-10-CM | POA: Diagnosis present

## 2020-01-12 DIAGNOSIS — E7800 Pure hypercholesterolemia, unspecified: Secondary | ICD-10-CM | POA: Diagnosis present

## 2020-01-12 DIAGNOSIS — Z880 Allergy status to penicillin: Secondary | ICD-10-CM | POA: Diagnosis not present

## 2020-01-12 DIAGNOSIS — M2550 Pain in unspecified joint: Secondary | ICD-10-CM | POA: Diagnosis not present

## 2020-01-12 DIAGNOSIS — N390 Urinary tract infection, site not specified: Secondary | ICD-10-CM | POA: Diagnosis present

## 2020-01-12 DIAGNOSIS — R404 Transient alteration of awareness: Secondary | ICD-10-CM | POA: Diagnosis not present

## 2020-01-12 DIAGNOSIS — I441 Atrioventricular block, second degree: Secondary | ICD-10-CM | POA: Diagnosis present

## 2020-01-12 DIAGNOSIS — G4733 Obstructive sleep apnea (adult) (pediatric): Secondary | ICD-10-CM | POA: Diagnosis present

## 2020-01-12 DIAGNOSIS — Z823 Family history of stroke: Secondary | ICD-10-CM | POA: Diagnosis not present

## 2020-01-12 DIAGNOSIS — Z951 Presence of aortocoronary bypass graft: Secondary | ICD-10-CM | POA: Diagnosis not present

## 2020-01-12 DIAGNOSIS — A4152 Sepsis due to Pseudomonas: Secondary | ICD-10-CM | POA: Diagnosis present

## 2020-01-12 DIAGNOSIS — Z20822 Contact with and (suspected) exposure to covid-19: Secondary | ICD-10-CM | POA: Diagnosis present

## 2020-01-12 DIAGNOSIS — I25810 Atherosclerosis of coronary artery bypass graft(s) without angina pectoris: Secondary | ICD-10-CM

## 2020-01-12 DIAGNOSIS — Z66 Do not resuscitate: Secondary | ICD-10-CM | POA: Diagnosis present

## 2020-01-12 DIAGNOSIS — R000 Tachycardia, unspecified: Secondary | ICD-10-CM | POA: Diagnosis not present

## 2020-01-12 DIAGNOSIS — I700 Atherosclerosis of aorta: Secondary | ICD-10-CM | POA: Diagnosis not present

## 2020-01-12 DIAGNOSIS — R338 Other retention of urine: Secondary | ICD-10-CM | POA: Diagnosis not present

## 2020-01-12 DIAGNOSIS — R531 Weakness: Secondary | ICD-10-CM | POA: Diagnosis not present

## 2020-01-12 DIAGNOSIS — R5381 Other malaise: Secondary | ICD-10-CM | POA: Diagnosis not present

## 2020-01-12 LAB — URINALYSIS, ROUTINE W REFLEX MICROSCOPIC
Bilirubin Urine: NEGATIVE
Glucose, UA: NEGATIVE mg/dL
Ketones, ur: 5 mg/dL — AB
Nitrite: NEGATIVE
Protein, ur: NEGATIVE mg/dL
Specific Gravity, Urine: 1.015 (ref 1.005–1.030)
WBC, UA: >50 WBC/hpf — ABNORMAL HIGH (ref 0–5)
pH: 5 (ref 5.0–8.0)

## 2020-01-12 LAB — CBC WITH DIFFERENTIAL/PLATELET
Abs Immature Granulocytes: 0.23 10*3/uL — ABNORMAL HIGH (ref 0.00–0.07)
Basophils Absolute: 0.1 10*3/uL (ref 0.0–0.1)
Basophils Relative: 0 %
Eosinophils Absolute: 0 10*3/uL (ref 0.0–0.5)
Eosinophils Relative: 0 %
HCT: 29.3 % — ABNORMAL LOW (ref 39.0–52.0)
Hemoglobin: 9.5 g/dL — ABNORMAL LOW (ref 13.0–17.0)
Immature Granulocytes: 1 %
Lymphocytes Relative: 3 %
Lymphs Abs: 0.5 10*3/uL — ABNORMAL LOW (ref 0.7–4.0)
MCH: 31.1 pg (ref 26.0–34.0)
MCHC: 32.4 g/dL (ref 30.0–36.0)
MCV: 96.1 fL (ref 80.0–100.0)
Monocytes Absolute: 1.3 10*3/uL — ABNORMAL HIGH (ref 0.1–1.0)
Monocytes Relative: 7 %
Neutro Abs: 16.8 10*3/uL — ABNORMAL HIGH (ref 1.7–7.7)
Neutrophils Relative %: 89 %
Platelets: 298 10*3/uL (ref 150–400)
RBC: 3.05 MIL/uL — ABNORMAL LOW (ref 4.22–5.81)
RDW: 15.4 % (ref 11.5–15.5)
WBC: 18.9 10*3/uL — ABNORMAL HIGH (ref 4.0–10.5)
nRBC: 0 % (ref 0.0–0.2)

## 2020-01-12 LAB — COMPREHENSIVE METABOLIC PANEL
ALT: 19 U/L (ref 0–44)
AST: 15 U/L (ref 15–41)
Albumin: 3.3 g/dL — ABNORMAL LOW (ref 3.5–5.0)
Alkaline Phosphatase: 92 U/L (ref 38–126)
Anion gap: 12 (ref 5–15)
BUN: 13 mg/dL (ref 8–23)
CO2: 22 mmol/L (ref 22–32)
Calcium: 9 mg/dL (ref 8.9–10.3)
Chloride: 95 mmol/L — ABNORMAL LOW (ref 98–111)
Creatinine, Ser: 0.6 mg/dL — ABNORMAL LOW (ref 0.61–1.24)
GFR calc Af Amer: >60 mL/min (ref >60)
GFR calc non Af Amer: >60 mL/min (ref >60)
Glucose, Bld: 102 mg/dL — ABNORMAL HIGH (ref 70–99)
Potassium: 4 mmol/L (ref 3.5–5.1)
Sodium: 129 mmol/L — ABNORMAL LOW (ref 135–145)
Total Bilirubin: 1.2 mg/dL (ref 0.3–1.2)
Total Protein: 7.1 g/dL (ref 6.5–8.1)

## 2020-01-12 LAB — LIPASE, BLOOD: Lipase: 21 U/L (ref 11–51)

## 2020-01-12 LAB — SARS CORONAVIRUS 2 BY RT PCR (HOSPITAL ORDER, PERFORMED IN ~~LOC~~ HOSPITAL LAB): SARS Coronavirus 2: NEGATIVE

## 2020-01-12 LAB — PROTIME-INR
INR: 1.1 (ref 0.8–1.2)
Prothrombin Time: 13.9 seconds (ref 11.4–15.2)

## 2020-01-12 LAB — APTT: aPTT: 35 seconds (ref 24–36)

## 2020-01-12 LAB — LACTIC ACID, PLASMA: Lactic Acid, Venous: 0.8 mmol/L (ref 0.5–1.9)

## 2020-01-12 MED ORDER — COPPER 5 MG PO TABS: 5.0000 mg | ORAL_TABLET | Freq: Every day | ORAL | Status: DC

## 2020-01-12 MED ORDER — SIMVASTATIN 20 MG PO TABS
20.0000 mg | ORAL_TABLET | Freq: Every day | ORAL | Status: DC
  Administered 2020-01-12 – 2020-01-14 (×3): 20 mg via ORAL
  Filled 2020-01-12 (×3): qty 1

## 2020-01-12 MED ORDER — NABUMETONE 500 MG PO TABS
750.0000 mg | ORAL_TABLET | Freq: Two times a day (BID) | ORAL | Status: DC
  Administered 2020-01-12 – 2020-01-15 (×6): 750 mg via ORAL
  Filled 2020-01-12 (×8): qty 2

## 2020-01-12 MED ORDER — ENOXAPARIN SODIUM 40 MG/0.4ML ~~LOC~~ SOLN
40.0000 mg | SUBCUTANEOUS | Status: DC
  Administered 2020-01-12 – 2020-01-14 (×3): 40 mg via SUBCUTANEOUS
  Filled 2020-01-12 (×3): qty 0.4

## 2020-01-12 MED ORDER — SODIUM CHLORIDE 0.9 % IV SOLN
2.0000 g | Freq: Three times a day (TID) | INTRAVENOUS | Status: DC
  Administered 2020-01-12 – 2020-01-14 (×7): 2 g via INTRAVENOUS
  Filled 2020-01-12 (×10): qty 2

## 2020-01-12 MED ORDER — PANTOPRAZOLE SODIUM 40 MG PO TBEC
40.0000 mg | DELAYED_RELEASE_TABLET | Freq: Every day | ORAL | Status: DC
  Administered 2020-01-13 – 2020-01-15 (×3): 40 mg via ORAL
  Filled 2020-01-12 (×3): qty 1

## 2020-01-12 MED ORDER — ACETAMINOPHEN 325 MG PO TABS: 650.0000 mg | ORAL_TABLET | Freq: Four times a day (QID) | ORAL | Status: DC | PRN

## 2020-01-12 MED ORDER — SENNOSIDES-DOCUSATE SODIUM 8.6-50 MG PO TABS
1.0000 | ORAL_TABLET | Freq: Every day | ORAL | Status: DC
  Administered 2020-01-13 – 2020-01-15 (×3): 1 via ORAL
  Filled 2020-01-12 (×3): qty 1

## 2020-01-12 MED ORDER — SODIUM CHLORIDE 0.9 % IV SOLN
2.0000 g | Freq: Once | INTRAVENOUS | Status: AC
  Administered 2020-01-12: 2 g via INTRAVENOUS
  Filled 2020-01-12: qty 20

## 2020-01-12 MED ORDER — ONDANSETRON HCL 4 MG/2ML IJ SOLN: 4.0000 mg | Freq: Four times a day (QID) | INTRAMUSCULAR | Status: DC | PRN

## 2020-01-12 MED ORDER — HYDROCODONE-ACETAMINOPHEN 5-325 MG PO TABS
1.0000 | ORAL_TABLET | Freq: Four times a day (QID) | ORAL | Status: DC | PRN
  Administered 2020-01-14 – 2020-01-15 (×2): 1 via ORAL
  Filled 2020-01-12 (×2): qty 1

## 2020-01-12 MED ORDER — LACTATED RINGERS IV BOLUS
1000.0000 mL | Freq: Once | INTRAVENOUS | Status: AC
  Administered 2020-01-12: 1000 mL via INTRAVENOUS

## 2020-01-12 MED ORDER — LACTATED RINGERS IV SOLN: INTRAVENOUS | Status: DC

## 2020-01-12 MED ORDER — ALLOPURINOL 100 MG PO TABS
100.0000 mg | ORAL_TABLET | Freq: Every day | ORAL | Status: DC
  Administered 2020-01-13 – 2020-01-15 (×3): 100 mg via ORAL
  Filled 2020-01-12 (×4): qty 1

## 2020-01-12 MED ORDER — ACETAMINOPHEN 325 MG PO TABS
650.0000 mg | ORAL_TABLET | Freq: Once | ORAL | Status: AC
  Administered 2020-01-12: 650 mg via ORAL
  Filled 2020-01-12: qty 2

## 2020-01-12 MED ORDER — TAMSULOSIN HCL 0.4 MG PO CAPS
0.4000 mg | ORAL_CAPSULE | Freq: Every day | ORAL | Status: DC
  Administered 2020-01-13 – 2020-01-15 (×3): 0.4 mg via ORAL
  Filled 2020-01-12 (×3): qty 1

## 2020-01-12 MED ORDER — RILUZOLE 50 MG PO TABS: 50.0000 mg | ORAL_TABLET | Freq: Two times a day (BID) | ORAL | Status: DC

## 2020-01-12 MED ORDER — ONDANSETRON HCL 4 MG/2ML IJ SOLN
4.0000 mg | Freq: Once | INTRAMUSCULAR | Status: DC
  Filled 2020-01-12: qty 2

## 2020-01-12 MED ORDER — CLONAZEPAM 1 MG PO TABS
1.0000 mg | ORAL_TABLET | Freq: Every evening | ORAL | Status: DC | PRN
  Administered 2020-01-13 – 2020-01-14 (×2): 1 mg via ORAL
  Filled 2020-01-12: qty 2
  Filled 2020-01-12: qty 1

## 2020-01-12 MED ORDER — ALPRAZOLAM 1 MG PO TABS
1.0000 mg | ORAL_TABLET | Freq: Three times a day (TID) | ORAL | 1 refills | Status: AC | PRN
Start: 2020-01-12 — End: ?

## 2020-01-12 MED ORDER — CITALOPRAM HYDROBROMIDE 20 MG PO TABS
20.0000 mg | ORAL_TABLET | Freq: Every day | ORAL | Status: DC
  Administered 2020-01-13 – 2020-01-15 (×3): 20 mg via ORAL
  Filled 2020-01-12: qty 1
  Filled 2020-01-12 (×2): qty 2
  Filled 2020-01-12 (×3): qty 1

## 2020-01-12 MED ORDER — FLUTICASONE PROPIONATE 50 MCG/ACT NA SUSP
1.0000 | Freq: Every day | NASAL | Status: DC | PRN
  Filled 2020-01-12: qty 16

## 2020-01-12 MED ORDER — ASPIRIN 81 MG PO CHEW
81.0000 mg | CHEWABLE_TABLET | Freq: Every day | ORAL | Status: DC
  Administered 2020-01-13 – 2020-01-15 (×3): 81 mg via ORAL
  Filled 2020-01-12 (×3): qty 1

## 2020-01-12 MED ORDER — ALPRAZOLAM 1 MG PO TABS
1.0000 mg | ORAL_TABLET | Freq: Three times a day (TID) | ORAL | Status: DC | PRN
  Administered 2020-01-12 – 2020-01-15 (×4): 1 mg via ORAL
  Filled 2020-01-12: qty 2
  Filled 2020-01-12 (×2): qty 1
  Filled 2020-01-12: qty 2

## 2020-01-12 MED ORDER — IBUPROFEN 200 MG PO TABS: 400.0000 mg | ORAL_TABLET | Freq: Every evening | ORAL | Status: DC | PRN

## 2020-01-12 MED ORDER — ACETAMINOPHEN 650 MG RE SUPP: 650.0000 mg | Freq: Four times a day (QID) | RECTAL | Status: DC | PRN

## 2020-01-12 MED ORDER — ONDANSETRON HCL 4 MG PO TABS: 4.0000 mg | ORAL_TABLET | Freq: Four times a day (QID) | ORAL | Status: DC | PRN

## 2020-01-12 MED ORDER — BUPROPION HCL ER (XL) 300 MG PO TB24
300.0000 mg | ORAL_TABLET | Freq: Every day | ORAL | Status: DC
  Administered 2020-01-13 – 2020-01-15 (×3): 300 mg via ORAL
  Filled 2020-01-12: qty 1
  Filled 2020-01-12 (×2): qty 2

## 2020-01-12 NOTE — Telephone Encounter (Signed)
Wife has called to report that pt is running a fever and she needs to know what to do.  Please call

## 2020-01-12 NOTE — ED Notes (Signed)
Patient is confused and disoriented, indwelling cath is patent and draining some cloudy, yellow, urine at this time.  Will continue to monitor.

## 2020-01-12 NOTE — Telephone Encounter (Signed)
He had a lowI called the wife back.  They are planning to take the patient to the emergency room, his fever is shooting up rapidly, it is almost 102 at this time.  He had a low-grade fever this morning, but I was not aware of this.  The patient has indwelling catheter, likely has a urinary tract infection, he is somewhat restless, he may be becoming septic, going to the emergency room is probably the right thing to do.  He is not complaining of any cough or shortness of breath.

## 2020-01-12 NOTE — ED Triage Notes (Signed)
EMS reports from home, wife called PCP for increased lethargy and fever, not eating or drinking normally x 3 days similar to prior UTI symptoms, cloudy urine in cath bag. PCP sent to ED for EVAL.of possible UTI. Pt bed bound, Hx of ALS.  BP 146/76 HR 120 RR 20 Sp02 100 RA Temp 101.1

## 2020-01-12 NOTE — ED Provider Notes (Signed)
[facility]-EMERGENCY DEPT Provider Note   CSN: [id number] Arrival date & time: 01/12/20  1820     History Chief Complaint  Patient presents with  . Fever  . Weakness  . Failure To Thrive    Jason Tate is a 74 y.o. male.  Patient with history of ALS, with a chronic indwelling Foley catheter who presents to the ED with fever, weakness.  Foley catheter has not been exchanged in a long time.  Suspect urinary tract infection.  Denies any cough or abdominal pain, chest pain or shortness of breath.  The history is provided by the patient.  Fever Temp source:  Subjective Severity:  Mild Onset quality:  Gradual Timing:  Constant Progression:  Unchanged Chronicity:  New Relieved by:  Nothing Worsened by:  Nothing Associated symptoms: chills   Associated symptoms: no chest pain, no confusion, no cough, no diarrhea, no dysuria, no ear pain, no rash, no sore throat and no vomiting   Risk factors: no sick contacts        Past Medical History:  Diagnosis Date  . Abnormal stress echo Oct 2012  . 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 Arthritis   . CAD (coronary artery disease) October 2012   s/p CABG x 2 per Dr. Cornelius Moras  . Gait abnormality 12/25/2018  . Hypercholesterolemia   . Low testosterone   . Macular degeneration   . 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 Obesity   . PVC (premature ventricular contraction)   . Sleep apnea, obstructive    on BiPAP    Patient Active Problem List   Diagnosis Date Noted  . 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 Gait abnormality 12/25/2018  . Macular degeneration 09/22/2018  . Depression 04/18/2015  . OSA (obstructive sleep apnea) 04/18/2015  . Leg edema, left 11/02/2013  . 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 CAD (coronary artery disease) 04/09/2011  . Gout 04/09/2011  . Fatigue 03/05/2011  .  Abnormal ECG 03/05/2011  . Hypercholesterolemia   . PVC (premature ventricular contraction)     Past Surgical History:  Procedure Laterality Date  . COLONOSCOPY    . CORONARY ARTERY BYPASS GRAFT  Oct 2012   LIMA to LAD and left radial to OM  . HEMORRHOIDECTOMY WITH HEMORRHOID BANDING    . INJECTION KNEE         Family History  Problem Relation Age of Onset  . Heart disease Mother   . Stroke Father   . Heart disease Father   . Dementia Father     Social History   Tobacco Use  . Smoking status: Former Smoker    Packs/day: 1.00    Years: 20.00    Pack years: 20.00    Types: Cigarettes    Quit date: 03/28/1980    Years since quitting: 39.8  . Smokeless tobacco: Never Used  Vaping Use  . Vaping Use: Never used  Substance Use Topics  . Alcohol use: Yes    Alcohol/week: 28.0 standard drinks    Types: 28 Cans of beer per week    Comment: 4 beers a night  . Drug use: No    Home Medications Prior to Admission medications   Medication Sig Start Date End Date Taking? Authorizing Provider  allopurinol (ZYLOPRIM) 100 MG tablet Take 1 tablet (100 mg total) by mouth daily. 07/14/19   [name]., MD  ALPRAZolam (XANAX) 1 MG tablet Take 1 tablet (1 mg total) by mouth 3 (three) times daily as needed for anxiety. 01/12/20   York SpanielWillis, Briana K, MD  aspirin 81 MG tablet Take 81 mg by mouth daily.    [provider]  B Complex Vitamins (VITAMIN B COMPLEX PO) Take 1 tablet by mouth daily.     [provider]  buPROPion (WELLBUTRIN XL) 300 MG 24 hr tablet Take 1 tablet (300 mg total) by mouth daily. 07/14/19   Maple HudsonGilbert, Richard L Jr., MD  Cetirizine HCl (ZYRTEC ALLERGY PO) Take 1 tablet by mouth daily as needed (allergies).     [provider]  Cholecalciferol (VITAMIN D PO) Take 4,000 mg by mouth daily.      [provider]  citalopram (CELEXA) 20 MG tablet Take 1 tablet (20 mg total) by mouth daily. 07/14/19   Maple HudsonGilbert, Richard L Jr., MD    clonazePAM (KLONOPIN) 1 MG tablet Take 1 tablet (1 mg total) by mouth at bedtime as needed. for sleep 07/14/19   Maple HudsonGilbert, Richard L Jr., MD  Copper 5 MG TABS Take 5 mg by mouth daily.    [provider]  fluticasone (FLONASE) 50 MCG/ACT nasal spray Place 1 spray into both nostrils daily as needed for allergies. 09/11/17   Maple HudsonGilbert, Richard L Jr., MD  HYDROcodone-acetaminophen (NORCO/VICODIN) 5-325 MG tablet Take 1 tablet by mouth every 6 (six) hours as needed for moderate pain. 01/07/20   Dohmeier, Porfirio Mylararmen, MD  ibuprofen (ADVIL) 200 MG tablet Take 400 mg by mouth at bedtime as needed.     [provider]  nabumetone (RELAFEN) 750 MG tablet Take 1 tablet (750 mg total) by mouth daily. Patient taking differently: Take 750 mg by mouth 2 (two) times daily.  09/14/19   Maple HudsonGilbert, Richard L Jr., MD  NON FORMULARY TUDCA 1 gm 2 per day    [provider]  Omega-3 Fatty Acids (FISH OIL PO) Take 1 tablet by mouth daily.     [provider]  omeprazole (PRILOSEC) 20 MG capsule Take 1 capsule (20 mg total) by mouth daily. 11/18/19   Maple HudsonGilbert, Richard L Jr., MD  riluzole (RILUTEK) 50 MG tablet Take 50 mg by mouth every 12 (twelve) hours.    [provider]  senna-docusate (SENOKOT-S) 8.6-50 MG tablet Take 1 tablet by mouth daily. 12/16/19   Alvira MondaySchlossman, Erin, MD  simvastatin (ZOCOR) 20 MG tablet Take 1 tablet (20 mg total) by mouth at bedtime. 07/14/19   Maple HudsonGilbert, Richard L Jr., MD  tamsulosin (FLOMAX) 0.4 MG CAPS capsule Take 0.4 mg by mouth daily. 12/23/19   [provider]    Allergies    Penicillins  Review of Systems   Review of Systems  Constitutional: Positive for chills and fever.  HENT: Negative for ear pain and sore throat.   Eyes: Negative for pain and visual disturbance.  Respiratory: Negative for cough and shortness of breath.   Cardiovascular: Negative for chest pain and palpitations.  Gastrointestinal: Negative for abdominal pain, diarrhea and  vomiting.  Genitourinary: Negative for dysuria and hematuria.  Musculoskeletal: Negative for arthralgias and back pain.  Skin: Negative for color change and rash.  Neurological: Positive for weakness. Negative for seizures and syncope.  Psychiatric/Behavioral: Negative for confusion.  All other systems reviewed and are negative.   Physical Exam Updated Vital Signs  ED Triage Vitals  Enc Vitals Group     BP 01/12/20 1828 (!) 148/91     Pulse Rate 01/12/20  1828 (!) 125     Resp 01/12/20 1828 20     Temp 01/12/20 1828 98.8 F (37.1 C)     Temp Source 01/12/20 1828 Oral     SpO2 01/12/20 1828 96 %     Weight --      Height --      Head Circumference --      Peak Flow --      Pain Score 01/12/20 1845 0     Pain Loc --      Pain Edu? --      Excl. in GC? --     Physical Exam Vitals and nursing note reviewed.  Constitutional:      Appearance: He is well-developed.  HENT:     Head: Normocephalic and atraumatic.     Nose: Nose normal.     Mouth/Throat:     Mouth: Mucous membranes are moist.  Eyes:     Conjunctiva/sclera: Conjunctivae normal.  Cardiovascular:     Rate and Rhythm: Normal rate and regular rhythm.     Heart sounds: No murmur heard.   Pulmonary:     Effort: Pulmonary effort is normal. No respiratory distress.     Breath sounds: Normal breath sounds.  Abdominal:     General: Abdomen is flat.     Palpations: Abdomen is soft.     Tenderness: There is no abdominal tenderness.  Genitourinary:    Comments: Foley catheter with cloudy/murky urine Musculoskeletal:     Cervical back: Neck supple.  Skin:    General: Skin is warm and dry.     Capillary Refill: Capillary refill takes less than 2 seconds.  Neurological:     General: No focal deficit present.     Mental Status: He is alert.     ED Results / Procedures / Treatments   Labs (all labs ordered are listed, but only abnormal results are displayed) Labs Reviewed  COMPREHENSIVE METABOLIC PANEL -  Abnormal; Notable for the following components:      Result Value   Sodium 129 (*)    Chloride 95 (*)    Glucose, Bld 102 (*)    Creatinine, Ser 0.60 (*)    Albumin 3.3 (*)    All other components within normal limits  CBC WITH DIFFERENTIAL/PLATELET - Abnormal; Notable for the following components:   WBC 18.9 (*)    RBC 3.05 (*)    Hemoglobin 9.5 (*)    HCT 29.3 (*)    Neutro Abs 16.8 (*)    Lymphs Abs 0.5 (*)    Monocytes Absolute 1.3 (*)    Abs Immature Granulocytes 0.23 (*)    All other components within normal limits  URINALYSIS, ROUTINE W REFLEX MICROSCOPIC - Abnormal; Notable for the following components:   APPearance HAZY (*)    Hgb urine dipstick MODERATE (*)    Ketones, ur 5 (*)    Leukocytes,Ua LARGE (*)    WBC, UA >50 (*)    Bacteria, UA MANY (*)    All other components within normal limits  SARS CORONAVIRUS 2 BY RT PCR (HOSPITAL ORDER, PERFORMED IN [facility])  CULTURE, BLOOD (ROUTINE X 2)  CULTURE, BLOOD (ROUTINE X 2)  URINE CULTURE  LACTIC ACID, PLASMA  PROTIME-INR  APTT  LIPASE, BLOOD  LACTIC ACID, PLASMA    EKG None  Radiology DG Chest 1 View  Result Date: 01/12/2020 CLINICAL DATA:  Urinary tract infection.  Sepsis. EXAM: CHEST  1 VIEW COMPARISON:  10/28/2013 and  overlapping portions of CT abdomen from 12/16/2019 FINDINGS: Stable linear subsegmental atelectasis or scarring in the lower lobes. Atherosclerotic calcification of the aortic arch. Prior CABG. No edema. IMPRESSION: 1. Stable linear subsegmental atelectasis or scarring in the lower lobes. 2. Prior CABG. 3. Atherosclerosis. Electronically Signed   By: Gaylyn Rong M.D.   On: 01/12/2020 20:44    Procedures .Critical Care Performed by: Virgina Norfolk, DO Authorized by: Virgina Norfolk, DO   Critical care provider statement:    Critical care time (minutes):  45   Critical care was necessary to treat or prevent imminent or life-threatening deterioration of the following  conditions:  Sepsis   Critical care was time spent personally by me on the following activities:  Blood draw for specimens, development of treatment plan with patient or surrogate, discussions with primary provider, evaluation of patient's response to treatment, examination of patient, obtaining history from patient or surrogate, ordering and performing treatments and interventions, ordering and review of laboratory studies, ordering and review of radiographic studies, pulse oximetry, re-evaluation of patient's condition and review of old charts   I assumed direction of critical care for this patient from another provider in my specialty: no     (including critical care time)  Medications Ordered in ED Medications  ondansetron (ZOFRAN) injection 4 mg (4 mg Intravenous Refused 01/12/20 2136)  lactated ringers bolus 1,000 mL (1,000 mLs Intravenous New Bag/Given 01/12/20 2018)  cefTRIAXone (ROCEPHIN) 2 g in sodium chloride 0.9 % 100 mL IVPB (0 g Intravenous Stopped 01/12/20 2135)  acetaminophen (TYLENOL) tablet 650 mg (650 mg Oral Given 01/12/20 2030)  lactated ringers bolus 1,000 mL (1,000 mLs Intravenous New Bag/Given 01/12/20 2135)    ED Course  I have reviewed the triage vital signs and the nursing notes.  Pertinent labs & imaging results that were available during my care of the patient were reviewed by me and considered in my medical decision making (see chart for details).    MDM Rules/Calculators/A&P                          Jason Tate is a 74 year old male with history of ALS, bedbound, with chronic indwelling Foley catheter presents the ED with fever.  Patient with fever and tachycardia.  Concern for sepsis.  Likely urinary source given chronic indwelling Foley catheter.  Urine looks cloudy murky.  Denies any cough, abdominal pain.  Has had Covid vaccination.  Will give IV Rocephin, IV fluids, Tylenol.  Will get sepsis labs and anticipate admission for further care.  Urinalysis  consistent with infection.  White count of 18.  Lactic acid is normal.  Covid test is negative.  No significant anemia, electrolyte abnormality otherwise.  Patient likely with sepsis from urinary tract infection.  Will admit for further sepsis care.  On recheck patient's mentation is improving.  Heart rate is improving.  Fever has resolved.  This chart was dictated using voice recognition software.  Despite best efforts to proofread,  errors can occur which can change the documentation meaning.    Final Clinical Impression(s) / ED Diagnoses Final diagnoses:  Sepsis, due to unspecified organism, unspecified whether acute organ dysfunction present [facility])  Urinary tract infection associated with indwelling urethral catheter, initial encounter [facility])    Rx / DC Orders ED Discharge Orders    None       Virgina Norfolk, DO 01/12/20 2154

## 2020-01-12 NOTE — H&P (Signed)
History and Physical    Jason Tate ZOX:[id number] DOB: 03/17/1946 DOA: 01/12/2020  PCP: Maple HudsonGilbert, Richard L Jr., MD  Patient coming from: Home  I have personally briefly reviewed patient's [facility]  Chief Complaint: AMS, fever  HPI: Jason SouCharles L Honda is a 74 y.o. male with medical history significant of ALS, chronic indwelling foley.  Pt presents to ED with fever and generalized weakness.  No cough nor abd pain, no CP, no SOB.   ED Course: Tm 102.3, HR 125, WBC 18.9k.  BP 163/102.  Pt given 2L IVF bolus, given rocephin.  UA highly suggestive of UTI with large LE, many bacteria, > 50 WBC / HPF.  COVID neg.  CXR neg.   Review of Systems: As per HPI, otherwise all review of systems negative.  Past Medical History:  Diagnosis Date  . Abnormal stress echo Oct 2012  . 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 Arthritis   . CAD (coronary artery disease) October 2012   s/p CABG x 2 per Dr. Cornelius Moraswen  . Gait abnormality 12/25/2018  . Hypercholesterolemia   . Low testosterone   . Macular degeneration   . 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 Obesity   . PVC (premature ventricular contraction)   . Sleep apnea, obstructive    on BiPAP    Past Surgical History:  Procedure Laterality Date  . COLONOSCOPY    . CORONARY ARTERY BYPASS GRAFT  Oct 2012   LIMA to LAD and left radial to OM  . HEMORRHOIDECTOMY WITH HEMORRHOID BANDING    . INJECTION KNEE       reports that he quit smoking about 39 years ago. His smoking use included cigarettes. He has a 20.00 pack-year smoking history. He has never used smokeless tobacco. He reports current alcohol use of about 28.0 standard drinks of alcohol per week. He reports that he does not use drugs.  Allergies  Allergen Reactions  . Penicillins Nausea Only    Family History  Problem Relation Age of Onset  . Heart disease Mother   . Stroke Father   . Heart disease  Father   . Dementia Father      Prior to Admission medications   Medication Sig Start Date End Date Taking? Authorizing Provider  allopurinol (ZYLOPRIM) 100 MG tablet Take 1 tablet (100 mg total) by mouth daily. 07/14/19  Yes Maple HudsonGilbert, Richard L Jr., MD  ALPRAZolam Prudy Feeler(XANAX) 1 MG tablet Take 1 tablet (1 mg total) by mouth 3 (three) times daily as needed for anxiety. 01/12/20  Yes York SpanielWillis, Vaibhav K, MD  buPROPion (WELLBUTRIN XL) 300 MG 24 hr tablet Take 1 tablet (300 mg total) by mouth daily. 07/14/19  Yes Maple HudsonGilbert, Richard L Jr., MD  Cetirizine HCl (ZYRTEC ALLERGY PO) Take 1 tablet by mouth daily as needed (allergies).    Yes [provider]  citalopram (CELEXA) 20 MG tablet Take 1 tablet (20 mg total) by mouth daily. 07/14/19  Yes Maple HudsonGilbert, Richard L Jr., MD  clonazePAM (KLONOPIN) 1 MG tablet Take 1 tablet (1 mg total) by mouth at bedtime as needed. for sleep 07/14/19  Yes Maple HudsonGilbert, Richard L Jr., MD  Copper 5 MG TABS Take 5 mg by mouth daily.   Yes [provider]  fluticasone (FLONASE) 50 MCG/ACT nasal spray Place 1 spray into both nostrils daily as needed for allergies. 09/11/17  Yes Maple HudsonGilbert, Richard L Jr., MD  HYDROcodone-acetaminophen (NORCO/VICODIN) 5-325 MG tablet Take 1  tablet by mouth every 6 (six) hours as needed for moderate pain. 01/07/20  Yes Dohmeier, Porfirio Mylar, MD  ibuprofen (ADVIL) 200 MG tablet Take 400 mg by mouth at bedtime as needed.    Yes [provider]  nabumetone (RELAFEN) 750 MG tablet Take 1 tablet (750 mg total) by mouth daily. Patient taking differently: Take 750 mg by mouth 2 (two) times daily.  09/14/19  Yes [name]., MD  NON FORMULARY TUDCA 1 gm 2 per day   Yes [provider]  omeprazole (PRILOSEC) 20 MG capsule Take 1 capsule (20 mg total) by mouth daily. 11/18/19  Yes [name]., MD  riluzole (RILUTEK) 50 MG tablet Take 50 mg by mouth every 12 (twelve) hours.   Yes [provider]  senna-docusate  (SENOKOT-S) 8.6-50 MG tablet Take 1 tablet by mouth daily. 12/16/19  Yes Alvira Monday, MD  simvastatin (ZOCOR) 20 MG tablet Take 1 tablet (20 mg total) by mouth at bedtime. 07/14/19  Yes [name]., MD  tamsulosin (FLOMAX) 0.4 MG CAPS capsule Take 0.4 mg by mouth daily. 12/23/19  Yes [provider]  aspirin 81 MG tablet Take 81 mg by mouth daily.    [provider]    Physical Exam: Vitals:   01/12/20 1917 01/12/20 2026 01/12/20 2130 01/12/20 2138  BP: (!) 148/91 (!) 141/80 (!) 163/102   Pulse: (!) 128 (!) 125 (!) 125   Resp: 20 (!) 22 19   Temp: (!) 101.1 F (38.4 C)   99.4 F (37.4 C)  TempSrc: Oral   Oral  SpO2: 95% 95% 97%     Constitutional: NAD, calm, comfortable Eyes: PERRL, lids and conjunctivae normal ENMT: Mucous membranes are moist. Posterior pharynx clear of any exudate or lesions.Normal dentition.  Neck: normal, supple, no masses, no thyromegaly Respiratory: clear to auscultation bilaterally, no wheezing, no crackles. Normal respiratory effort. No accessory muscle use.  Cardiovascular: Regular rate and rhythm, no murmurs / rubs / gallops. No extremity edema. 2+ pedal pulses. No carotid bruits.  Abdomen: no tenderness, no masses palpated. No hepatosplenomegaly. Bowel sounds positive.  Musculoskeletal: no clubbing / cyanosis. No joint deformity upper and lower extremities. Good ROM, no contractures. Normal muscle tone.  Skin: no rashes, lesions, ulcers. No induration Neurologic: CN 2-12 grossly intact. Sensation intact, DTR normal. Strength 5/5 in all 4.  Psychiatric: Mild confusion, oriented to self and knows hes in a hospital.   Labs on Admission: I have personally reviewed following labs and imaging studies  CBC: Recent Labs  Lab 01/12/20 2021  WBC 18.9*  NEUTROABS 16.8*  HGB 9.5*  HCT 29.3*  MCV 96.1  PLT 298   Basic Metabolic Panel: Recent Labs  Lab 01/12/20 2021  NA 129*  K 4.0  CL 95*  CO2 22  GLUCOSE 102*  BUN 13   CREATININE 0.60*  CALCIUM 9.0   GFR: CrCl cannot be calculated (Unknown ideal weight.). Liver Function Tests: Recent Labs  Lab 01/12/20 2021  AST 15  ALT 19  ALKPHOS 92  BILITOT 1.2  PROT 7.1  ALBUMIN 3.3*   Recent Labs  Lab 01/12/20 2022  LIPASE 21   No results for input(s): AMMONIA in the last 168 hours. Coagulation Profile: Recent Labs  Lab 01/12/20 2021  INR 1.1   Cardiac Enzymes: No results for input(s): CKTOTAL, CKMB, CKMBINDEX, TROPONINI in the last 168 hours. BNP (last 3 results) No results for input(s): PROBNP in the last 8760 hours. HbA1C: No results  for input(s): HGBA1C in the last 72 hours. CBG: No results for input(s): GLUCAP in the last 168 hours. Lipid Profile: No results for input(s): CHOL, HDL, LDLCALC, TRIG, CHOLHDL, LDLDIRECT in the last 72 hours. Thyroid Function Tests: No results for input(s): TSH, T4TOTAL, FREET4, T3FREE, THYROIDAB in the last 72 hours. Anemia Panel: No results for input(s): VITAMINB12, FOLATE, FERRITIN, TIBC, IRON, RETICCTPCT in the last 72 hours. Urine analysis:    Component Value Date/Time   COLORURINE YELLOW 01/12/2020 2022   APPEARANCEUR HAZY (A) 01/12/2020 2022   LABSPEC 1.015 01/12/2020 2022   PHURINE 5.0 01/12/2020 2022   GLUCOSEU NEGATIVE 01/12/2020 2022   HGBUR MODERATE (A) 01/12/2020 2022   BILIRUBINUR NEGATIVE 01/12/2020 2022   KETONESUR 5 (A) 01/12/2020 2022   PROTEINUR NEGATIVE 01/12/2020 2022   UROBILINOGEN 0.2 03/22/2011 2140   NITRITE NEGATIVE 01/12/2020 2022   LEUKOCYTESUR LARGE (A) 01/12/2020 2022    Radiological Exams on Admission: DG Chest 1 View  Result Date: 01/12/2020 CLINICAL DATA:  Urinary tract infection.  Sepsis. EXAM: CHEST  1 VIEW COMPARISON:  10/28/2013 and overlapping portions of CT abdomen from 12/16/2019 FINDINGS: Stable linear subsegmental atelectasis or scarring in the lower lobes. Atherosclerotic calcification of the aortic arch. Prior CABG. No edema. IMPRESSION: 1. Stable  linear subsegmental atelectasis or scarring in the lower lobes. 2. Prior CABG. 3. Atherosclerosis. Electronically Signed   By: Gaylyn Rong M.D.   On: 01/12/2020 20:44    EKG: Independently reviewed.  Assessment/Plan Principal Problem:   Sepsis secondary to UTI [facility]) Active Problems:   CAD (coronary artery disease)   ALS (amyotrophic lateral sclerosis) (HCC)   Acute metabolic encephalopathy    1. Sepsis secondary to UTI - 1. Sepsis pathway 2. Cefepime per pharm 3. UCx, BCx, pending 4. IVF: 2L bolus in ED + 100 cc/hr LR 5. Repeat labs in AM 6. Lactate 0.8 2. Acute metabolic encephalopathy - 1. Secondary to #1 above 3. ALS - 1. Chronic, pt on hospice 2. DNR and MOST forms are in Epic chart 3. Cont riluzole 4. Cont other home meds 4. CAD - 1. Cont ASA 81  DVT prophylaxis: Lovenox Code Status: DNR - form in chart Family Communication: No family in room Disposition Plan: Home after sepsis improved Consults called: None Admission status: Admit to inpatient  Severity of Illness: The appropriate patient status for this patient is INPATIENT. Inpatient status is judged to be reasonable and necessary in order to provide the required intensity of service to ensure the patient's safety. The patient's presenting symptoms, physical exam findings, and initial radiographic and laboratory data in the context of their chronic comorbidities is felt to place them at high risk for further clinical deterioration. Furthermore, it is not anticipated that the patient will be medically stable for discharge from the hospital within 2 midnights of admission. The following factors support the patient status of inpatient.   IP status due to UTI with sepsis and AMS.   * I certify that at the point of admission it is my clinical judgment that the patient will require inpatient hospital care spanning beyond 2 midnights from the point of admission due to high intensity of service, high risk for  further deterioration and high frequency of surveillance required.*    Garmon Dehn M. DO [facility]  How to contact the [facility] Attending or Consulting provider 7A - 7P or covering provider during after hours 7P -7A, for this patient?  1. Check the care team in [facility] and look for a)  attending/consulting [facility] provider listed and b) the [facility] team listed 2. Log into [email or web address]  Amion Physician Scheduling and messaging for groups and whole hospitals  On call and physician scheduling software for group practices, residents, hospitalists and other medical providers for call, clinic, rotation and shift schedules. On[employer] is a hospital-wide system for scheduling doctors and paging doctors on call. EasyPlot is for scientific plotting and data analysis.  [email or web address]  and use [place]'s universal password to access. If you do not have the password, please contact the hospital operator.  3. Locate the [facility] provider you are looking for under [facility] and page to a number that you can be directly reached. 4. If you still have difficulty reaching the provider, please page the [facility] (Director on Call) for the Hospitalists listed on amion for assistance.  01/12/2020, 10:42 PM

## 2020-01-12 NOTE — Telephone Encounter (Signed)
I called the patient, he is not getting full benefit from the alprazolam.  I will increase the dose to help him rest at night, he is having a lot of anxiety at nighttime.  He will go to the 1 mg tablets taking 1 every 8 hours or so if needed.  He is not to take the clonazepam while taking the alprazolam.

## 2020-01-12 NOTE — Addendum Note (Signed)
Addended by: York Spaniel on: 01/12/2020 11:06 AM   Modules accepted: Orders

## 2020-01-12 NOTE — Telephone Encounter (Signed)
Pt called stating that he had a rough night last night with the feeling of anxiousness. Couldn't sleep or stay still. Pt would like to know if his medications can be adjusted. Please advise.

## 2020-01-12 NOTE — Progress Notes (Signed)
PHARMACIST - PHYSICIAN ORDER COMMUNICATION  CONCERNING: P&T Medication Policy on Herbal Medications  DESCRIPTION:  This patient's order for:  Copper  has been noted.  This product(s) is classified as an "herbal" or natural product. Due to a lack of definitive safety studies or FDA approval, nonstandard manufacturing practices, plus the potential risk of unknown drug-drug interactions while on inpatient medications, the Pharmacy and Therapeutics Committee does not permit the use of "herbal" or natural products of this type within [facility].   ACTION TAKEN: The pharmacy department is unable to verify this order at this time and your patient has been informed of this safety policy. Please reevaluate patient's clinical condition at discharge and address if the herbal or natural product(s) should be resumed at that time.  Terrilee Files, PharmD

## 2020-01-12 NOTE — Progress Notes (Signed)
Pharmacy Antibiotic Note  Jason Tate is a 74 y.o. male admitted on 01/12/2020 with UTI with concern for sepsis.  Received Ceftriaxone 2gm IV x 1 dose in the ED.  Pharmacy has been consulted for Cefepime dosing.  Last weight in Epic = 83.9 kg (12/16/19)  Plan: Cefepime 2gm IV q8h Follow culture results/sensitivities Follow renal function    Temp (24hrs), Avg:100.4 F (38 C), Min:98.8 F (37.1 C), Max:102.3 F (39.1 C)  Recent Labs  Lab 01/12/20 2021  WBC 18.9*  CREATININE 0.60*  LATICACIDVEN 0.8    CrCl cannot be calculated (Unknown ideal weight.).    Allergies  Allergen Reactions  . Penicillins Nausea Only    Antimicrobials this admission: 8/17 Ceftriaxone x 1 8/17 Cefepime >>    Dose adjustments this admission:    Microbiology results: 8/17 BCx:   8/17 UCx:   8/17 Covid: negative    Thank you for allowing pharmacy to be a part of this patient's care.  Maryellen Pile, PharmD 01/12/2020 10:48 PM

## 2020-01-12 NOTE — ED Notes (Signed)
Blood cultures were drawn prior to abx infusing@2010 

## 2020-01-13 LAB — COMPREHENSIVE METABOLIC PANEL
ALT: 16 U/L (ref 0–44)
AST: 14 U/L — ABNORMAL LOW (ref 15–41)
Albumin: 2.9 g/dL — ABNORMAL LOW (ref 3.5–5.0)
Alkaline Phosphatase: 81 U/L (ref 38–126)
Anion gap: 10 (ref 5–15)
BUN: 12 mg/dL (ref 8–23)
CO2: 26 mmol/L (ref 22–32)
Calcium: 8.8 mg/dL — ABNORMAL LOW (ref 8.9–10.3)
Chloride: 98 mmol/L (ref 98–111)
Creatinine, Ser: 0.53 mg/dL — ABNORMAL LOW (ref 0.61–1.24)
GFR calc Af Amer: >60 mL/min (ref >60)
GFR calc non Af Amer: >60 mL/min (ref >60)
Glucose, Bld: 102 mg/dL — ABNORMAL HIGH (ref 70–99)
Potassium: 3.7 mmol/L (ref 3.5–5.1)
Sodium: 134 mmol/L — ABNORMAL LOW (ref 135–145)
Total Bilirubin: 0.8 mg/dL (ref 0.3–1.2)
Total Protein: 6.1 g/dL — ABNORMAL LOW (ref 6.5–8.1)

## 2020-01-13 LAB — CBC
HCT: 25.8 % — ABNORMAL LOW (ref 39.0–52.0)
Hemoglobin: 8.2 g/dL — ABNORMAL LOW (ref 13.0–17.0)
MCH: 30.8 pg (ref 26.0–34.0)
MCHC: 31.8 g/dL (ref 30.0–36.0)
MCV: 97 fL (ref 80.0–100.0)
Platelets: 231 10*3/uL (ref 150–400)
RBC: 2.66 MIL/uL — ABNORMAL LOW (ref 4.22–5.81)
RDW: 15.7 % — ABNORMAL HIGH (ref 11.5–15.5)
WBC: 17.5 10*3/uL — ABNORMAL HIGH (ref 4.0–10.5)
nRBC: 0 % (ref 0.0–0.2)

## 2020-01-13 LAB — CORTISOL-AM, BLOOD: Cortisol - AM: 27.2 ug/dL — ABNORMAL HIGH (ref 6.7–22.6)

## 2020-01-13 LAB — PROCALCITONIN: Procalcitonin: 0.82 ng/mL

## 2020-01-13 LAB — PROTIME-INR
INR: 1.2 (ref 0.8–1.2)
Prothrombin Time: 15.1 seconds (ref 11.4–15.2)

## 2020-01-13 LAB — LACTIC ACID, PLASMA: Lactic Acid, Venous: 0.9 mmol/L (ref 0.5–1.9)

## 2020-01-13 NOTE — Assessment & Plan Note (Signed)
-  Considered due to UTI.  Mentation has significantly improved with treatment of infection and now back to normal baseline

## 2020-01-13 NOTE — Assessment & Plan Note (Signed)
Continue aspirin and statin. 

## 2020-01-13 NOTE — ED Notes (Signed)
Pt was eating fruit and stated he couldn't  Swallow  Informed the nurse and stop feeding the pt

## 2020-01-13 NOTE — Assessment & Plan Note (Addendum)
-  Patient had indwelling Foley catheter placed on 12/16/2019 after presenting to the ER with severe urinary retention and constipation.  There appears to be a component of improper drainage of the urine at home with catheter tubing above level of penis with what sounds to be significant retrograde flow per his wife and her description. -Foley catheter was exchanged on 01/12/2020 in the ER -Follow-up urine and blood cultures (remain NGTD x 2 days): UCx growing Pseudomonas and E. Coli.  Pansensitive -Patient descalated to Cipro to complete 5 more days for total of 7-day course at discharge

## 2020-01-13 NOTE — Hospital Course (Addendum)
Mr. Roarty is a 74 yo CM with PMH ALS (bedbound now), urinary retention (recently indwelling foley placed 12/16/19), CAD, HLD, gradual functional decline who presented to the ER with a fever and weakness at home.  He was subsequently found to have sepsis due to a UTI.  He was started on cefepime on admission.  After further collateral information from his wife, it appears that the tubing of his Foley catheter has been elevated above the level of his body at home before draining into the bag.  It was considered that this contributed to his development of his urinary infection.  They do have palliative care involved and his wife states that she also plans to bring hospice on especially given his significant physical decline over the past several months.  The morning following admission he had improved with ongoing IV antibiotics and fluids.  Urine culture had speciated to Pseudomonas and E. coli.  He was continued on cefepime while awaiting further sensitivities.  As of cultures matured further, they were noted to be pansensitive.  He was deescalated to ciprofloxacin to complete a total of a 7-day course of antibiotics (5 more days of Cipro on discharge) for treatment of complicated UTI.  His Foley catheter was exchanged on admission on 01/12/2020.

## 2020-01-13 NOTE — Progress Notes (Signed)
PROGRESS NOTE    Jason Tate   IOM:[id number]  DOB: 24-Nov-1945  DOA: 01/12/2020     1  PCP: [name]., MD  CC: fever  Hospital Course: [name] is a 74 yo CM with PMH ALS (bedbound now), urinary retention (recently indwelling foley placed 12/16/19), CAD, HLD, gradual functional decline who presented to the ER with a fever and weakness at home.  He was subsequently found to have sepsis due to a UTI.  He was started on cefepime on admission.  After further collateral information from his wife, it appears that the tubing of his Foley catheter has been elevated above the level of his body at home before draining into the bag.  It was considered that this contributed to his development of his urinary infection.  They do have palliative care involved and his wife states that she also plans to bring hospice on especially given his significant physical decline over the past several months.   Interval History:  No events overnight.  Seen laying in bed eating lunch being fed by his wife today.  They state that he is overall much better than when he came in and he has had no further fevers.  Old records reviewed in assessment of this patient  ROS: Constitutional: negative for fevers, Respiratory: negative for cough, Cardiovascular: negative for chest pain and Gastrointestinal: negative for abdominal pain  Assessment & Plan: CAD (coronary artery disease) -Continue aspirin and statin  ALS (amyotrophic lateral sclerosis) (HCC) -Patient is now bedbound.  Cared for by his wife.  Palliative care has been involved.  She also plans to bring on hospice soon after discharge -Continue riluzole  Acute metabolic encephalopathy -Considered due to UTI.  Mentation has significantly improved with treatment of infection and now back to normal baseline  Sepsis secondary to UTI [facility]) -Patient had indwelling Foley catheter placed on 12/16/2019 after presenting to the ER with severe urinary  retention and constipation.  There appears to be a component of improper drainage of the urine at home with catheter tubing above level of penis with what sounds to be significant retrograde flow per his wife and her description. -Foley catheter was exchanged on 01/12/2020 in the ER -Follow-up urine and blood cultures -Continue cefepime and will de-escalate as able   Antimicrobials: Cefepime 01/12/2020>> present Rocephin 2 g x 1 on 01/12/2020  DVT prophylaxis: Lovenox  Code Status: DNR Family Communication: Wife present bedside Disposition Plan:  Status is: Inpatient  Remains inpatient appropriate because:Unsafe d/c plan, IV treatments appropriate due to intensity of illness or inability to take PO and Inpatient level of care appropriate due to severity of illness   Dispo: The patient is from: Home              Anticipated d/c is to: Home              Anticipated d/c date is: 1 day              Patient currently is not medically stable to d/c.       Objective: Blood pressure (!) 147/88, pulse (!) 109, temperature 97.6 F (36.4 C), temperature source Oral, resp. rate (!) 23, SpO2 100 %.  Examination: General appearance: alert, cooperative and no distress Head: Normocephalic, without obvious abnormality, atraumatic Eyes: EOMI Lungs: clear to auscultation bilaterally Heart: regular rate and rhythm and S1, S2 normal Abdomen: normal findings: bowel sounds normal and soft, non-tender Extremities: 1+ lower extremity pitting edema Skin: mobility and  turgor normal Neurologic: Significant weakness appreciated in all extremities (1/5) except for left upper extremity which is approximately 4/5 with motor strength.  Consultants:   none  Procedures:   none  Data Reviewed: I have personally reviewed following labs and imaging studies Results for orders placed or performed during the hospital encounter of 01/12/20 (from the past 24 hour(s))  Lactic acid, plasma     Status: None    Collection Time: 01/12/20  8:21 PM  Result Value Ref Range   Lactic Acid, Venous 0.8 0.5 - 1.9 mmol/L  Comprehensive metabolic panel     Status: Abnormal   Collection Time: 01/12/20  8:21 PM  Result Value Ref Range   Sodium 129 (L) 135 - 145 mmol/L   Potassium 4.0 3.5 - 5.1 mmol/L   Chloride 95 (L) 98 - 111 mmol/L   CO2 22 22 - 32 mmol/L   Glucose, Bld 102 (H) 70 - 99 mg/dL   BUN 13 8 - 23 mg/dL   Creatinine, Ser 7.61 (L) 0.61 - 1.24 mg/dL   Calcium 9.0 8.9 - 95.0 mg/dL   Total Protein 7.1 6.5 - 8.1 g/dL   Albumin 3.3 (L) 3.5 - 5.0 g/dL   AST 15 15 - 41 U/L   ALT 19 0 - 44 U/L   Alkaline Phosphatase 92 38 - 126 U/L   Total Bilirubin 1.2 0.3 - 1.2 mg/dL   GFR calc non Af Amer >60 >60 mL/min   GFR calc Af Amer >60 >60 mL/min   Anion gap 12 5 - 15  CBC WITH DIFFERENTIAL     Status: Abnormal   Collection Time: 01/12/20  8:21 PM  Result Value Ref Range   WBC 18.9 (H) 4.0 - 10.5 K/uL   RBC 3.05 (L) 4.22 - 5.81 MIL/uL   Hemoglobin 9.5 (L) 13.0 - 17.0 g/dL   HCT 93.2 (L) 39 - 52 %   MCV 96.1 80.0 - 100.0 fL   MCH 31.1 26.0 - 34.0 pg   MCHC 32.4 30.0 - 36.0 g/dL   RDW 67.1 24.5 - 80.9 %   Platelets 298 150 - 400 K/uL   nRBC 0.0 0.0 - 0.2 %   Neutrophils Relative % 89 %   Neutro Abs 16.8 (H) 1.7 - 7.7 K/uL   Lymphocytes Relative 3 %   Lymphs Abs 0.5 (L) 0.7 - 4.0 K/uL   Monocytes Relative 7 %   Monocytes Absolute 1.3 (H) 0 - 1 K/uL   Eosinophils Relative 0 %   Eosinophils Absolute 0.0 0 - 0 K/uL   Basophils Relative 0 %   Basophils Absolute 0.1 0 - 0 K/uL   Immature Granulocytes 1 %   Abs Immature Granulocytes 0.23 (H) 0.00 - 0.07 K/uL  Protime-INR     Status: None   Collection Time: 01/12/20  8:21 PM  Result Value Ref Range   Prothrombin Time 13.9 11.4 - 15.2 seconds   INR 1.1 0.8 - 1.2  APTT     Status: None   Collection Time: 01/12/20  8:21 PM  Result Value Ref Range   aPTT 35 24 - 36 seconds  Blood Culture (routine x 2)     Status: None (Preliminary result)    Collection Time: 01/12/20  8:22 PM   Specimen: BLOOD  Result Value Ref Range   Specimen Description      BLOOD SITE NOT SPECIFIED Performed at [facility], [address], [place], [place] [address]    Special Requests  BOTTLES DRAWN AEROBIC AND ANAEROBIC Blood Culture adequate volume Performed at [facility], [address] [place], [place] [address]    Culture      NO GROWTH < 12 HOURS Performed at [facility], [address] [place], [place] [address]    Report Status PENDING   Blood Culture (routine x 2)     Status: None (Preliminary result)   Collection Time: 01/12/20  8:22 PM   Specimen: BLOOD  Result Value Ref Range   Specimen Description      BLOOD SITE NOT SPECIFIED Performed at [facility], [address] [place], [place] [address]    Special Requests      BOTTLES DRAWN AEROBIC AND ANAEROBIC Blood Culture adequate volume Performed at [facility], [address] [place], [place] [address]    Culture      NO GROWTH < 12 HOURS Performed at [facility], [address] [place], [place] [address]    Report Status PENDING   Urinalysis, Routine w reflex microscopic     Status: Abnormal   Collection Time: 01/12/20  8:22 PM  Result Value Ref Range   Color, Urine YELLOW YELLOW   APPearance HAZY (A) CLEAR   Specific Gravity, Urine 1.015 1.005 - 1.030   pH 5.0 5.0 - 8.0   Glucose, UA NEGATIVE NEGATIVE mg/dL   Hgb urine dipstick MODERATE (A) NEGATIVE   Bilirubin Urine NEGATIVE NEGATIVE   Ketones, ur 5 (A) NEGATIVE mg/dL   Protein, ur NEGATIVE NEGATIVE mg/dL   Nitrite NEGATIVE NEGATIVE   Leukocytes,Ua LARGE (A) NEGATIVE   RBC / HPF 21-50 0 - 5 RBC/hpf   WBC, UA >50 (H) 0 - 5 WBC/hpf   Bacteria, UA MANY (A) NONE SEEN   Squamous Epithelial / LPF 0-5 0 - 5   WBC Clumps PRESENT    Mucus PRESENT    Amorphous Crystal PRESENT    Sperm, UA PRESENT   SARS Coronavirus 2 by RT  PCR (hospital order, performed in [facility] lab) Nasopharyngeal Nasopharyngeal Swab     Status: None   Collection Time: 01/12/20  8:22 PM   Specimen: Nasopharyngeal Swab  Result Value Ref Range   SARS Coronavirus 2 NEGATIVE NEGATIVE  Lipase, blood     Status: None   Collection Time: 01/12/20  8:22 PM  Result Value Ref Range   Lipase 21 11 - 51 U/L  Lactic acid, plasma     Status: None   Collection Time: 01/13/20  3:58 AM  Result Value Ref Range   Lactic Acid, Venous 0.9 0.5 - 1.9 mmol/L  Protime-INR     Status: None   Collection Time: 01/13/20  3:58 AM  Result Value Ref Range   Prothrombin Time 15.1 11.4 - 15.2 seconds   INR 1.2 0.8 - 1.2  Cortisol-am, blood     Status: Abnormal   Collection Time: 01/13/20  3:58 AM  Result Value Ref Range   Cortisol - AM 27.2 (H) 6.7 - 22.6 ug/dL  Procalcitonin     Status: None   Collection Time: 01/13/20  3:58 AM  Result Value Ref Range   Procalcitonin 0.82 ng/mL  CBC     Status: Abnormal   Collection Time: 01/13/20  3:58 AM  Result Value Ref Range   WBC 17.5 (H) 4.0 - 10.5 K/uL   RBC 2.66 (L) 4.22 - 5.81 MIL/uL   Hemoglobin 8.2 (L) 13.0 - 17.0 g/dL   HCT  25.8 (L) 39 - 52 %   MCV 97.0 80.0 - 100.0 fL   MCH 30.8 26.0 - 34.0 pg   MCHC 31.8 30.0 - 36.0 g/dL   RDW 78.6 (H) 76.7 - 20.9 %   Platelets 231 150 - 400 K/uL   nRBC 0.0 0.0 - 0.2 %  Comprehensive metabolic panel     Status: Abnormal   Collection Time: 01/13/20  3:58 AM  Result Value Ref Range   Sodium 134 (L) 135 - 145 mmol/L   Potassium 3.7 3.5 - 5.1 mmol/L   Chloride 98 98 - 111 mmol/L   CO2 26 22 - 32 mmol/L   Glucose, Bld 102 (H) 70 - 99 mg/dL   BUN 12 8 - 23 mg/dL   Creatinine, Ser 4.70 (L) 0.61 - 1.24 mg/dL   Calcium 8.8 (L) 8.9 - 10.3 mg/dL   Total Protein 6.1 (L) 6.5 - 8.1 g/dL   Albumin 2.9 (L) 3.5 - 5.0 g/dL   AST 14 (L) 15 - 41 U/L   ALT 16 0 - 44 U/L   Alkaline Phosphatase 81 38 - 126 U/L   Total Bilirubin 0.8 0.3 - 1.2 mg/dL   GFR calc non Af Amer  >60 >60 mL/min   GFR calc Af Amer >60 >60 mL/min   Anion gap 10 5 - 15    Recent Results (from the past 240 hour(s))  Blood Culture (routine x 2)     Status: None (Preliminary result)   Collection Time: 01/12/20  8:22 PM   Specimen: BLOOD  Result Value Ref Range Status   Specimen Description   Final    BLOOD SITE NOT SPECIFIED Performed at [facility], [address], [place], [place] [address]    Special Requests   Final    BOTTLES DRAWN AEROBIC AND ANAEROBIC Blood Culture adequate volume Performed at [facility], [address] [place], [place] [address]    Culture   Final    NO GROWTH < 12 HOURS Performed at [facility], [address] [place], [place] [address]    Report Status PENDING  Incomplete  Blood Culture (routine x 2)     Status: None (Preliminary result)   Collection Time: 01/12/20  8:22 PM   Specimen: BLOOD  Result Value Ref Range Status   Specimen Description   Final    BLOOD SITE NOT SPECIFIED Performed at [facility], [address] [place], [place] [address]    Special Requests   Final    BOTTLES DRAWN AEROBIC AND ANAEROBIC Blood Culture adequate volume Performed at [facility], [address], [place], [place] [address]    Culture   Final    NO GROWTH < 12 HOURS Performed at [facility], [address] [place], [place] [address]    Report Status PENDING  Incomplete  SARS Coronavirus 2 by RT PCR (hospital order, performed in [facility] hospital lab) Nasopharyngeal Nasopharyngeal Swab     Status: None   Collection Time: 01/12/20  8:22 PM   Specimen: Nasopharyngeal Swab  Result Value Ref Range Status   SARS Coronavirus 2 NEGATIVE NEGATIVE Final    Comment: (NOTE) SARS-CoV-2 target nucleic acids are NOT DETECTED.  The SARS-CoV-2 RNA is generally detectable in upper and lower respiratory specimens during the acute phase of infection. The  lowest concentration of SARS-CoV-2 viral copies this assay can detect is 250 copies / mL. A negative result does not  preclude SARS-CoV-2 infection and should not be used as the sole basis for treatment or other patient management decisions.  A negative result may occur with improper specimen collection / handling, submission of specimen other than nasopharyngeal swab, presence of viral mutation(s) within the areas targeted by this assay, and inadequate number of viral copies (<250 copies / mL). A negative result must be combined with clinical observations, patient history, and epidemiological information.  Fact Sheet for Patients:   BoilerBrush.com.cy  Fact Sheet for Healthcare Providers: https://pope.com/  This test is not yet approved or  cleared by the [place] FDA and has been authorized for detection and/or diagnosis of SARS-CoV-2 by FDA under an Emergency Use Authorization (EUA).  This EUA will remain in effect (meaning this test can be used) for the duration of the COVID-19 declaration under Section 564(b)(1) of the Act, 21 U.S.C. section 360bbb-3(b)(1), unless the authorization is terminated or revoked sooner.  Performed at [facility], [address] [place], [place] [address]      Radiology Studies: DG Chest 1 View  Result Date: 01/12/2020 CLINICAL DATA:  Urinary tract infection.  Sepsis. EXAM: CHEST  1 VIEW COMPARISON:  10/28/2013 and overlapping portions of CT abdomen from 12/16/2019 FINDINGS: Stable linear subsegmental atelectasis or scarring in the lower lobes. Atherosclerotic calcification of the aortic arch. Prior CABG. No edema. IMPRESSION: 1. Stable linear subsegmental atelectasis or scarring in the lower lobes. 2. Prior CABG. 3. Atherosclerosis. Electronically Signed   By: Gaylyn Rong M.D.   On: 01/12/2020 20:44   DG Chest 1 View  Final Result       Scheduled Meds: .  allopurinol  100 mg Oral Daily  . aspirin  81 mg Oral Daily  . buPROPion  300 mg Oral Daily  . citalopram  20 mg Oral Daily  . enoxaparin (LOVENOX) injection  40 mg Subcutaneous Q24H  . nabumetone  750 mg Oral BID  . ondansetron (ZOFRAN) IV  4 mg Intravenous Once  . pantoprazole  40 mg Oral Daily  . riluzole  50 mg Oral Q12H  . senna-docusate  1 tablet Oral Daily  . simvastatin  20 mg Oral QHS  . tamsulosin  0.4 mg Oral Daily   PRN Meds: acetaminophen **OR** acetaminophen, ALPRAZolam, clonazePAM, fluticasone, HYDROcodone-acetaminophen, ibuprofen, ondansetron **OR** ondansetron (ZOFRAN) IV Continuous Infusions: . ceFEPime (MAXIPIME) IV Stopped (01/13/20 0845)  . lactated ringers Stopped (01/13/20 0845)      LOS: 1 day  Time spent: Greater than 50% of the 35 minute visit was spent in counseling/coordination of care for the patient as laid out in the A&P.   Lewie Chamber, MD [facility] 01/13/2020, 2:39 PM   Contact via secure chat.  To contact the attending provider between 7A-7P or the covering provider during after hours 7P-7A, please log into the web site [email or web address] and access using universal [place] password for that web site. If you do not have the password, please call the hospital operator.

## 2020-01-13 NOTE — ED Notes (Signed)
Wife at bedside.

## 2020-01-13 NOTE — Assessment & Plan Note (Signed)
-  Patient is now bedbound.  Cared for by his wife.  Palliative care has been involved.  She also plans to bring on hospice soon after discharge -Continue riluzole

## 2020-01-14 LAB — CBC WITH DIFFERENTIAL/PLATELET
Abs Immature Granulocytes: 0.26 10*3/uL — ABNORMAL HIGH (ref 0.00–0.07)
Basophils Absolute: 0.1 10*3/uL (ref 0.0–0.1)
Basophils Relative: 0 %
Eosinophils Absolute: 0.1 10*3/uL (ref 0.0–0.5)
Eosinophils Relative: 1 %
HCT: 24.5 % — ABNORMAL LOW (ref 39.0–52.0)
Hemoglobin: 8 g/dL — ABNORMAL LOW (ref 13.0–17.0)
Immature Granulocytes: 2 %
Lymphocytes Relative: 6 %
Lymphs Abs: 0.8 10*3/uL (ref 0.7–4.0)
MCH: 31.7 pg (ref 26.0–34.0)
MCHC: 32.7 g/dL (ref 30.0–36.0)
MCV: 97.2 fL (ref 80.0–100.0)
Monocytes Absolute: 1 10*3/uL (ref 0.1–1.0)
Monocytes Relative: 7 %
Neutro Abs: 11.7 10*3/uL — ABNORMAL HIGH (ref 1.7–7.7)
Neutrophils Relative %: 84 %
Platelets: 204 10*3/uL (ref 150–400)
RBC: 2.52 MIL/uL — ABNORMAL LOW (ref 4.22–5.81)
RDW: 15.3 % (ref 11.5–15.5)
WBC: 13.9 10*3/uL — ABNORMAL HIGH (ref 4.0–10.5)
nRBC: 0 % (ref 0.0–0.2)

## 2020-01-14 LAB — BASIC METABOLIC PANEL
Anion gap: 9 (ref 5–15)
BUN: 10 mg/dL (ref 8–23)
CO2: 24 mmol/L (ref 22–32)
Calcium: 8.7 mg/dL — ABNORMAL LOW (ref 8.9–10.3)
Chloride: 101 mmol/L (ref 98–111)
Creatinine, Ser: 0.56 mg/dL — ABNORMAL LOW (ref 0.61–1.24)
GFR calc Af Amer: >60 mL/min (ref >60)
GFR calc non Af Amer: >60 mL/min (ref >60)
Glucose, Bld: 92 mg/dL (ref 70–99)
Potassium: 3.3 mmol/L — ABNORMAL LOW (ref 3.5–5.1)
Sodium: 134 mmol/L — ABNORMAL LOW (ref 135–145)

## 2020-01-14 LAB — MAGNESIUM: Magnesium: 1.8 mg/dL (ref 1.7–2.4)

## 2020-01-14 MED ORDER — POTASSIUM CHLORIDE CRYS ER 20 MEQ PO TBCR
40.0000 meq | EXTENDED_RELEASE_TABLET | Freq: Once | ORAL | Status: AC
  Administered 2020-01-14: 40 meq via ORAL
  Filled 2020-01-14: qty 2

## 2020-01-14 MED ORDER — CHLORHEXIDINE GLUCONATE CLOTH 2 % EX PADS
6.0000 | MEDICATED_PAD | Freq: Every day | CUTANEOUS | Status: DC
  Administered 2020-01-14 – 2020-01-15 (×2): 6 via TOPICAL

## 2020-01-14 NOTE — ED Notes (Signed)
Trey Paula RN will call back in a few minutes.

## 2020-01-14 NOTE — Progress Notes (Signed)
PROGRESS NOTE    Jason Tate   FUX:[id number]  DOB: 1946/01/17  DOA: 01/12/2020     2  PCP: [name]., MD  CC: fever  Hospital Course: Jason Tate is a 74 yo CM with PMH ALS (bedbound now), urinary retention (recently indwelling foley placed 12/16/19), CAD, HLD, gradual functional decline who presented to the ER with a fever and weakness at home.  He was subsequently found to have sepsis due to a UTI.  He was started on cefepime on admission.  After further collateral information from his wife, it appears that the tubing of his Foley catheter has been elevated above the level of his body at home before draining into the bag.  It was considered that this contributed to his development of his urinary infection.  They do have palliative care involved and his wife states that she also plans to bring hospice on especially given his significant physical decline over the past several months.  The morning following admission he had improved with ongoing IV antibiotics and fluids.  Urine culture had speciated to Pseudomonas and E. coli.  He was continued on cefepime while awaiting further sensitivities.   Interval History:  No events overnight.  Patient still in ER awaiting a bed.  He is much more comfortable.  Wife is not present today.  He is also stating that he feels better and is hoping to go home soon.  Old records reviewed in assessment of this patient  ROS: Constitutional: negative for fevers, Respiratory: negative for cough, Cardiovascular: negative for chest pain and Gastrointestinal: negative for abdominal pain  Assessment & Plan: CAD (coronary artery disease) -Continue aspirin and statin  ALS (amyotrophic lateral sclerosis) (HCC) -Patient is now bedbound.  Cared for by his wife.  Palliative care has been involved.  She also plans to bring on hospice soon after discharge -Continue riluzole  Acute metabolic encephalopathy -Considered due to UTI.  Mentation has  significantly improved with treatment of infection and now back to normal baseline  Sepsis secondary to UTI [facility]) -Patient had indwelling Foley catheter placed on 12/16/2019 after presenting to the ER with severe urinary retention and constipation.  There appears to be a component of improper drainage of the urine at home with catheter tubing above level of penis with what sounds to be significant retrograde flow per his wife and her description. -Foley catheter was exchanged on 01/12/2020 in the ER -Follow-up urine and blood cultures (remain NGTD x 2 days): UCx growing Pseudomonas and E. coli -Continue cefepime.  Still awaiting sensitivities of cultures as well as finalization -Likely will de-escalate to oral agent in 1 to 2 days if patient continues to clinically improve   Antimicrobials: Cefepime 01/12/2020>> present Rocephin 2 g x 1 on 01/12/2020  DVT prophylaxis: Lovenox  Code Status: DNR Family Communication: Wife present bedside Disposition Plan:  Status is: Inpatient  Remains inpatient appropriate because:Unsafe d/c plan, IV treatments appropriate due to intensity of illness or inability to take PO and Inpatient level of care appropriate due to severity of illness   Dispo: The patient is from: Home              Anticipated d/c is to: Home              Anticipated d/c date is: 1 day              Patient currently is not medically stable to d/c.  Objective: Blood pressure (!) 159/92, pulse (!) 102,  temperature (!) 97.4 F (36.3 C), temperature source Oral, resp. rate 14, height 6\' 1"  (1.854 m), weight 81.6 kg, SpO2 100 %.  Examination: General appearance: alert, cooperative and no distress Head: Normocephalic, without obvious abnormality, atraumatic Eyes: EOMI Lungs: clear to auscultation bilaterally Heart: regular rate and rhythm and S1, S2 normal Abdomen: normal findings: bowel sounds normal and soft, non-tender Extremities: 1+ lower extremity pitting edema Skin: mobility  and turgor normal Neurologic: Significant weakness appreciated in all extremities (1/5) except for left upper extremity which is approximately 4/5 with motor strength.  Consultants:   none  Procedures:   none  Data Reviewed: I have personally reviewed following labs and imaging studies Results for orders placed or performed during the hospital encounter of 01/12/20 (from the past 24 hour(s))  Basic metabolic panel     Status: Abnormal   Collection Time: 01/14/20  4:36 AM  Result Value Ref Range   Sodium 134 (L) 135 - 145 mmol/L   Potassium 3.3 (L) 3.5 - 5.1 mmol/L   Chloride 101 98 - 111 mmol/L   CO2 24 22 - 32 mmol/L   Glucose, Bld 92 70 - 99 mg/dL   BUN 10 8 - 23 mg/dL   Creatinine, Ser 1.610.56 (L) 0.61 - 1.24 mg/dL   Calcium 8.7 (L) 8.9 - 10.3 mg/dL   GFR calc non Af Amer >60 >60 mL/min   GFR calc Af Amer >60 >60 mL/min   Anion gap 9 5 - 15  CBC with Differential/Platelet     Status: Abnormal   Collection Time: 01/14/20  4:36 AM  Result Value Ref Range   WBC 13.9 (H) 4.0 - 10.5 K/uL   RBC 2.52 (L) 4.22 - 5.81 MIL/uL   Hemoglobin 8.0 (L) 13.0 - 17.0 g/dL   HCT 09.624.5 (L) 39 - 52 %   MCV 97.2 80.0 - 100.0 fL   MCH 31.7 26.0 - 34.0 pg   MCHC 32.7 30.0 - 36.0 g/dL   RDW 04.515.3 40.911.5 - 81.115.5 %   Platelets 204 150 - 400 K/uL   nRBC 0.0 0.0 - 0.2 %   Neutrophils Relative % 84 %   Neutro Abs 11.7 (H) 1.7 - 7.7 K/uL   Lymphocytes Relative 6 %   Lymphs Abs 0.8 0.7 - 4.0 K/uL   Monocytes Relative 7 %   Monocytes Absolute 1.0 0 - 1 K/uL   Eosinophils Relative 1 %   Eosinophils Absolute 0.1 0 - 0 K/uL   Basophils Relative 0 %   Basophils Absolute 0.1 0 - 0 K/uL   Immature Granulocytes 2 %   Abs Immature Granulocytes 0.26 (H) 0.00 - 0.07 K/uL  Magnesium     Status: None   Collection Time: 01/14/20  4:36 AM  Result Value Ref Range   Magnesium 1.8 1.7 - 2.4 mg/dL    Recent Results (from the past 240 hour(s))  Blood Culture (routine x 2)     Status: None (Preliminary result)    Collection Time: 01/12/20  8:22 PM   Specimen: BLOOD  Result Value Ref Range Status   Specimen Description   Final    BLOOD SITE NOT SPECIFIED Performed at [facility], [address] [place], [place] [address]    Special Requests   Final    BOTTLES DRAWN AEROBIC AND ANAEROBIC Blood Culture adequate volume Performed at [facility] [place] [facility], [address] [place], [place] [address]    Culture   Final    NO GROWTH 2  DAYS Performed at [facility], [address] [place], [place] [address]    Report Status PENDING  Incomplete  Blood Culture (routine x 2)     Status: None (Preliminary result)   Collection Time: 01/12/20  8:22 PM   Specimen: BLOOD  Result Value Ref Range Status   Specimen Description   Final    BLOOD SITE NOT SPECIFIED Performed at [facility], [address] [place], [place] [address]    Special Requests   Final    BOTTLES DRAWN AEROBIC AND ANAEROBIC Blood Culture adequate volume Performed at [facility], [address], [place], [place] [address]    Culture   Final    NO GROWTH 2 DAYS Performed at [facility], [address] [place], [place] [address]    Report Status PENDING  Incomplete  Urine culture     Status: Abnormal (Preliminary result)   Collection Time: 01/12/20  8:22 PM   Specimen: In/Out Cath Urine  Result Value Ref Range Status   Specimen Description   Final    IN/OUT CATH URINE Performed at [facility], [address], [place], [place] [address]    Special Requests   Final    NONE Performed at [facility], [address] [place], [place] [address]    Culture (A)  Final    >=100,000 COLONIES/mL PSEUDOMONAS AERUGINOSA >=100,000 COLONIES/mL ESCHERICHIA COLI SUSCEPTIBILITIES TO FOLLOW Performed at [facility], [address], [place], [place] [address]    Report Status PENDING  Incomplete  SARS Coronavirus  2 by RT PCR (hospital order, performed in [facility] hospital lab) Nasopharyngeal Nasopharyngeal Swab     Status: None   Collection Time: 01/12/20  8:22 PM   Specimen: Nasopharyngeal Swab  Result Value Ref Range Status   SARS Coronavirus 2 NEGATIVE NEGATIVE Final    Comment: (NOTE) SARS-CoV-2 target nucleic acids are NOT DETECTED.  The SARS-CoV-2 RNA is generally detectable in upper and lower respiratory specimens during the acute phase of infection. The lowest concentration of SARS-CoV-2 viral copies this assay can detect is 250 copies / mL. A negative result does not preclude SARS-CoV-2 infection and should not be used as the sole basis for treatment or other patient management decisions.  A negative result may occur with improper specimen collection / handling, submission of specimen other than nasopharyngeal swab, presence of viral mutation(s) within the areas targeted by this assay, and inadequate number of viral copies (<250 copies / mL). A negative result must be combined with clinical observations, patient history, and epidemiological information.  Fact Sheet for Patients:   BoilerBrush.com.cy  Fact Sheet for Healthcare Providers: https://pope.com/  This test is not yet approved or  cleared by the [place] FDA and has been authorized for detection and/or diagnosis of SARS-CoV-2 by FDA under an Emergency Use Authorization (EUA).  This EUA will remain in effect (meaning this test can be used) for the duration of the COVID-19 declaration under Section 564(b)(1) of the Act, 21 U.S.C. section 360bbb-3(b)(1), unless the authorization is terminated or revoked sooner.  Performed at [facility], [address], [place], [place] [address]      Radiology Studies: DG Chest 1 View  Result Date: 01/12/2020 CLINICAL DATA:  Urinary tract infection.  Sepsis. EXAM: CHEST  1 VIEW COMPARISON:  10/28/2013 and  overlapping portions of CT abdomen from 12/16/2019 FINDINGS: Stable linear subsegmental atelectasis or scarring in the lower  lobes. Atherosclerotic calcification of the aortic arch. Prior CABG. No edema. IMPRESSION: 1. Stable linear subsegmental atelectasis or scarring in the lower lobes. 2. Prior CABG. 3. Atherosclerosis. Electronically Signed   By: Gaylyn Rong M.D.   On: 01/12/2020 20:44   DG Chest 1 View  Final Result       Scheduled Meds: . allopurinol  100 mg Oral Daily  . aspirin  81 mg Oral Daily  . buPROPion  300 mg Oral Daily  . Chlorhexidine Gluconate Cloth  6 each Topical Daily  . citalopram  20 mg Oral Daily  . enoxaparin (LOVENOX) injection  40 mg Subcutaneous Q24H  . nabumetone  750 mg Oral BID  . ondansetron (ZOFRAN) IV  4 mg Intravenous Once  . pantoprazole  40 mg Oral Daily  . riluzole  50 mg Oral Q12H  . senna-docusate  1 tablet Oral Daily  . simvastatin  20 mg Oral QHS  . tamsulosin  0.4 mg Oral Daily   PRN Meds: acetaminophen **OR** acetaminophen, ALPRAZolam, clonazePAM, fluticasone, HYDROcodone-acetaminophen, ibuprofen, ondansetron **OR** ondansetron (ZOFRAN) IV Continuous Infusions: . ceFEPime (MAXIPIME) IV 2 g (01/14/20 0855)  . lactated ringers 100 mL/hr at 01/14/20 1424      LOS: 2 days  Time spent: Greater than 50% of the 35 minute visit was spent in counseling/coordination of care for the patient as laid out in the A&P.   Lewie Chamber, MD [facility] 01/14/2020, 2:55 PM   Contact via secure chat.  To contact the attending provider between 7A-7P or the covering provider during after hours 7P-7A, please log into the web site [email or web address] and access using universal [place] password for that web site. If you do not have the password, please call the hospital operator.

## 2020-01-14 NOTE — TOC Initial Note (Signed)
Transition of Care [facility]) - Initial/Assessment Note    Patient Details  Name: Jason Tate MRN: [id number] Date of Birth: [birth year]  Transition of Care [facility]) CM/SW Contact:    Elliot Cousin, RN Phone Number: [phone] 01/14/2020, 1:42 PM  Clinical Narrative:                 TOC CM spoke to pt's wife, Pattricia Boss. Pt is confused.   Expected Discharge Plan: Home w Hospice Care Barriers to Discharge: Continued Medical Work up   Patient Goals and CMS Choice Patient states their goals for this hospitalization and ongoing recovery are:: prefers he goes home with Hospice. Wife states [facility] have been working with patient. Offered choice and wife agreeable to [employer]. TOC CM contacted [facility] rep, Kathlene November to make aware pt will dc home with Hospice. Contacted [facility] rep, Vangie Bicker with new referral. Pt has hospital bed, wheelchair, and hoyer lift at home. She also has 24 hour caregivers that come everyday with agency through [facility]. Attending updated.     CMS Medicare.gov Compare Post Acute Care list provided to:: Patient Represenative (must comment) Nolon Bussing) Choice offered to / list presented to : Spouse  Expected Discharge Plan and Services Expected Discharge Plan: Home w Hospice Care In-house Referral: Clinical Social Work Discharge Planning Services: CM Consult Post Acute Care Choice: Hospice Living arrangements for the past 2 months: Single Family Home                           HH Arranged: RN [facility] Agency: [facility] [place] Date [facility] Agency Contacted: 01/14/20 Time HH Agency Contacted: 1340 Representative spoke with at [facility] Agency: Elsie Saas  Prior Living Arrangements/Services Living arrangements for the past 2 months: Single Family Home Lives with:: Spouse Patient language and need for interpreter reviewed:: Yes Do you feel safe going back to the place where you live?: Yes       Need for Family Participation in Patient Care: Yes (Comment) Care giver support system in place?: Yes (comment) Current home services: DME (hospital bed, electric hoyer lift, wheelchair, 24 hour caregiver) Criminal Activity/Legal Involvement Pertinent to Current Situation/Hospitalization: No - Comment as needed  Activities of Daily Living Home Assistive Devices/Equipment: Other (Comment) (pt unable to answer) ADL Screening (condition at time of admission) Patient's cognitive ability adequate to safely complete daily activities?: No Is the patient deaf or have difficulty hearing?: No Does the patient have difficulty seeing, even when wearing glasses/contacts?: Yes (hx macular degeneration per chart) Does the patient have difficulty concentrating, remembering, or making decisions?: Yes Patient able to express need for assistance with ADLs?: Yes Does the patient have difficulty dressing or bathing?: Yes Independently performs ADLs?: No Communication: Independent Dressing (OT): Independent Grooming: Needs assistance Is this a change from baseline?: Pre-admission baseline Feeding: Independent Bathing: Needs assistance Is this a change from baseline?: Pre-admission baseline Toileting: Needs assistance Is this a change from baseline?: Pre-admission baseline In/Out Bed: Needs assistance Is this a change from baseline?: Pre-admission baseline Walks in Home: Needs assistance Is this a change from baseline?: Pre-admission baseline Does the patient have difficulty walking or climbing stairs?: Yes Weakness of Legs: Both Weakness of Arms/Hands: Both  Permission Sought/Granted Permission sought to share information with : Case Manager, Magazine features editor, PCP, Family Supports Permission granted to share information with : Yes, Verbal Permission Granted  Share Information with NAME: Leopold Smyers  Permission granted to share info w AGENCY: Hospice  Permission granted to share info  w Relationship: wife  Permission granted to share info w Contact Information: [phone]  Emotional Assessment           Psych Involvement: No (comment)  Admission diagnosis:  Sepsis secondary to UTI (HCC) [A41.9, N39.0] Patient Active Problem List   Diagnosis Date Noted  . Sepsis secondary to UTI (HCC) 01/12/2020  . Acute metabolic encephalopathy 01/12/2020  . 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 Gait abnormality 12/25/2018  . Macular degeneration 09/22/2018  . Depression 04/18/2015  . OSA (obstructive sleep apnea) 04/18/2015  . Leg edema, left 11/02/2013  . 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 CAD (coronary artery disease) 04/09/2011  . Gout 04/09/2011  . Fatigue 03/05/2011  . Abnormal ECG 03/05/2011  . Hypercholesterolemia   . PVC (premature ventricular contraction)    PCP:  [name]., MD Pharmacy:   [facility] [place], [place] - [address] Portal to Registered [employer] Sites [address] [place] [address] Phone: [phone] Fax: [phone]  [id number] [place] [address] [place] [address] Phone: [phone] Fax: [phone]     Social Determinants of Health (SDOH) Interventions    Readmission Risk Interventions No flowsheet data found.

## 2020-01-14 NOTE — Evaluation (Signed)
SLP Cancellation Note  Patient Details Name: Jason Tate MRN: [id number] DOB: 1945/11/09   Cancelled treatment:       Reason Eval/Treat Not Completed: Other (comment);SLP screened, no needs identified, will sign off (pt declines issues with swallowing, stating he is very careful and politely declined evaluation.  SLP did advise pt to consider voice banking to help with communication as his ALS progresses.)  Rolena Infante, MS [facility] SLP Acute Rehab Services Office [phone]  Chales Abrahams 01/14/2020, 6:38 PM

## 2020-01-15 DIAGNOSIS — R338 Other retention of urine: Secondary | ICD-10-CM

## 2020-01-15 LAB — CBC WITH DIFFERENTIAL/PLATELET
Abs Immature Granulocytes: 0.27 10*3/uL — ABNORMAL HIGH (ref 0.00–0.07)
Basophils Absolute: 0.1 10*3/uL (ref 0.0–0.1)
Basophils Relative: 1 %
Eosinophils Absolute: 0.1 10*3/uL (ref 0.0–0.5)
Eosinophils Relative: 1 %
HCT: 26.4 % — ABNORMAL LOW (ref 39.0–52.0)
Hemoglobin: 8.5 g/dL — ABNORMAL LOW (ref 13.0–17.0)
Immature Granulocytes: 2 %
Lymphocytes Relative: 6 %
Lymphs Abs: 0.7 10*3/uL (ref 0.7–4.0)
MCH: 31.3 pg (ref 26.0–34.0)
MCHC: 32.2 g/dL (ref 30.0–36.0)
MCV: 97.1 fL (ref 80.0–100.0)
Monocytes Absolute: 0.9 10*3/uL (ref 0.1–1.0)
Monocytes Relative: 8 %
Neutro Abs: 9.3 10*3/uL — ABNORMAL HIGH (ref 1.7–7.7)
Neutrophils Relative %: 82 %
Platelets: 222 10*3/uL (ref 150–400)
RBC: 2.72 MIL/uL — ABNORMAL LOW (ref 4.22–5.81)
RDW: 14.9 % (ref 11.5–15.5)
WBC: 11.4 10*3/uL — ABNORMAL HIGH (ref 4.0–10.5)
nRBC: 0 % (ref 0.0–0.2)

## 2020-01-15 LAB — URINE CULTURE: Culture: >=100000 — AB

## 2020-01-15 LAB — BASIC METABOLIC PANEL
Anion gap: 10 (ref 5–15)
BUN: 8 mg/dL (ref 8–23)
CO2: 22 mmol/L (ref 22–32)
Calcium: 8.8 mg/dL — ABNORMAL LOW (ref 8.9–10.3)
Chloride: 101 mmol/L (ref 98–111)
Creatinine, Ser: 0.47 mg/dL — ABNORMAL LOW (ref 0.61–1.24)
GFR calc Af Amer: >60 mL/min (ref >60)
GFR calc non Af Amer: >60 mL/min (ref >60)
Glucose, Bld: 105 mg/dL — ABNORMAL HIGH (ref 70–99)
Potassium: 3.5 mmol/L (ref 3.5–5.1)
Sodium: 133 mmol/L — ABNORMAL LOW (ref 135–145)

## 2020-01-15 LAB — MAGNESIUM: Magnesium: 1.7 mg/dL (ref 1.7–2.4)

## 2020-01-15 MED ORDER — CIPROFLOXACIN HCL 500 MG PO TABS: 500.0000 mg | ORAL_TABLET | Freq: Two times a day (BID) | ORAL | 0 refills | Status: AC

## 2020-01-15 MED ORDER — CIPROFLOXACIN HCL 500 MG PO TABS
500.0000 mg | ORAL_TABLET | Freq: Two times a day (BID) | ORAL | Status: DC
  Administered 2020-01-15: 500 mg via ORAL
  Filled 2020-01-15: qty 1

## 2020-01-15 NOTE — Progress Notes (Signed)
Pt to be discharged to home this afternoon. Wife Pattricia Boss at the bedside and discharge teaching including Medications and schedules for these reviewed with Pt's Wife. Understanding verbalized by pt's Wife of all discharge instructions. Discharge packet with Pt's Wife at time of discharge

## 2020-01-15 NOTE — Progress Notes (Signed)
Initial Nutrition Assessment  INTERVENTION:   -Ensure Enlive po BID, each supplement provides 350 kcal and 20 grams of protein  -Discharge summary in place prior to placing order for Ensure. Recommend pt continue to drink supplements at home.  NUTRITION DIAGNOSIS:   Increased nutrient needs related to acute illness as evidenced by estimated needs.  GOAL:   Patient will meet greater than or equal to 90% of their needs  MONITOR:   PO intake, Supplement acceptance, Labs, Weight trends, I & O's  REASON FOR ASSESSMENT:   Malnutrition Screening Tool    ASSESSMENT:   74 y.o. male with medical history significant of ALS, chronic indwelling foley.  Pt presents to ED with fever and generalized weakness.  No cough nor abd pain, no CP, no SOB.  Patient in room with no visitors at bedside. Pt states he ate a little bit of everything on his breakfast tray. States his appetite has improved despite not really caring for hospital food. Pt states he drinks Ensure supplements at home.   Per chart review, discharge summary placed so pt will d/c today.   Per weight records, pt has lost 7 lbs since February 2021, this is insignificant for time frame.  Depletions noted below may be related to bedbound status from ALS.  Medications: Senokot, Lactated Ringers infusion  Labs reviewed: Low Na  NUTRITION - FOCUSED PHYSICAL EXAM:    Most Recent Value  Orbital Region Moderate depletion  Upper Arm Region Mild depletion  Thoracic and Lumbar Region Unable to assess  Buccal Region Moderate depletion  Temple Region Moderate depletion  Clavicle Bone Region Moderate depletion  Clavicle and Acromion Bone Region Moderate depletion  Scapular Bone Region Unable to assess  Dorsal Hand Unable to assess  Patellar Region Unable to assess  Anterior Thigh Region Unable to assess  Posterior Calf Region Unable to assess  Edema (RD Assessment) Mild  Hair Reviewed  Eyes Reviewed  Mouth Reviewed  Skin  Reviewed       Diet Order:   Diet Order            Diet Heart Room service appropriate? Yes; Fluid consistency: Thin  Diet effective now                 EDUCATION NEEDS:   No education needs have been identified at this time  Skin:  Skin Assessment: DTI on mid coccyx  Last BM:  8/18  Height:   Ht Readings from Last 1 Encounters:  01/14/20 6\' 1"  (1.854 m)    Weight:   Wt Readings from Last 1 Encounters:  01/14/20 81.6 kg   BMI:  Body mass index is 23.73 kg/m.  Estimated Nutritional Needs:   Kcal:  1800-2000  Protein:  80-90g  Fluid:  1.8L/day  01/16/20, MS, RD, LDN Inpatient Clinical Dietitian Contact information available via Amion

## 2020-01-15 NOTE — Discharge Summary (Signed)
Physician Discharge Summary  Jason Tate UXL:[id number] DOB: Aug 26, 1945 DOA: 01/12/2020  PCP: [name]., MD  Admit date: 01/12/2020 Discharge date: 01/15/2020  Admitted From: home Disposition:  home Discharging physician: Lewie Chamber, MD  Recommendations for Outpatient Follow-up:  1. Follow up with urology  Patient discharged to home in Discharge Condition: stable CODE STATUS: Full Diet recommendation:  Diet Orders (From admission, onward)    Start     Ordered   01/12/20 2242  Diet Heart Room service appropriate? Yes; Fluid consistency: Thin  Diet effective now       Question Answer Comment  Room service appropriate? Yes   Fluid consistency: Thin      01/12/20 2241          Hospital Course: [name] is a 74 yo CM with PMH ALS (bedbound now), urinary retention (recently indwelling foley placed 12/16/19), CAD, HLD, gradual functional decline who presented to the ER with a fever and weakness at home.  He was subsequently found to have sepsis due to a UTI.  He was started on cefepime on admission.  After further collateral information from his wife, it appears that the tubing of his Foley catheter has been elevated above the level of his body at home before draining into the bag.  It was considered that this contributed to his development of his urinary infection.  They do have palliative care involved and his wife states that she also plans to bring hospice on especially given his significant physical decline over the past several months.  The morning following admission he had improved with ongoing IV antibiotics and fluids.  Urine culture had speciated to Pseudomonas and E. coli.  He was continued on cefepime while awaiting further sensitivities.  As of cultures matured further, they were noted to be pansensitive.  He was deescalated to ciprofloxacin to complete a total of a 7-day course of antibiotics (5 more days of Cipro on discharge) for treatment of complicated  UTI.  His Foley catheter was exchanged on admission on 01/12/2020.   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  Cared for by his wife.  Palliative care has been involved.  She also plans to bring on hospice soon after discharge -Continue riluzole  Acute metabolic encephalopathy -Considered due to UTI.  Mentation has significantly improved with treatment of infection and now back to normal baseline  Sepsis secondary to UTI [facility]) -Patient had indwelling Foley catheter placed on 12/16/2019 after presenting to the ER with severe urinary retention and constipation.  There appears to be a component of improper drainage of the urine at home with catheter tubing above level of penis with what sounds to be significant retrograde flow per his wife and her description. -Foley catheter was exchanged on 01/12/2020 in the ER -Follow-up urine and blood cultures (remain NGTD x 2 days): UCx growing Pseudomonas and E. Coli.  Pansensitive -Patient descalated to Cipro to complete 5 more days for total of 7-day course at discharge  Acute urinary retention -Outpatient follow-up with urology.  Foley was exchanged on 01/12/2020 -Patient also discharged with hospice.  Wife also given further Foley education during hospitalization    The patient's chronic medical conditions were treated accordingly per the patient's home medication regimen except as noted.  On day of discharge, patient was felt deemed stable for discharge. Patient/family member advised to call PCP or come back to ER if needed.   Discharge Diagnoses:  Principal Diagnosis: Sepsis secondary to UTI [facility])  Active Hospital Problems   Diagnosis Date Noted  . Sepsis secondary to UTI (HCC) 01/12/2020    Priority: High  . Acute metabolic encephalopathy 01/12/2020    Priority: High  . Acute urinary retention 01/15/2020  . ALS (amyotrophic lateral sclerosis) (HCC)  01/19/2019  . CAD (coronary artery disease) 04/09/2011    Resolved Hospital Problems  No resolved problems to display.    Discharge Instructions    Discharge wound care:   Complete by: As directed    Continue constant turning/rotating in bed to promote wound healing   Increase activity slowly   Complete by: As directed      Allergies as of 01/15/2020      Reactions   Penicillins Nausea Only      Medication List    STOP taking these medications   clonazePAM 1 MG tablet Commonly known as: KLONOPIN     TAKE these medications   allopurinol 100 MG tablet Commonly known as: ZYLOPRIM Take 1 tablet (100 mg total) by mouth daily.   ALPRAZolam 1 MG tablet Commonly known as: XANAX Take 1 tablet (1 mg total) by mouth 3 (three) times daily as needed for anxiety.   aspirin 81 MG tablet Take 81 mg by mouth daily.   buPROPion 300 MG 24 hr tablet Commonly known as: WELLBUTRIN XL Take 1 tablet (300 mg total) by mouth daily.   ciprofloxacin 500 MG tablet Commonly known as: CIPRO Take 1 tablet (500 mg total) by mouth 2 (two) times daily for 5 days.   citalopram 20 MG tablet Commonly known as: CELEXA Take 1 tablet (20 mg total) by mouth daily.   Copper 5 MG Tabs Take 5 mg by mouth daily.   fluticasone 50 MCG/ACT nasal spray Commonly known as: FLONASE Place 1 spray into both nostrils daily as needed for allergies.   HYDROcodone-acetaminophen 5-325 MG tablet Commonly known as: NORCO/VICODIN Take 1 tablet by mouth every 6 (six) hours as needed for moderate pain.   ibuprofen 200 MG tablet Commonly known as: ADVIL Take 400 mg by mouth at bedtime as needed.   nabumetone 750 MG tablet Commonly known as: RELAFEN Take 1 tablet (750 mg total) by mouth daily. What changed: when to take this   NON FORMULARY TUDCA 1 gm 2 per day   omeprazole 20 MG capsule Commonly known as: PRILOSEC Take 1 capsule (20 mg total) by mouth daily.   riluzole 50 MG tablet Commonly known as:  RILUTEK Take 50 mg by mouth every 12 (twelve) hours.   senna-docusate 8.6-50 MG tablet Commonly known as: Senokot-S Take 1 tablet by mouth daily.   simvastatin 20 MG tablet Commonly known as: Zocor Take 1 tablet (20 mg total) by mouth at bedtime.   tamsulosin 0.4 MG Caps capsule Commonly known as: FLOMAX Take 0.4 mg by mouth daily.   ZYRTEC ALLERGY PO Take 1 tablet by mouth daily as needed (allergies).            Discharge Care Instructions  (From admission, onward)         Start     Ordered   01/15/20 0000  Discharge wound care:       Comments: Continue constant turning/rotating in bed to promote wound healing   01/15/20 1232          Follow-up Information     [facility] Follow [facility].   Specialty: Hospice and Palliative Medicine Why: Also [facility]. Hospice RN will  call to arrange initial visit Contact information: [address] [place] [address] [phone]              Allergies  Allergen Reactions  . Penicillins Nausea Only    Discharge Exam: BP 121/73 (BP Location: Right Arm)   Pulse (!) 103   Temp 97.8 F (36.6 C) (Oral)   Resp 18   Ht 6\' 1"  (1.854 m)   Wt 81.6 kg   SpO2 97%   BMI 23.73 kg/m  General appearance: alert, cooperative and no distress Head: Normocephalic, without obvious abnormality, atraumatic Eyes: EOMI Lungs: clear to auscultation bilaterally Heart: regular rate and rhythm and S1, S2 normal Abdomen: normal findings: bowel sounds normal and soft, non-tender Extremities: 1+ lower extremity pitting edema Skin: mobility and turgor normal Neurologic: Significant weakness appreciated in all extremities (1/5) except for left upper extremity which is approximately 4/5 with motor strength.  The results of significant diagnostics from this hospitalization (including imaging, microbiology, ancillary and laboratory) are listed below for reference.   Microbiology: Recent Results (from the past 240  hour(s))  Blood Culture (routine x 2)     Status: None (Preliminary result)   Collection Time: 01/12/20  8:22 PM   Specimen: BLOOD  Result Value Ref Range Status   Specimen Description   Final    BLOOD SITE NOT SPECIFIED Performed at [facility], [address] [place], [place]    Special Requests   Final    BOTTLES DRAWN AEROBIC AND ANAEROBIC Blood Culture adequate volume Performed at [facility], [address] [place], [place]    Culture   Final    NO GROWTH 3 DAYS Performed at [facility], [address] [place], [place]    Report Status PENDING  Incomplete  Blood Culture (routine x 2)     Status: None (Preliminary result)   Collection Time: 01/12/20  8:22 PM   Specimen: BLOOD  Result Value Ref Range Status   Specimen Description   Final    BLOOD SITE NOT SPECIFIED Performed at [facility], [address] [place], [place]    Special Requests   Final    BOTTLES DRAWN AEROBIC AND ANAEROBIC Blood Culture adequate volume Performed at [facility], [address] [place], [place]    Culture   Final    NO GROWTH 3 DAYS Performed at [facility], [address] [place], [place]    Report Status PENDING  Incomplete  Urine culture     Status: Abnormal   Collection Time: 01/12/20  8:22 PM   Specimen: In/Out Cath Urine  Result Value Ref Range Status   Specimen Description   Final    IN/OUT CATH URINE Performed at [facility], [address], [place], [place]    Special Requests   Final    NONE Performed at [facility], [address] [place], [place]    Culture (A)  Final    >=100,000 COLONIES/mL PSEUDOMONAS AERUGINOSA >=100,000 COLONIES/mL ESCHERICHIA COLI    Report Status 01/15/2020 FINAL  Final   Organism ID, Bacteria PSEUDOMONAS AERUGINOSA (A)  Final    Organism ID, Bacteria ESCHERICHIA COLI (A)  Final      Susceptibility   Escherichia coli - MIC*    AMPICILLIN 4 SENSITIVE Sensitive     CEFAZOLIN <=4 SENSITIVE Sensitive     CEFTRIAXONE <=  0.25 SENSITIVE Sensitive     CIPROFLOXACIN <=0.25 SENSITIVE Sensitive     GENTAMICIN <=1 SENSITIVE Sensitive     IMIPENEM <=0.25 SENSITIVE Sensitive     NITROFURANTOIN <=16 SENSITIVE Sensitive     TRIMETH/SULFA <=20 SENSITIVE Sensitive     AMPICILLIN/SULBACTAM 4 SENSITIVE Sensitive     PIP/TAZO <=4 SENSITIVE Sensitive     * >=100,000 COLONIES/mL ESCHERICHIA COLI   Pseudomonas aeruginosa - MIC*    CEFTAZIDIME 4 SENSITIVE Sensitive     CIPROFLOXACIN <=0.25 SENSITIVE Sensitive     GENTAMICIN <=1 SENSITIVE Sensitive     IMIPENEM 2 SENSITIVE Sensitive     PIP/TAZO 8 SENSITIVE Sensitive     CEFEPIME 2 SENSITIVE Sensitive     * >=100,000 COLONIES/mL PSEUDOMONAS AERUGINOSA  SARS Coronavirus 2 by RT PCR (hospital order, performed in [facility] hospital lab) Nasopharyngeal Nasopharyngeal Swab     Status: None   Collection Time: 01/12/20  8:22 PM   Specimen: Nasopharyngeal Swab  Result Value Ref Range Status   SARS Coronavirus 2 NEGATIVE NEGATIVE Final    Comment: (NOTE) SARS-CoV-2 target nucleic acids are NOT DETECTED.  The SARS-CoV-2 RNA is generally detectable in upper and lower respiratory specimens during the acute phase of infection. The lowest concentration of SARS-CoV-2 viral copies this assay can detect is 250 copies / mL. A negative result does not preclude SARS-CoV-2 infection and should not be used as the sole basis for treatment or other patient management decisions.  A negative result may occur with improper specimen collection / handling, submission of specimen other than nasopharyngeal swab, presence of viral mutation(s) within the areas targeted by this assay, and inadequate number of viral copies (<250 copies / mL). A negative result must be combined with clinical observations,  patient history, and epidemiological information.  Fact Sheet for Patients:   BoilerBrush.com.cy  Fact Sheet for Healthcare Providers: https://pope.com/  This test is not yet approved or  cleared by the [place] FDA and has been authorized for detection and/or diagnosis of SARS-CoV-2 by FDA under an Emergency Use Authorization (EUA).  This EUA will remain in effect (meaning this test can be used) for the duration of the COVID-19 declaration under Section 564(b)(1) of the Act, 21 U.S.C. section 360bbb-3(b)(1), unless the authorization is terminated or revoked sooner.  Performed at [facility], [address], [place], [place] [address]      Labs: BNP (last 3 results) No results for input(s): BNP in the last 8760 hours. Basic Metabolic Panel: Recent Labs  Lab 01/12/20 2021 01/13/20 0358 01/14/20 0436 01/15/20 0402  NA 129* 134* 134* 133*  K 4.0 3.7 3.3* 3.5  CL 95* 98 101 101  CO2 GLUCOSE 102* 102* 92 105*  BUN CREATININE 0.60* 0.53* 0.56* 0.47*  CALCIUM 9.0 8.8* 8.7* 8.8*  MG  --   --  1.8 1.7   Liver Function Tests: Recent Labs  Lab 01/12/20 2021 01/13/20 0358  AST 15 14*  ALT 19 16  ALKPHOS 92 81  BILITOT 1.2 0.8  PROT 7.1 6.1*  ALBUMIN 3.3* 2.9*   Recent Labs  Lab 01/12/20 2022  LIPASE 21   No results for input(s): AMMONIA in the last 168 hours. CBC: Recent Labs  Lab 01/12/20 2021 01/13/20 0358 01/14/20 0436 01/15/20 0402  WBC 18.9* 17.5* 13.9* 11.4*  NEUTROABS 16.8*  --  11.7* 9.3*  HGB 9.5* 8.2* 8.0* 8.5*  HCT 29.3* 25.8* 24.5* 26.4*  MCV 96.1 97.0 97.2 97.1  PLT 298 231 204 222   Cardiac Enzymes: No results for input(s): CKTOTAL, CKMB, CKMBINDEX, TROPONINI in the last 168 hours. BNP: Invalid input(s): POCBNP CBG: No results for input(s): GLUCAP in the last 168 hours. D-Dimer No results for input(s): DDIMER in the last 72 hours. Hgb  A1c No results for input(s): HGBA1C in the last 72 hours. Lipid Profile No results for input(s): CHOL, HDL, LDLCALC, TRIG, CHOLHDL, LDLDIRECT in the last 72 hours. Thyroid function studies No results for input(s): TSH, T4TOTAL, T3FREE, THYROIDAB in the last 72 hours.  Invalid input(s): FREET3 Anemia work up No results for input(s): VITAMINB12, FOLATE, FERRITIN, TIBC, IRON, RETICCTPCT in the last 72 hours. Urinalysis    Component Value Date/Time   COLORURINE YELLOW 01/12/2020 2022   APPEARANCEUR HAZY (A) 01/12/2020 2022   LABSPEC 1.015 01/12/2020 2022   PHURINE 5.0 01/12/2020 2022   GLUCOSEU NEGATIVE 01/12/2020 2022   HGBUR MODERATE (A) 01/12/2020 2022   BILIRUBINUR NEGATIVE 01/12/2020 2022   KETONESUR 5 (A) 01/12/2020 2022   PROTEINUR NEGATIVE 01/12/2020 2022   UROBILINOGEN 0.2 03/22/2011 2140   NITRITE NEGATIVE 01/12/2020 2022   LEUKOCYTESUR LARGE (A) 01/12/2020 2022   Sepsis Labs Invalid input(s): PROCALCITONIN,  WBC,  LACTICIDVEN Microbiology Recent Results (from the past 240 hour(s))  Blood Culture (routine x 2)     Status: None (Preliminary result)   Collection Time: 01/12/20  8:22 PM   Specimen: BLOOD  Result Value Ref Range Status   Specimen Description   Final    BLOOD SITE NOT SPECIFIED Performed at [facility], [address] [place], [place] [address]    Special Requests   Final    BOTTLES DRAWN AEROBIC AND ANAEROBIC Blood Culture adequate volume Performed at [facility], [address] [place], [place] [address]    Culture   Final    NO GROWTH 3 DAYS Performed at [facility], [address] [place], [place] [address]    Report Status PENDING  Incomplete  Blood Culture (routine x 2)     Status: None (Preliminary result)   Collection Time: 01/12/20  8:22 PM   Specimen: BLOOD  Result Value Ref Range Status   Specimen Description   Final    BLOOD SITE NOT SPECIFIED Performed at [facility], [address] [place], [place] [address]    Special Requests   Final    BOTTLES DRAWN AEROBIC AND ANAEROBIC Blood Culture adequate volume Performed at [facility], [address] [place], [place] [address]    Culture   Final    NO GROWTH 3 DAYS Performed at [facility], [address] [place], [place] [address]    Report Status PENDING  Incomplete  Urine culture     Status: Abnormal   Collection Time: 01/12/20  8:22 PM   Specimen: In/Out Cath Urine  Result Value Ref Range Status   Specimen Description   Final    IN/OUT CATH URINE Performed at [facility], [address] [place], [place] [address]    Special Requests   Final    NONE Performed at [facility], [address] [place], [place] [address]    Culture (A)  Final    >=100,000 COLONIES/mL PSEUDOMONAS AERUGINOSA >=100,000 COLONIES/mL ESCHERICHIA COLI    Report Status 01/15/2020 FINAL  Final   Organism ID, Bacteria PSEUDOMONAS AERUGINOSA (A)  Final   Organism ID,  Bacteria ESCHERICHIA COLI (A)  Final      Susceptibility   Escherichia coli - MIC*    AMPICILLIN 4 SENSITIVE Sensitive     CEFAZOLIN <=4 SENSITIVE Sensitive     CEFTRIAXONE <=0.25 SENSITIVE Sensitive     CIPROFLOXACIN <=0.25 SENSITIVE Sensitive     GENTAMICIN <=1 SENSITIVE Sensitive     IMIPENEM <=0.25 SENSITIVE Sensitive     NITROFURANTOIN <=16 SENSITIVE Sensitive     TRIMETH/SULFA <=20 SENSITIVE Sensitive     AMPICILLIN/SULBACTAM 4 SENSITIVE Sensitive     PIP/TAZO <=4 SENSITIVE Sensitive     * >=100,000 COLONIES/mL ESCHERICHIA COLI   Pseudomonas aeruginosa - MIC*    CEFTAZIDIME 4 SENSITIVE Sensitive     CIPROFLOXACIN <=0.25 SENSITIVE Sensitive     GENTAMICIN <=1 SENSITIVE Sensitive     IMIPENEM 2 SENSITIVE Sensitive     PIP/TAZO 8 SENSITIVE Sensitive     CEFEPIME 2 SENSITIVE Sensitive     * >=100,000 COLONIES/mL PSEUDOMONAS AERUGINOSA  SARS Coronavirus 2 by RT PCR  (hospital order, performed in [facility] hospital lab) Nasopharyngeal Nasopharyngeal Swab     Status: None   Collection Time: 01/12/20  8:22 PM   Specimen: Nasopharyngeal Swab  Result Value Ref Range Status   SARS Coronavirus 2 NEGATIVE NEGATIVE Final    Comment: (NOTE) SARS-CoV-2 target nucleic acids are NOT DETECTED.  The SARS-CoV-2 RNA is generally detectable in upper and lower respiratory specimens during the acute phase of infection. The lowest concentration of SARS-CoV-2 viral copies this assay can detect is 250 copies / mL. A negative result does not preclude SARS-CoV-2 infection and should not be used as the sole basis for treatment or other patient management decisions.  A negative result may occur with improper specimen collection / handling, submission of specimen other than nasopharyngeal swab, presence of viral mutation(s) within the areas targeted by this assay, and inadequate number of viral copies (<250 copies / mL). A negative result must be combined with clinical observations, patient history, and epidemiological information.  Fact Sheet for Patients:   BoilerBrush.com.cy  Fact Sheet for Healthcare Providers: https://pope.com/  This test is not yet approved or  cleared by the [place] FDA and has been authorized for detection and/or diagnosis of SARS-CoV-2 by FDA under an Emergency Use Authorization (EUA).  This EUA will remain in effect (meaning this test can be used) for the duration of the COVID-19 declaration under Section 564(b)(1) of the Act, 21 U.S.C. section 360bbb-3(b)(1), unless the authorization is terminated or revoked sooner.  Performed at [facility], [address] [place], [place] [address]     Procedures/Studies: DG Chest 1 View  Result Date: 01/12/2020 CLINICAL DATA:  Urinary tract infection.  Sepsis. EXAM: CHEST  1 VIEW COMPARISON:  10/28/2013 and overlapping  portions of CT abdomen from 12/16/2019 FINDINGS: Stable linear subsegmental atelectasis or scarring in the lower lobes. Atherosclerotic calcification of the aortic arch. Prior CABG. No edema. IMPRESSION: 1. Stable linear subsegmental atelectasis or scarring in the lower lobes. 2. Prior CABG. 3. Atherosclerosis. Electronically Signed   By: Gaylyn Rong M.D.   On: 01/12/2020 20:44   CT Head Wo Contrast  Result Date: 12/16/2019 CLINICAL DATA:  Head trauma, loss of consciousness. Additional history provided: Fall, loss of consciousness, abdominal pain EXAM: CT HEAD WITHOUT CONTRAST TECHNIQUE: Contiguous axial images were obtained from the base of the skull through the vertex without intravenous contrast. COMPARISON:  Head CT 10/28/2013 FINDINGS: Brain: Stable, mild generalized parenchymal atrophy. Minimal ill-defined hypoattenuation  within the cerebral white matter is nonspecific, but consistent with chronic small vessel ischemic disease. Findings have progressed as compared to the prior examination of 10/28/2013. There is no acute intracranial hemorrhage. No demarcated cortical infarct. No extra-axial fluid collection. No evidence of intracranial mass. No midline shift. Vascular: No hyperdense vessel.  Atherosclerotic calcifications. Skull: Normal. Negative for fracture or focal lesion. Sinuses/Orbits: Visualized orbits show no acute finding. Partial opacification of a posterior left ethmoid air cell. No significant mastoid effusion. Other: Left frontotemporal scalp hematoma. IMPRESSION: No evidence of acute intracranial abnormality. Left frontotemporal scalp hematoma. Stable, mild generalized parenchymal atrophy. Progressive mild chronic small vessel ischemic disease. Left ethmoid sinus mucosal thickening. Electronically Signed   By: Jackey Loge DO   On: 12/16/2019 15:14   CT ABDOMEN PELVIS W CONTRAST  Result Date: 12/16/2019 CLINICAL DATA:  Fall, nonlocalized abdominal pain EXAM: CT ABDOMEN AND PELVIS  WITH CONTRAST TECHNIQUE: Multidetector CT imaging of the abdomen and pelvis was performed using the standard protocol following bolus administration of intravenous contrast. CONTRAST:  OMNIPAQUE IOHEXOL 300 MG/ML  SOLN COMPARISON:  05/17/2014 FINDINGS: Lower chest: Mild bibasilar atelectasis. Probable coronary artery bypass grafting has been performed. Extensive calcification within the native coronary arteries. Global cardiac size within normal limits. Debris noted within the distal esophagus, nonspecific. Hepatobiliary: Gallbladder unremarkable. Liver unremarkable. No intra or extrahepatic biliary ductal dilation. Pancreas: Unremarkable Spleen: Unremarkable Adrenals/Urinary Tract: The adrenal glands are unremarkable. The kidneys are normal in size and position. There is massive distention of the bladder, with an estimated urine volume of 3 L. No definite intraluminal mass is identified. There is mild bilateral hydronephrosis, likely result of urinary retention. No intrarenal or ureteral calculi are identified. No enhancing renal masses are seen. Probable post TURP changes within the prostate gland. Stomach/Bowel: Stomach, large bowel, and small bowel are unremarkable. The appendix is not visualized and is likely absent. No free intraperitoneal gas or fluid. Vascular/Lymphatic: No pathologic adenopathy within the abdomen and pelvis. There is extensive aortoiliac atherosclerotic calcification identified without evidence of aneurysm. Particularly prominent atherosclerotic calcification is seen involving the celiac and superior mesenteric artery origins as well as the renal artery origins bilaterally. The degree of stenosis is not well characterized on this non arteriographic study. Reproductive: As noted above, funneling of the central prostate gland is noted likely related to prior trans urethral resection. Other: The rectum is unremarkable. There is moderate asymmetric soft tissue infiltration involving the  right lateral abdominal wall likely representing asymmetric subcutaneous edema or hemorrhage in this acutely traumatized patient subcutaneous 6 team a extends into the upper thighs bilaterally, asymmetrically more severe within the right thigh. Musculoskeletal: Several healed right rib fractures are identified. There has been interval development of severe wedge compression fractures of T11, T12, L1, and L3 with a vertebral plana deformity noted at L3 is resolved. There is no paraspinal hematoma or inflammatory stranding identified to suggest that these represent acute fractures. No associated listhesis. No acute bone abnormality identified. IMPRESSION: Massive distention of the bladder, likely related to bladder outlet obstruction, and probable resultant mild bilateral hydronephrosis. The estimated urine volume is greater than 3 L. Asymmetric soft tissue infiltration involving the right lateral abdominal wall and right thigh, possibly posttraumatic sickly T knee is edema or hemorrhage related to recent trauma. Interval development of multiple thoracolumbar compression fractures all likely chronic in nature. Correlation for focal tenderness may be helpful. If present, or in light of an abnormal neurologic examination, MRI examination may be more helpful for further  assessment. Peripheral vascular disease with extensive atherosclerotic calcification at the origin of the renal and mesenteric vasculature. If there is clinical evidence of chronic mesenteric ischemia or significant renal artery stenosis, this could be better assessed with CT arteriography. Aortic Atherosclerosis (ICD10-I70.0). These results will be called to the ordering clinician or representative by the Radiologist Assistant, and communication documented in the PACS or [employer]. Electronically Signed   By: Helyn Numbers MD   On: 12/16/2019 15:21   DG Knee Complete 4 Views Right  Result Date: 12/16/2019 CLINICAL DATA:  Fall EXAM: RIGHT KNEE  - COMPLETE 4+ VIEW COMPARISON:  None FINDINGS: There is no acute fracture or dislocation. A joint effusion is present. Tricompartmental changes of osteoarthritis. Vascular calcifications are noted. IMPRESSION: No acute fracture.  Joint effusion. Electronically Signed   By: Guadlupe Spanish M.D.   On: 12/16/2019 14:38     Time coordinating discharge: Over 30 minutes    Lewie Chamber, MD  [facility] 01/15/2020, 12:35 PM Pager: Secure chat  If 7PM-7AM, please contact night-coverage [email or web address] Password [email or web address]

## 2020-01-15 NOTE — Progress Notes (Signed)
Pt spouse states blue hoyer lift pad lost in ED department. Called to inquire about pad. SRP, RN

## 2020-01-15 NOTE — Progress Notes (Signed)
Pharmacy Antibiotic Note  Jason Tate is a 74 y.o. male admitted on 01/12/2020 with UTI. Pharmacy has been consulted for Cefepime dosing.  Pt had UTI, growing E.coli and Pseudomonas - both pan-sensitive.   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   Plan:  Continue cefepime 2 g IV q8h  Pharmacy to sign off. Please re-consult if needed.  Height: 6\' 1"  (185.4 cm) Weight: 81.6 kg (179 lb 14.3 oz) IBW/kg (Calculated) : 79.9  Temp (24hrs), Avg:97.7 F (36.5 C), Min:97.6 F (36.4 C), Max:97.8 F (36.6 C)  Recent Labs  Lab 01/12/20 2021 01/13/20 0358 01/14/20 0436 01/15/20 0402  WBC 18.9* 17.5* 13.9* 11.4*  CREATININE 0.60* 0.53* 0.56* 0.47*  LATICACIDVEN 0.8 0.9  --   --     Estimated Creatinine Clearance: 90.2 mL/min (A) (by C-G formula based on SCr of 0.47 mg/dL (L)).    Allergies  Allergen Reactions  . Penicillins Nausea Only    Antimicrobials this admission: 8/17 Ceftriaxone x 1 8/17 Cefepime >>    Dose adjustments this admission:  Microbiology results: 8/17 BCx:   8/17 UCx:  >100K E.coli (pan-sensitive), >100K pseudomonas (pan-sensitive) 8/17 Covid: negative    Thank you for allowing pharmacy to be a part of this patient's care.  9/17, PharmD 01/15/2020 10:25 AM

## 2020-01-15 NOTE — Progress Notes (Signed)
[facility] arrive to transport pt to home, VS noted, spouse at bedside. Pt alert and oriented. Prepared for transport. Foley pt. Pt and spouse reminded to check room for personal belongings upon exiting the room. Acknowledged understanding. SRP RN

## 2020-01-15 NOTE — Assessment & Plan Note (Signed)
-  Outpatient follow-up with urology.  Foley was exchanged on 01/12/2020 -Patient also discharged with hospice.  Wife also given further Foley education during hospitalization

## 2020-01-15 NOTE — Progress Notes (Signed)
Administrator, sports received referral for pt to [facility] home under hospice services. Called wife to confirm interest who denied any DME needs. Stated that they have a bed, WC, hoyer and bedside commode.   Pt's chart was reviewed and pt was approved for hospice services.   Liaison will continue to follow pt while pt is admitted so that [facility] admission team can be notified to admit pt upon dc.   Please do not hesitate to outreach with any questions and thank you for the referral.   Trena Platt, RN  [facility] Liaison [phone]

## 2020-01-15 NOTE — Care Management Important Message (Signed)
Important Message  Patient Details IM Letter given to the Patient Name: ALBA KRIESEL MRN: [id number] Date of Birth: Feb 27, 1946   Medicare Important Message Given:  Yes     Caren Macadam 01/15/2020, 10:29 AM

## 2020-01-17 LAB — CULTURE, BLOOD (ROUTINE X 2)
Culture: NO GROWTH
Culture: NO GROWTH
Special Requests: ADEQUATE
Special Requests: ADEQUATE

## 2020-01-18 ENCOUNTER — Telehealth: Payer: Self-pay | Admitting: Neurology

## 2020-01-18 ENCOUNTER — Other Ambulatory Visit: Payer: Self-pay

## 2020-01-18 NOTE — Telephone Encounter (Signed)
Janne Napoleon K called was wondering if Dr. Anne Hahn would be the attendee Doctor for the patient because he is terminally ill. Jason Tate phone number is [phone] she stated that you can leave a detail message because her voicemail is secure.

## 2020-01-18 NOTE — Patient Outreach (Signed)
[facility] Customer service manager [facility]) Care Management  01/18/2020  Jason Tate 07-Sep-1945 [id number]   [name]:  Jason Tate called back and reports patient has follow up with urology n 1 week.  Reports patient is much better. Continues to take medications as prescribed.   PLAN: follow up appointments made at this time.  No other needs identified at this time.  Jason Pavy, RN, BSN, CEN [facility] [employer] [phone]

## 2020-01-18 NOTE — Telephone Encounter (Signed)
Jason Tate from [facility] called again. Stated that Dr. Anne Hahn agreed on 12/04/19 to be the attending physician but the patient declined services at that time.   The patient is seen at our office for ALS. Per Jason Tate, he has been back in the hospital for sepsis and another Hospice referral has been placed. She needs a call back confirming Dr. Anne Hahn is still in agreement to oversee this care so they can get Hospice started quickly.  She is aware Dr. Anne Hahn is out this week. States this is a time sensitive request. She understands that we will need to check with our work-in MD.

## 2020-01-18 NOTE — Patient Outreach (Signed)
[facility]) Care Management  01/18/2020  Jason Tate 01/31/1946 [id number]   [name]: Phone call date:01/17/2020 Reason for alert:  Scheduled follow up---no   Placed call to patient with no answer. Left a message requesting a call back.  PLAN: will mail outreach letter and attempt call back in 3 days.  Rowe Pavy, RN, BSN, CEN [facility] [employer] [phone]

## 2020-01-19 NOTE — Telephone Encounter (Signed)
I have contacted Dr Anne Hahn and he has gave the verbal order and agreed to be the attending will contact [facility].

## 2020-01-19 NOTE — Telephone Encounter (Signed)
[facility] (Krystal)[phone] called needing a attending physician to agree Pt is terminal ill.Would like a phone call. Please leave a voicemail stating agreeing to be attending physician and Pt is terminally ill.

## 2020-01-19 NOTE — Telephone Encounter (Signed)
Called and advised Grover Canavan that Dr Anne Hahn agreed to be attending.

## 2020-01-20 ENCOUNTER — Telehealth: Payer: Self-pay | Admitting: Family Medicine

## 2020-01-20 NOTE — Telephone Encounter (Signed)
Medication Refill - Medication: clonazePAM (KLONOPIN) 1 MG tablet   Has the patient contacted their pharmacy? No. (Agent: If no, request that the patient contact the pharmacy for the refill.) (Agent: If yes, when and what did the pharmacy advise?)  Preferred Pharmacy (with phone number or street name):  [id number] [address] Phone:  [phone]  Fax:  [phone]       Agent: Please be advised that RX refills may take up to 3 business days. We ask that you follow-up with your pharmacy.

## 2020-01-20 NOTE — Telephone Encounter (Signed)
Please review request, medication was last prescribed 01/12/2020 and discontinued on 01/15/20 by Dr. Lyda Perone, discharge reason states that patient was discharged. Please advise if patient should continue medication and if refill is appropriate. KW

## 2020-01-21 ENCOUNTER — Telehealth: Payer: Self-pay | Admitting: Family Medicine

## 2020-01-21 ENCOUNTER — Other Ambulatory Visit: Payer: Self-pay | Admitting: Family Medicine

## 2020-01-21 MED ORDER — CLONAZEPAM 1 MG PO TABS: 1.0000 mg | ORAL_TABLET | Freq: Two times a day (BID) | ORAL | 5 refills | Status: DC | PRN

## 2020-01-21 NOTE — Telephone Encounter (Signed)
Patient is calling again (3rd time) to see if he is to take Clonazepam and to please call in a pres if he is to continue it.   [facility]

## 2020-01-25 DIAGNOSIS — R338 Other retention of urine: Secondary | ICD-10-CM | POA: Diagnosis not present

## 2020-01-26 DIAGNOSIS — R471 Dysarthria and anarthria: Secondary | ICD-10-CM | POA: Diagnosis not present

## 2020-01-26 DIAGNOSIS — Z66 Do not resuscitate: Secondary | ICD-10-CM | POA: Diagnosis not present

## 2020-01-26 DIAGNOSIS — Z5181 Encounter for therapeutic drug level monitoring: Secondary | ICD-10-CM | POA: Diagnosis not present

## 2020-01-26 DIAGNOSIS — M6281 Muscle weakness (generalized): Secondary | ICD-10-CM | POA: Diagnosis not present

## 2020-01-26 DIAGNOSIS — Z789 Other specified health status: Secondary | ICD-10-CM | POA: Diagnosis not present

## 2020-01-26 DIAGNOSIS — G1221 Amyotrophic lateral sclerosis: Secondary | ICD-10-CM | POA: Diagnosis not present

## 2020-01-26 DIAGNOSIS — Z79899 Other long term (current) drug therapy: Secondary | ICD-10-CM | POA: Diagnosis not present

## 2020-02-04 ENCOUNTER — Other Ambulatory Visit: Payer: Self-pay | Admitting: Family Medicine

## 2020-02-04 DIAGNOSIS — N312 Flaccid neuropathic bladder, not elsewhere classified: Secondary | ICD-10-CM | POA: Diagnosis not present

## 2020-02-04 DIAGNOSIS — R338 Other retention of urine: Secondary | ICD-10-CM | POA: Diagnosis not present

## 2020-02-04 DIAGNOSIS — G1221 Amyotrophic lateral sclerosis: Secondary | ICD-10-CM

## 2020-02-04 NOTE — Telephone Encounter (Signed)
   Notes to clinic Rx says one daily, there is a note that says patient is taking one tablet two times a day, please assess.

## 2020-02-08 DIAGNOSIS — M21379 Foot drop, unspecified foot: Secondary | ICD-10-CM | POA: Diagnosis not present

## 2020-02-08 DIAGNOSIS — Z960 Presence of urogenital implants: Secondary | ICD-10-CM | POA: Diagnosis not present

## 2020-02-08 DIAGNOSIS — M170 Bilateral primary osteoarthritis of knee: Secondary | ICD-10-CM | POA: Diagnosis not present

## 2020-02-08 DIAGNOSIS — G1221 Amyotrophic lateral sclerosis: Secondary | ICD-10-CM | POA: Diagnosis not present

## 2020-02-08 DIAGNOSIS — Z87440 Personal history of urinary (tract) infections: Secondary | ICD-10-CM | POA: Diagnosis not present

## 2020-02-09 DIAGNOSIS — Z960 Presence of urogenital implants: Secondary | ICD-10-CM | POA: Diagnosis not present

## 2020-02-09 DIAGNOSIS — M21379 Foot drop, unspecified foot: Secondary | ICD-10-CM | POA: Diagnosis not present

## 2020-02-09 DIAGNOSIS — M170 Bilateral primary osteoarthritis of knee: Secondary | ICD-10-CM | POA: Diagnosis not present

## 2020-02-09 DIAGNOSIS — Z87440 Personal history of urinary (tract) infections: Secondary | ICD-10-CM | POA: Diagnosis not present

## 2020-02-09 DIAGNOSIS — G1221 Amyotrophic lateral sclerosis: Secondary | ICD-10-CM | POA: Diagnosis not present

## 2020-02-11 ENCOUNTER — Telehealth: Payer: Self-pay | Admitting: Family Medicine

## 2020-02-11 NOTE — Telephone Encounter (Signed)
Flour with [facility] calling for verbal PT orders 1 wk 4  cb [phone]

## 2020-02-11 NOTE — Telephone Encounter (Signed)
Verbal okay given for patient.

## 2020-02-11 NOTE — Telephone Encounter (Signed)
ok 

## 2020-02-16 ENCOUNTER — Other Ambulatory Visit: Payer: Self-pay | Admitting: Neurology

## 2020-02-16 MED ORDER — HYDROCODONE-ACETAMINOPHEN 5-325 MG PO TABS
1.0000 | ORAL_TABLET | Freq: Four times a day (QID) | ORAL | 0 refills | Status: DC | PRN
Start: 2020-02-16 — End: 2020-04-12

## 2020-02-16 NOTE — Telephone Encounter (Signed)
Pt called stating he is needing a refill on his HYDROcodone-acetaminophen (NORCO/VICODIN) 5-325 MG [facility] [address]

## 2020-02-17 ENCOUNTER — Telehealth: Payer: Self-pay | Admitting: *Deleted

## 2020-02-17 DIAGNOSIS — Z87440 Personal history of urinary (tract) infections: Secondary | ICD-10-CM | POA: Diagnosis not present

## 2020-02-17 DIAGNOSIS — M170 Bilateral primary osteoarthritis of knee: Secondary | ICD-10-CM | POA: Diagnosis not present

## 2020-02-17 DIAGNOSIS — G1221 Amyotrophic lateral sclerosis: Secondary | ICD-10-CM | POA: Diagnosis not present

## 2020-02-17 DIAGNOSIS — Z960 Presence of urogenital implants: Secondary | ICD-10-CM | POA: Diagnosis not present

## 2020-02-17 DIAGNOSIS — M21379 Foot drop, unspecified foot: Secondary | ICD-10-CM | POA: Diagnosis not present

## 2020-02-17 NOTE — Telephone Encounter (Signed)
Called and left a voicemail to schedule a Palliative care home visit. Left contact information for return call.

## 2020-02-18 ENCOUNTER — Telehealth: Payer: Self-pay | Admitting: Family Medicine

## 2020-02-18 NOTE — Telephone Encounter (Signed)
Yes

## 2020-02-18 NOTE — Telephone Encounter (Signed)
Please advise 

## 2020-02-18 NOTE — Telephone Encounter (Signed)
Jason Tate with hospice called saying the wife would like to switch to hospice care and she wants to know if Dr. Sullivan Lone will be his attending physican.  CB#  [phone]

## 2020-02-19 ENCOUNTER — Telehealth: Payer: Self-pay | Admitting: Neurology

## 2020-02-19 NOTE — Telephone Encounter (Signed)
[facility] Jason Tate) called Pt has switched from Palliative care to Hospice. Wanting to know if Dr. Anne Hahn will follow to be his attending physician. Would like a call back to verify.

## 2020-02-19 NOTE — Telephone Encounter (Signed)
Left detailed message for Augusto Gamble giving okay. Advised to return call with questions.

## 2020-02-22 NOTE — Telephone Encounter (Signed)
I called the nursing agency, okay for me to be the attending physician through hospice.

## 2020-02-22 NOTE — Telephone Encounter (Signed)
Dr Willis- please advise 

## 2020-03-09 DIAGNOSIS — G1221 Amyotrophic lateral sclerosis: Secondary | ICD-10-CM | POA: Diagnosis not present

## 2020-03-10 ENCOUNTER — Telehealth: Payer: Self-pay

## 2020-03-10 NOTE — Telephone Encounter (Signed)
Copied from CRM [id number]. Topic: General - Inquiry >> Mar 10, 2020  2:00 PM Adrian Prince D wrote: Reason for CRM: Hospice nurse called about this patient and stated that the Patient has ALS and it is making some rapid progression. She will be Focusing more on comfort. You can reach out to the hospice nurse if you want more details. Maura the hospice nurse  can be reached at [phone]. Please advise

## 2020-03-18 ENCOUNTER — Telehealth: Payer: Self-pay | Admitting: Family Medicine

## 2020-03-18 NOTE — Telephone Encounter (Signed)
Medication Refill - Medication: Tessalon Pearls, Cough syrup Hydrocodone   Has the patient contacted their pharmacy? Yes.   (Agent: If no, request that the patient contact the pharmacy for the refill.) (Agent: If yes, when and what did the pharmacy advise?)  Preferred Pharmacy (with phone number or street name):  [id number] [place] [address], [place] [facility] [address]  [address] [place] [address]  Phone: [phone] Fax: [phone]  Hours: Not open 24 hours     Agent: Please be advised that RX refills may take up to 3 business days. We ask that you follow-up with your pharmacy.

## 2020-03-24 ENCOUNTER — Telehealth: Payer: Self-pay | Admitting: Neurology

## 2020-03-24 NOTE — Telephone Encounter (Signed)
Physician's order faxed to [facility] at [phone] on 03/24/2020.

## 2020-03-28 ENCOUNTER — Other Ambulatory Visit (HOSPITAL_COMMUNITY)
Admission: RE | Admit: 2020-03-28 | Discharge: 2020-03-28 | Disposition: A | Discharge status: Home / Self Care | Payer: Medicare Other | Source: Other Acute Inpatient Hospital | Attending: *Deleted | Admitting: *Deleted

## 2020-03-28 ENCOUNTER — Telehealth: Payer: Self-pay | Admitting: Family Medicine

## 2020-03-28 DIAGNOSIS — N390 Urinary tract infection, site not specified: Secondary | ICD-10-CM

## 2020-03-28 NOTE — Telephone Encounter (Signed)
She has dropped off a urine specimen at [facility] and requested results to her and Dr. Sullivan Lone.

## 2020-03-29 ENCOUNTER — Telehealth: Payer: Self-pay

## 2020-03-29 NOTE — Telephone Encounter (Signed)
Hospice nurse has ordered this through Hospice MD>  Have advised patient's wife to let them know we need for them to send [place] results.

## 2020-03-29 NOTE — Telephone Encounter (Signed)
Please advise? There is no order in epic for urine dip or culture.

## 2020-03-29 NOTE — Telephone Encounter (Signed)
I assume she has asked hospice to do this.  I would have no idea how to order UA through [facility].

## 2020-03-29 NOTE — Telephone Encounter (Signed)
Copied from CRM [id number]. Topic: General - Other >> Mar 29, 2020 10:59 AM Jaquita Rector A wrote: Reason for CRM: Sharman Crate nurse with [facility] called to inform Dr Sullivan Lone that she sent over results of a urinalysis and awaiting a call back with orders of what they will do next if any medication will be sent to the pharmacy and also ask if nurse can call Mrs Toft and inform her of Dr Wonda Olds orders. Ph# [phone]

## 2020-03-30 MED ORDER — LEVOFLOXACIN 500 MG PO TABS: 500.0000 mg | ORAL_TABLET | Freq: Every day | ORAL | 0 refills | Status: DC

## 2020-03-30 NOTE — Telephone Encounter (Signed)
Copied from CRM #[id number]. Topic: General - Other >> Mar 29, 2020 10:59 AM Davis, Karen A wrote: Reason for CRM: Maura nurse with [facility] called to inform Dr Gilbert that she sent over results of a urinalysis and awaiting a call back with orders of what they will do next if any medication will be sent to the pharmacy and also ask if nurse can call Mrs Carias and inform her of Dr Gilberts orders. Ph# [phone] 

## 2020-03-30 NOTE — Telephone Encounter (Signed)
Fax received. Per Dr. Catarina Hartshorn Levaquin 500 mg qd x5 days to pharmacy. Patient's wife and Jason Tate was advised.

## 2020-03-30 NOTE — Addendum Note (Signed)
Addended by: Marlene Lard on: 03/30/2020 04:11 PM   Modules accepted: Orders

## 2020-03-30 NOTE — Telephone Encounter (Addendum)
Urine results were faxed to the office.

## 2020-03-30 NOTE — Telephone Encounter (Signed)
I am still attending and his ALS is progressing rapidly

## 2020-03-30 NOTE — Telephone Encounter (Signed)
RN Jason Tate with [facility] called stating she wanted to clear up some confusion. Jason Tate states that Jason Tate spoke with patient's family yesterday and advised them that the hospice MD would be the one handeling orders for the patient. Jason Tate states that when patient was accepted as a hospice patient with [facility], Jason Tate signed and agreed to be the attending physician for hospice which would include signing standing orders. Jason Tate states that she collected a urine sample and culture because she suspected a UTI (which there is a standing order for if there is suspicion). Jason Tate wants to be sure Jason Tate still agrees to be the attending physician to sign for standing orders. Jason Tate states a copy of the UA was faxed to our office yesterday. Urine culture results are still pending and the results will also be faxed to our office once they are finalized.   [facility] [address] [place], [place])  Jason Tate also wanted to give Jason Tate an update on patients condition. Jason Tate states that since admission there has been a steady progression with his ALS.   Jason Tate's call back is [phone].

## 2020-03-30 NOTE — Telephone Encounter (Signed)
Urine results were faxed to the office. Also urine was sent for culture. Sharman Crate states she is seeing patient twice a week now.

## 2020-04-04 ENCOUNTER — Telehealth: Payer: Self-pay

## 2020-04-04 ENCOUNTER — Telehealth: Payer: Self-pay | Admitting: Family Medicine

## 2020-04-04 DIAGNOSIS — N390 Urinary tract infection, site not specified: Secondary | ICD-10-CM

## 2020-04-04 MED ORDER — LEVOFLOXACIN 500 MG PO TABS: 500.0000 mg | ORAL_TABLET | Freq: Every day | ORAL | 0 refills | Status: AC

## 2020-04-04 NOTE — Telephone Encounter (Signed)
Try to encourage fluids and repeat Levaquin for 1 more week.

## 2020-04-04 NOTE — Telephone Encounter (Signed)
Roney Mans RN with [facility] is calling to let the office staff know that she faxed some lab results. Please advise Cb- [phone] Sharman Crate is wanting a confirmation The antibiotics appears to the right one. Does Dr. Sullivan Lone want to extend the antibiotic?

## 2020-04-04 NOTE — Telephone Encounter (Signed)
Copied from CRM [id number]. Topic: General - Other >> Apr 04, 2020  1:15 PM Dalphine Handing A wrote: Home Health nurse called to inform that Pts last dose of leviquin was completed and patient had greenish discharge on his briefs today.  Patient may need a change in antibiotic or an extended course of the same antibiotic and requesting a callback as soon as possible.Please advise  Best contact [phone]

## 2020-04-04 NOTE — Telephone Encounter (Signed)
Maura advised.  [facility] [place] [place]  Maura requested for a virtual visit to be set up with the patient and his wife for questions concerning the Flu and Covid Booster, as well as their private home health nurse that is not vaccinated and is not wearing the proper PPE. I left a message for Ms. Amble to call back and scheduel.  PEC please schedule a virtual when she calls back.   Thanks,   -Vernona Rieger

## 2020-04-05 ENCOUNTER — Telehealth: Payer: Self-pay

## 2020-04-05 NOTE — Progress Notes (Signed)
Jason Tate Date of Birth: [birth year] Medical Record #[id number]  History of Present Illness: Jason Tate is seen for follow up of CAD. He is status post CABG in October 2012. He presented at that time with chest pain and a markedly abnormal stress Echo. He has a history of second degree heart block that resolved with stopping beta blocker therapy. He has a histor of OSA,hypercholesterolemia and gout. Myoview study in March 2018 showed evidence of prior infarct without ischemia. EF 48%.   He has been diagnosed with ALS. Had multiple falls with fractured spine. Followed by Neurology here and at [facility]. He is now bed bound and has a foley in place. He was admitted in August with UTI and sepsis.    Current Outpatient Medications on File Prior to Visit  Medication Sig Dispense Refill   allopurinol (ZYLOPRIM) 100 MG tablet Take 1 tablet (100 mg total) by mouth daily. 90 tablet 3   ALPRAZolam (XANAX) 1 MG tablet Take 1 tablet (1 mg total) by mouth 3 (three) times daily as needed for anxiety. 90 tablet 1   aspirin 81 MG tablet Take 81 mg by mouth daily.     buPROPion (WELLBUTRIN XL) 300 MG 24 hr tablet Take 1 tablet (300 mg total) by mouth daily. 90 tablet 3   Cetirizine HCl (ZYRTEC ALLERGY PO) Take 1 tablet by mouth daily as needed (allergies).      citalopram (CELEXA) 20 MG tablet Take 1 tablet (20 mg total) by mouth daily. 90 tablet 3   clonazePAM (KLONOPIN) 1 MG tablet Take 1 tablet (1 mg total) by mouth 2 (two) times daily as needed for anxiety. 60 tablet 5   Copper 5 MG TABS Take 5 mg by mouth daily.     fluticasone (FLONASE) 50 MCG/ACT nasal spray Place 1 spray into both nostrils daily as needed for allergies. 16 g 3   HYDROcodone-acetaminophen (NORCO/VICODIN) 5-325 MG tablet Take 1 tablet by mouth every 6 (six) hours as needed for moderate pain. 90 tablet 0   ibuprofen (ADVIL) 200 MG tablet Take 400 mg by mouth at bedtime as needed.      levofloxacin (LEVAQUIN) 500 MG tablet  Take 1 tablet (500 mg total) by mouth daily. 7 tablet 0   nabumetone (RELAFEN) 750 MG tablet Take 1 tablet (750 mg total) by mouth 2 (two) times daily. 180 tablet 1   NON FORMULARY TUDCA 1 gm 2 per day     omeprazole (PRILOSEC) 20 MG capsule Take 1 capsule (20 mg total) by mouth daily. 90 capsule 3   riluzole (RILUTEK) 50 MG tablet Take 50 mg by mouth every 12 (twelve) hours.     senna-docusate (SENOKOT-S) 8.6-50 MG tablet Take 1 tablet by mouth daily. 30 tablet 0   simvastatin (ZOCOR) 20 MG tablet Take 1 tablet (20 mg total) by mouth at bedtime. 90 tablet 3   tamsulosin (FLOMAX) 0.4 MG CAPS capsule Take 0.4 mg by mouth daily.     No current facility-administered medications on file prior to visit.    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 Cornelius Moras   Gait abnormality 12/25/2018   Hypercholesterolemia    Low testosterone    Macular degeneration    Mobitz type  1 second degree atrioventricular block 04/04/2012   Obesity    PVC (premature ventricular contraction)    Sleep apnea, obstructive    on BiPAP    Past Surgical History:  Procedure Laterality Date   COLONOSCOPY     CORONARY ARTERY BYPASS GRAFT  Oct 2012   LIMA to LAD and left radial to OM   HEMORRHOIDECTOMY WITH HEMORRHOID BANDING     INJECTION KNEE      Social History   Tobacco Use  Smoking Status Former Smoker   Packs/day: 1.00   Years: 20.00   Pack years: 20.00   Types: Cigarettes   Quit date: 03/28/1980   Years since quitting: 40.0  Smokeless Tobacco Never Used    Social History   Substance and Sexual Activity  Alcohol Use Yes   Alcohol/week: 28.0 standard drinks   Types: 28 Cans of beer per week   Comment: 4 beers a night    Family History   Problem Relation Age of Onset   Heart disease Mother    Stroke Father    Heart disease Father    Dementia Father     Review of Systems: The review of systems is per the HPI.  All other systems were reviewed and are negative.  Physical Exam: There were no vitals taken for this visit. GENERAL:  Well appearing WM in NAD HEENT:  PERRL, EOMI, sclera are clear. Oropharynx is clear. NECK:  No jugular venous distention, carotid upstroke brisk and symmetric, no bruits, no thyromegaly or adenopathy LUNGS:  Clear to auscultation bilaterally CHEST:  Unremarkable HEART:  RRR,  PMI not displaced or sustained,S1 and S2 within normal limits, no S3, no S4: no clicks, no rubs, no murmurs ABD:  Soft, nontender. BS +, no masses or bruits. No hepatomegaly, no splenomegaly EXT:  2 + pulses throughout, no edema, no cyanosis no clubbing SKIN:  Warm and dry.  No rashes NEURO:  Alert and oriented x 3. Cranial nerves II through XII intact. PSYCH:  Cognitively intact   LABORATORY DATA: Lab Results  Component Value Date   WBC 11.4 (H) 01/15/2020   HGB 8.5 (L) 01/15/2020   HCT 26.4 (L) 01/15/2020   PLT 222 01/15/2020   GLUCOSE 105 (H) 01/15/2020   CHOL 171 10/12/2019   TRIG 72 10/12/2019   HDL 73 10/12/2019   LDLCALC 84 10/12/2019   ALT 16 01/13/2020   AST 14 (L) 01/13/2020   NA 133 (L) 01/15/2020   K 3.5 01/15/2020   CL 101 01/15/2020   CREATININE 0.47 (L) 01/15/2020   BUN 8 01/15/2020   CO2 22 01/15/2020   TSH 1.880 10/12/2019   INR 1.2 01/13/2020   HGBA1C 5.2 03/22/2011   Ecg today shows NSR with rate 88, sinus arrhythmia.  Septal infarct age undetermined. I have personally reviewed and interpreted this study.  Myoview 08/02/16: Study Highlights     Nuclear stress EF: 48%.  The left ventricular ejection fraction is mildly decreased (45-54%).  Blood pressure demonstrated a blunted response to exercise.  No T wave inversion was noted during stress.  Horizontal ST segment  depression ST segment depression of 1 mm was noted during stress in the II, III, aVF, V6, V5 and V4 leads.  Defect 1: There is a medium defect of moderate severity.  Findings consistent with prior myocardial infarction.  This is an intermediate risk study.   Moderate sized, medium intensity fixed anteroseptal, anteroapical, apical and inferoapical perfusion defect likely scar. No significant reversible ischemia. LVEF 48%  with distal anteroapical and apical severe hypokinesis to akinesis. This is an intermediate risk study.    Assessment / Plan: 1. Coronary disease status post CABG in October 2012. Stress Myoview in March 2018 without ischemia. 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  He has no anginal symptoms. Continue  current medication.   2. History of Mobitz type 1 second degree AV block. Resolved with stopping metoprolol.   3. Hyperlipidemia. Excellent control on Zocor.   I will follow up in one year.

## 2020-04-05 NOTE — Telephone Encounter (Signed)
Copied from CRM [id number]. Topic: General - Other >> Apr 05, 2020  2:52 PM Jaquita Rector A wrote: Reason for CRM: Patient wife called in to inquire of Dr Sullivan Lone about medication given for the UTI. Want to know if this will keep him from getting another UTI or is there a maintenance medication he can take. Please advise  Asking if Dr Sullivan Lone can also call patient when he have some free time.  Ph# [phone]

## 2020-04-07 ENCOUNTER — Encounter: Payer: Medicare Other | Admitting: Cardiology

## 2020-04-12 ENCOUNTER — Other Ambulatory Visit: Payer: Self-pay | Admitting: Family Medicine

## 2020-04-12 DIAGNOSIS — K219 Gastro-esophageal reflux disease without esophagitis: Secondary | ICD-10-CM

## 2020-04-12 MED ORDER — OMEPRAZOLE 20 MG PO CPDR: 20.0000 mg | DELAYED_RELEASE_CAPSULE | Freq: Every day | ORAL | 3 refills | Status: AC

## 2020-04-12 MED ORDER — HYDROCODONE-ACETAMINOPHEN 5-325 MG PO TABS
1.0000 | ORAL_TABLET | Freq: Four times a day (QID) | ORAL | 0 refills | Status: AC | PRN
Start: 2020-04-12 — End: ?

## 2020-04-12 MED ORDER — CLONAZEPAM 1 MG PO TABS: 1.0000 mg | ORAL_TABLET | Freq: Two times a day (BID) | ORAL | 5 refills | Status: AC | PRN

## 2020-04-12 NOTE — Progress Notes (Signed)
End-stage ALS under hospice care.  Patient request refill

## 2020-04-13 ENCOUNTER — Telehealth: Payer: Self-pay

## 2020-04-13 NOTE — Telephone Encounter (Signed)
Copied from CRM [id number]. Topic: General - Other >> Apr 13, 2020 10:49 AM Marylen Ponto wrote: Reason for CRM: Maura with [facility] stated patient's caregiver reports signs of continued infection so Cipro was ordered for 7 days. Cb# [phone]

## 2020-04-13 NOTE — Telephone Encounter (Signed)
FYI. Thanks.

## 2020-04-15 ENCOUNTER — Encounter: Payer: Self-pay | Admitting: Family Medicine

## 2020-04-18 ENCOUNTER — Encounter: Payer: Self-pay | Admitting: Neurology

## 2020-04-18 ENCOUNTER — Encounter: Payer: Self-pay | Admitting: Physician Assistant

## 2020-04-18 ENCOUNTER — Telehealth (INDEPENDENT_AMBULATORY_CARE_PROVIDER_SITE_OTHER): Admitting: Neurology

## 2020-04-18 ENCOUNTER — Telehealth (INDEPENDENT_AMBULATORY_CARE_PROVIDER_SITE_OTHER): Payer: Medicare Other | Admitting: Physician Assistant

## 2020-04-18 ENCOUNTER — Telehealth: Payer: Self-pay | Admitting: Neurology

## 2020-04-18 ENCOUNTER — Other Ambulatory Visit: Payer: Self-pay

## 2020-04-18 VITALS — BP 117/70 | HR 80 | Ht 73.0 in | Wt 175.0 lb

## 2020-04-18 DIAGNOSIS — I25810 Atherosclerosis of coronary artery bypass graft(s) without angina pectoris: Secondary | ICD-10-CM

## 2020-04-18 DIAGNOSIS — G1221 Amyotrophic lateral sclerosis: Secondary | ICD-10-CM | POA: Diagnosis not present

## 2020-04-18 DIAGNOSIS — E785 Hyperlipidemia, unspecified: Secondary | ICD-10-CM

## 2020-04-18 NOTE — Progress Notes (Signed)
Virtual Visit via Telephone Note   This visit type was conducted due to national recommendations for restrictions regarding the COVID-19 Pandemic (e.g. social distancing) in an effort to limit this patient's exposure and mitigate transmission in our community.  Due to his co-morbid illnesses, this patient is at least at moderate risk for complications without adequate follow up.  This format is felt to be most appropriate for this patient at this time.  The patient did not have access to video technology/had technical difficulties with video requiring transitioning to audio format only (telephone).  All issues noted in this document were discussed and addressed.  No physical exam could be performed with this format.  Please refer to the patient's chart for his  consent to telehealth for [facility].    Date:  04/20/2020   ID:  [name], DOB 1945-06-19, MRN [id number] The patient was identified using 2 identifiers.  Patient Location: Home Provider Location: Office/Clinic  PCP:  [name]., MD  Cardiologist:  Peter Swaziland, MD  Electrophysiologist:  None   Evaluation Performed:  Follow-Up Visit  Chief Complaint:  Follow up  History of Present Illness:    Jason Tate is a 74 y.o. 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 He also had a history of second-degree AV block associated with beta-blocker. Myoview in March 2018 showed evidence of previous infarct without ischemia, EF 48%. Patient was last seen by Dr. Swaziland via virtual visit in November 2020 at which time he was doing well.  Patient is currently enrolled in hospice due to ALS.  He is bedbound.  He denies any recent chest pain or significant shortness of breath.  He did complete a course of antibiotic for UTI.  He was previously admitted for sepsis and UTI in August.  The only current medication he is currently on is the aspirin and Zocor.  He is on Zocor 20 mg  daily.  Previous lab work in May 2021 showed borderline controlled LDL.  I did not try to increase the Zocor dose given his comorbidities of ALS and the bedbound status.  The patient does not have symptoms concerning for COVID-19 infection (fever, chills, cough, or new shortness of breath).    Past Medical History:  Diagnosis Date  . Abnormal stress echo Oct 2012  . 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 Arthritis   . CAD (coronary artery disease) October 2012   s/p CABG x 2 per Dr. Cornelius Moras  . Gait abnormality 12/25/2018  . Hypercholesterolemia   . Low testosterone   . Macular degeneration   . 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 Obesity   . PVC (premature ventricular contraction)   . Sleep apnea, obstructive    on BiPAP   Past Surgical History:  Procedure Laterality Date  . COLONOSCOPY    . CORONARY ARTERY BYPASS GRAFT  Oct 2012   LIMA to LAD and left radial to OM  . HEMORRHOIDECTOMY WITH HEMORRHOID BANDING    . INJECTION KNEE       Current Meds  Medication Sig  . allopurinol (ZYLOPRIM) 100 MG tablet Take 1 tablet (100 mg total) by mouth daily.  Marland Kitchen ALPRAZolam (XANAX) 1 MG tablet Take 1 tablet (1 mg total) by mouth 3 (three) times daily as needed for anxiety.  Marland Kitchen aspirin 81 MG tablet Take 81 mg by mouth daily.  Marland Kitchen buPROPion (WELLBUTRIN XL) 300  MG 24 hr tablet Take 1 tablet (300 mg total) by mouth daily.  . Cetirizine HCl (ZYRTEC ALLERGY PO) Take 1 tablet by mouth daily as needed (allergies).   . citalopram (CELEXA) 20 MG tablet Take 1 tablet (20 mg total) by mouth daily.  . clonazePAM (KLONOPIN) 1 MG tablet Take 1 tablet (1 mg total) by mouth 2 (two) times daily as needed for anxiety.  . Copper 5 MG TABS Take 5 mg by mouth daily.  . fluticasone (FLONASE) 50 MCG/ACT nasal spray Place 1 spray into both nostrils daily as needed for allergies.  Marland Kitchen HYDROcodone-acetaminophen (NORCO/VICODIN) 5-325 MG tablet Take 1 tablet by mouth every 6 (six)  hours as needed for moderate pain.  Marland Kitchen ibuprofen (ADVIL) 200 MG tablet Take 400 mg by mouth at bedtime as needed.   Marland Kitchen levofloxacin (LEVAQUIN) 500 MG tablet Take 1 tablet (500 mg total) by mouth daily.  . nabumetone (RELAFEN) 750 MG tablet Take 1 tablet (750 mg total) by mouth 2 (two) times daily. (Patient taking differently: Take 750 mg by mouth daily. )  . NON FORMULARY TUDCA 1 gm 2 per day  . omeprazole (PRILOSEC) 20 MG capsule Take 1 capsule (20 mg total) by mouth daily.  . riluzole (RILUTEK) 50 MG tablet Take 50 mg by mouth every 12 (twelve) hours.  . senna-docusate (SENOKOT-S) 8.6-50 MG tablet Take 1 tablet by mouth daily.  . simvastatin (ZOCOR) 20 MG tablet Take 1 tablet (20 mg total) by mouth at bedtime.  . tamsulosin (FLOMAX) 0.4 MG CAPS capsule Take 0.4 mg by mouth daily.     Allergies:   Penicillins   Social History   Tobacco Use  . Smoking status: Former Smoker    Packs/day: 1.00    Years: 20.00    Pack years: 20.00    Types: Cigarettes    Quit date: 03/28/1980    Years since quitting: 40.0  . Smokeless tobacco: Never Used  Vaping Use  . Vaping Use: Never used  Substance Use Topics  . Alcohol use: Yes    Alcohol/week: 28.0 standard drinks    Types: 28 Cans of beer per week    Comment: 4 beers a night  . Drug use: No     Family Hx: The patient's family history includes Dementia in his father; Heart disease in his father and mother; Stroke in his father.  ROS:   Please see the history of present illness.     All other systems reviewed and are negative.   Prior CV studies:   The following studies were reviewed today:  Myoview 07/31/2016  Nuclear stress EF: 48%.  The left ventricular ejection fraction is mildly decreased (45-54%).  Blood pressure demonstrated a blunted response to exercise.  No T wave inversion was noted during stress.  Horizontal ST segment depression ST segment depression of 1 mm was noted during stress in the II, III, aVF, V6, V5 and V4  leads.  Defect 1: There is a medium defect of moderate severity.  Findings consistent with prior myocardial infarction.  This is an intermediate risk study.   Moderate sized, medium intensity fixed anteroseptal, anteroapical, apical and inferoapical perfusion defect likely scar. No significant reversible ischemia. LVEF 48% with distal anteroapical and apical severe hypokinesis to akinesis. This is an intermediate risk study.    Labs/Other Tests and Data Reviewed:    EKG:  An ECG dated 12/16/2019 was personally reviewed today and demonstrated:  Sinus rhythm with poor R wave progression in the anterior leads.  Recent Labs: 10/12/2019: TSH 1.880 01/13/2020: ALT 16 01/15/2020: BUN 8; Creatinine, Ser 0.47; Hemoglobin 8.5; Magnesium 1.7; Platelets 222; Potassium 3.5; Sodium 133   Recent Lipid Panel Lab Results  Component Value Date/Time   CHOL 171 10/12/2019 11:05 AM   TRIG 72 10/12/2019 11:05 AM   HDL 73 10/12/2019 11:05 AM   CHOLHDL 2.3 10/12/2019 11:05 AM   CHOLHDL 2.8 08/01/2016 01:06 PM   LDLCALC 84 10/12/2019 11:05 AM    Wt Readings from Last 3 Encounters:  04/18/20 175 lb (79.4 kg)  01/14/20 179 lb 14.3 oz (81.6 kg)  12/16/19 185 lb (83.9 kg)     Risk Assessment/Calculations:      Objective:    Vital Signs:  BP 117/70   Pulse 80   Ht 6\' 1"  (1.854 m)   Wt 175 lb (79.4 kg)   BMI 23.09 kg/m    VITAL SIGNS:  reviewed  ASSESSMENT & PLAN:    1. CAD: Denies any recent chest pain.  Last Myoview in 2018 was low risk.  2. Hyperlipidemia: Continue Zocor.  Previous lab work obtained earlier this year showed borderline controlled LDL, I did not try to increase the Zocor given hospice status.  3. ALS: Bedbound.  Currently in hospice.   Shared Decision Making/Informed Consent        COVID-19 Education: The signs and symptoms of COVID-19 were discussed with the patient and how to seek care for testing (follow up with PCP or arrange E-visit).  The importance of social  distancing was discussed today.  Time:   Today, I have spent 8 minutes with the patient with telehealth technology discussing the above problems.     Medication Adjustments/Labs and Tests Ordered: Current medicines are reviewed at length with the patient today.  Concerns regarding medicines are outlined above.   Tests Ordered: No orders of the defined types were placed in this encounter.   Medication Changes: No orders of the defined types were placed in this encounter.   Follow Up:  In Person in 1 year(s)  Signed, 2019, Azalee Course  04/20/2020 11:00 PM    [facility]

## 2020-04-18 NOTE — Patient Instructions (Signed)
Medication Instructions:  Continue current medications  *If you need a refill on your cardiac medications before your next appointment, please call your pharmacy*   Lab Work: None Ordered   Testing/Procedures: None Ordered   Follow-Up: At [facility], you and your health needs are our priority.  As part of our continuing mission to provide you with exceptional heart care, we have created designated Provider Care Teams.  These Care Teams include your primary Cardiologist (physician) and Advanced Practice Providers (APPs -  Physician Assistants and Nurse Practitioners) who all work together to provide you with the care you need, when you need it.  We recommend signing up for the patient portal called "MyChart".  Sign up information is provided on this After Visit Summary.  MyChart is used to connect with patients for Virtual Visits (Telemedicine).  Patients are able to view lab/test results, encounter notes, upcoming appointments, etc.  Non-urgent messages can be sent to your provider as well.   To learn more about what you can do with MyChart, go to [email or web address].    Your next appointment:   1 year(s)  The format for your next appointment:   In Person  Provider:   You may see Peter Jordan, MD or one of the following Advanced Practice Providers on your designated Care Team:    Hao Meng, PA-C  Angela Duke, PA-C or   Krista Kroeger, PA-C      

## 2020-04-18 NOTE — Progress Notes (Signed)
     Virtual Visit via Telephone Note  I connected with Jason Tate on 04/18/20 at 11:30 AM EST by telephone and verified that I am speaking with Jason correct person using two identifiers.  Location: Patient: Jason patient is at home. Provider: Physician in office.   I discussed Jason limitations, risks, security and privacy concerns of performing an evaluation and management service by telephone and Jason availability of in person appointments. I also discussed with Jason patient that there may be a patient responsible charge related to this service. Jason patient expressed understanding and agreed to proceed.   History of Present Illness: Jason Tate is a 74 year old right-handed white male with a history of ALS.  Jason patient has had ongoing progression of his disease, he is quadriparetic, he requires assistance now to feed himself.  He is having some dysarthria but he denies any significant issues with dysphagia.  He takes small bites, he is careful with swallowing, he is not having a lot of problems with liquids.  He denies any significant issues with shortness of breath, he does use CPAP at night.  Jason patient is having some back and neck discomfort off and on, he has Vicodin to take if needed.  He does have some problems with redness of Jason skin on Jason sacral area, they are trying to get Jason weight off and use a barrier cream.  He has indwelling catheter for his bladder.  Hospice is now involved, Jason patient is pleased with their service.  He has had some issues previously with constipation.  He remains on Rilutek 50 mg twice daily.  He is followed through [facility] in Jason ALS clinic.   Observations/Objective: Jason interview was done on Jason telephone, Jason speech is somewhat dysarthric, not aphasic.  Jason patient is alert and cooperative.  Assessment and Plan: 1.  ALS  Jason patient has had ongoing progression of his disease, he has now quadriparetic.  He is not yet having significant issues with  breathing.  He is being followed through hospice.  We will continue supportive care, Jason patient will call me if any needs arise.  Follow Up Instructions: We will follow up in 6 months, sooner if needed.   I discussed Jason assessment and treatment plan with Jason patient. Jason patient was provided an opportunity to ask questions and all were answered. Jason patient agreed with Jason plan and demonstrated an understanding of Jason instructions.   Jason patient was advised to call back or seek an in-person evaluation if Jason symptoms worsen or if Jason condition fails to improve as anticipated.  I provided 25 minutes of non-face-to-face time during this encounter.   York Spaniel, MD

## 2020-04-18 NOTE — Telephone Encounter (Signed)
Pt.'s wife Pattricia Boss is on [place]. She is asking for a telephone visit for husband as he is sick & in the bed. Please advise.  Best contact: [phone]

## 2020-04-20 ENCOUNTER — Encounter: Payer: Self-pay | Admitting: Physician Assistant

## 2020-04-27 ENCOUNTER — Telehealth: Payer: Self-pay | Admitting: Family Medicine

## 2020-04-27 NOTE — Telephone Encounter (Signed)
Roney Mans  [phone] [facility]   Requesting a call back from the nurse regarding symptom management and medication changes. Did not disclose any further

## 2020-04-27 NOTE — Telephone Encounter (Signed)
LMOVM for Jason Tate to return call.

## 2020-04-27 NOTE — Telephone Encounter (Signed)
Ok--thanks. Was there anything we needed to do?

## 2020-04-27 NOTE — Telephone Encounter (Signed)
Sharman Crate was calling with an update with patient's care. Klonopin has been changed to every 8 hours prn. Also patient has been having increased congestion, which he was given zpak for treatment.

## 2020-04-28 NOTE — Telephone Encounter (Signed)
Just FYI.

## 2020-05-11 ENCOUNTER — Ambulatory Visit: Payer: Self-pay | Admitting: Family Medicine

## 2020-05-13 ENCOUNTER — Ambulatory Visit: Payer: Self-pay

## 2020-05-17 ENCOUNTER — Telehealth: Payer: Self-pay

## 2020-05-17 NOTE — Telephone Encounter (Signed)
Will be happy to sign in am

## 2020-05-17 NOTE — Telephone Encounter (Signed)
Copied from CRM [id number]. Topic: General - Other >> May 17, 2020  9:32 AM Jason Tate wrote: Reason for CRM: Jason Tate [employer] called to see if Dr. Sullivan Tate would sign death certificate / they stated they may mail it to the office if they cant drop it off and if it can be faxed back asap due to this being a cremation so they can start process / please advise

## 2020-05-28 NOTE — Telephone Encounter (Signed)
[facility]  Nurse called with urgent message for Dr Sullivan Lone. Patient is in last days of life. She Just wanted to let Dr Sullivan Lone know . Will rout to office  Reason for Disposition . [1] Caller requesting NON-URGENT health information AND [2] PCP's office is the best resource    Attempted to contact office Was unable to get answer Will route information high priority.  Answer Assessment - Initial Assessment Questions 1. REASON FOR CALL or QUESTION: "What is your reason for calling today?" or "How can I best help you?" or "What question do you have that I can help answer?"     Roney Mans RN with hospice  Is reaching out to inforn=m provider that patient is in his last few days of life.  Protocols used: INFORMATION ONLY CALL - NO TRIAGE-A-AH

## 2020-05-28 DEATH — deceased

## 2020-10-17 ENCOUNTER — Telehealth: Payer: Medicare Other | Admitting: Neurology

## 2021-01-23 IMAGING — DX DG CHEST 1V
2 series · 2 of 2 positions shown · non-contrast
Comparison: 10/28/2013 and overlapping portions of CT abdomen from
12/16/2019

CLINICAL DATA: Urinary tract infection.  Sepsis.

EXAM:
CHEST  1 VIEW

[chest ap (1 of 2)]
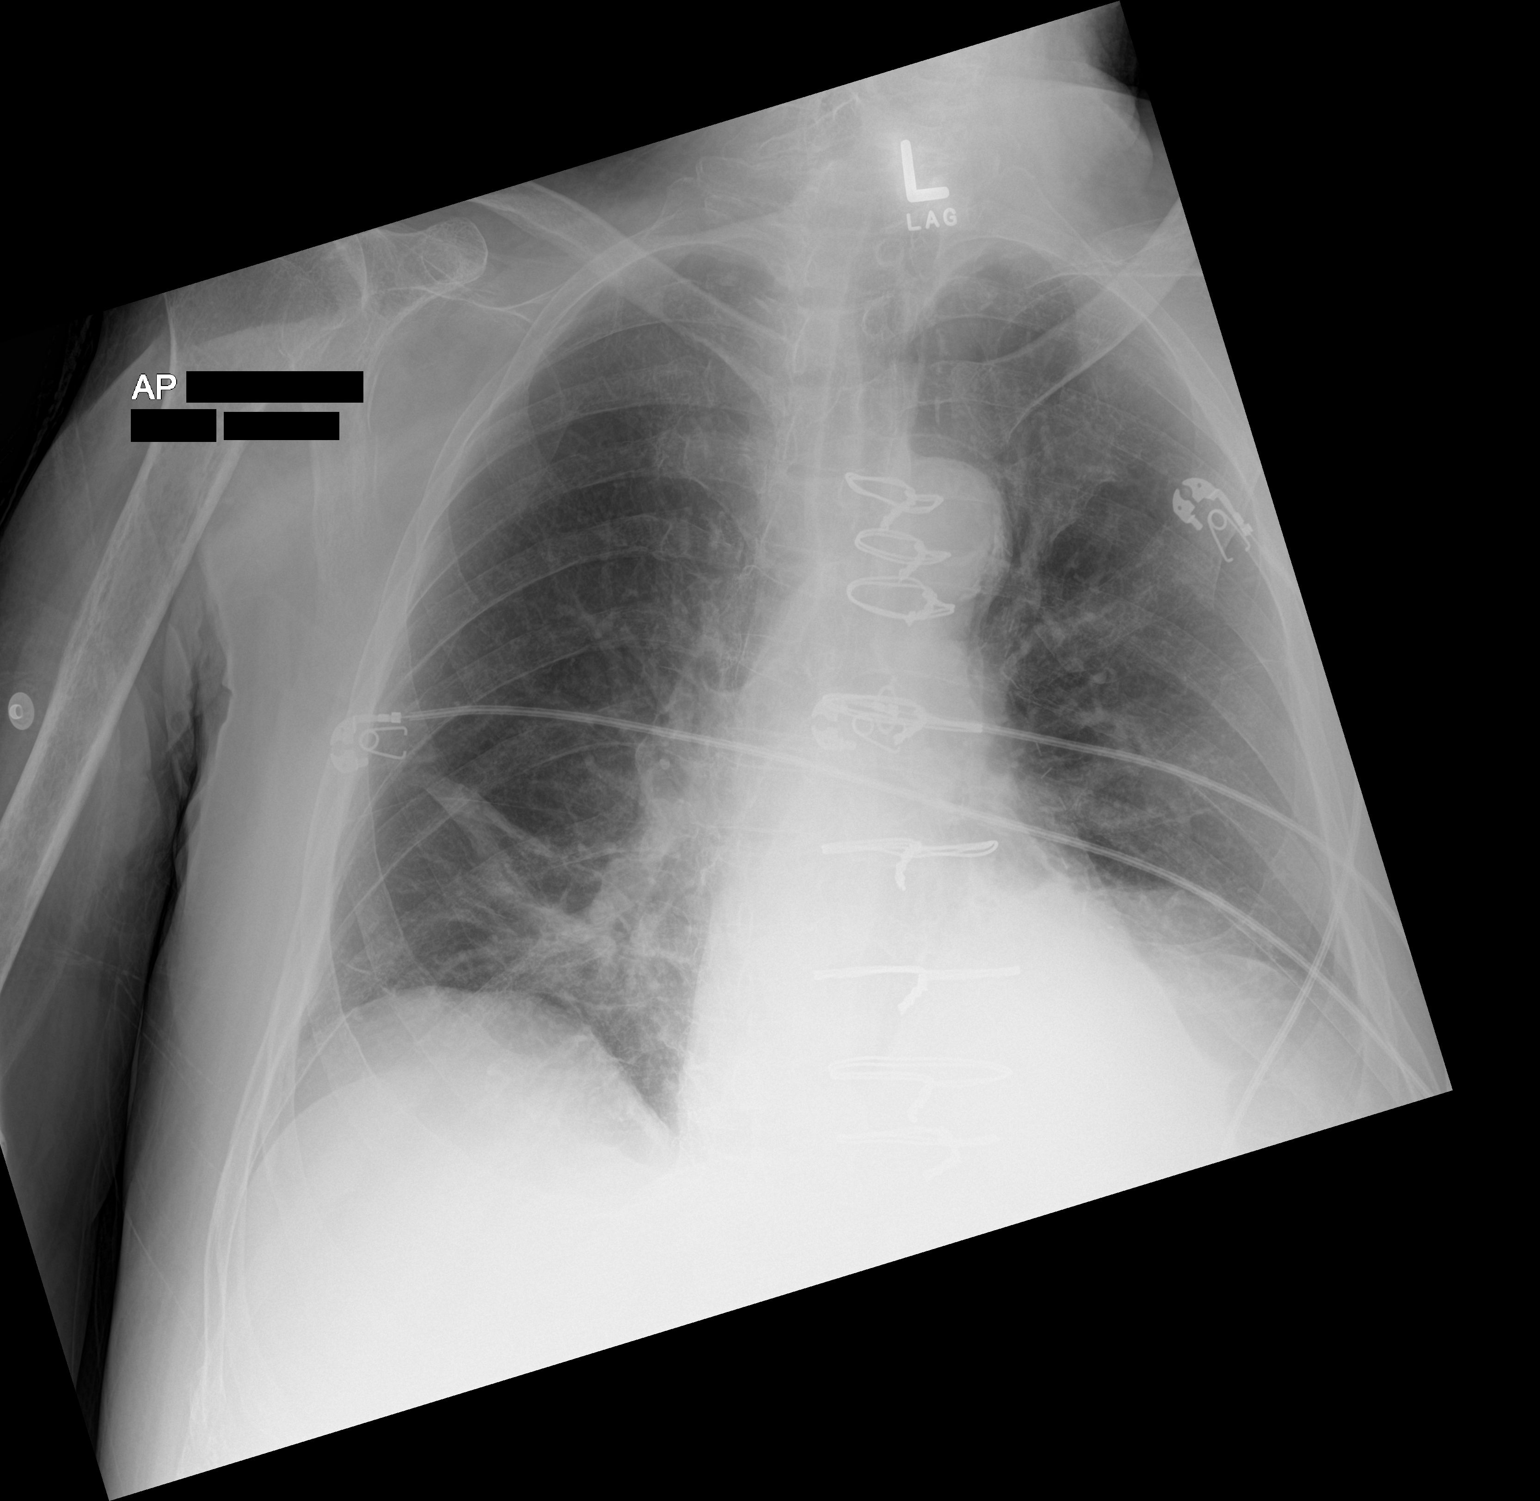

[chest ap (2 of 2)]
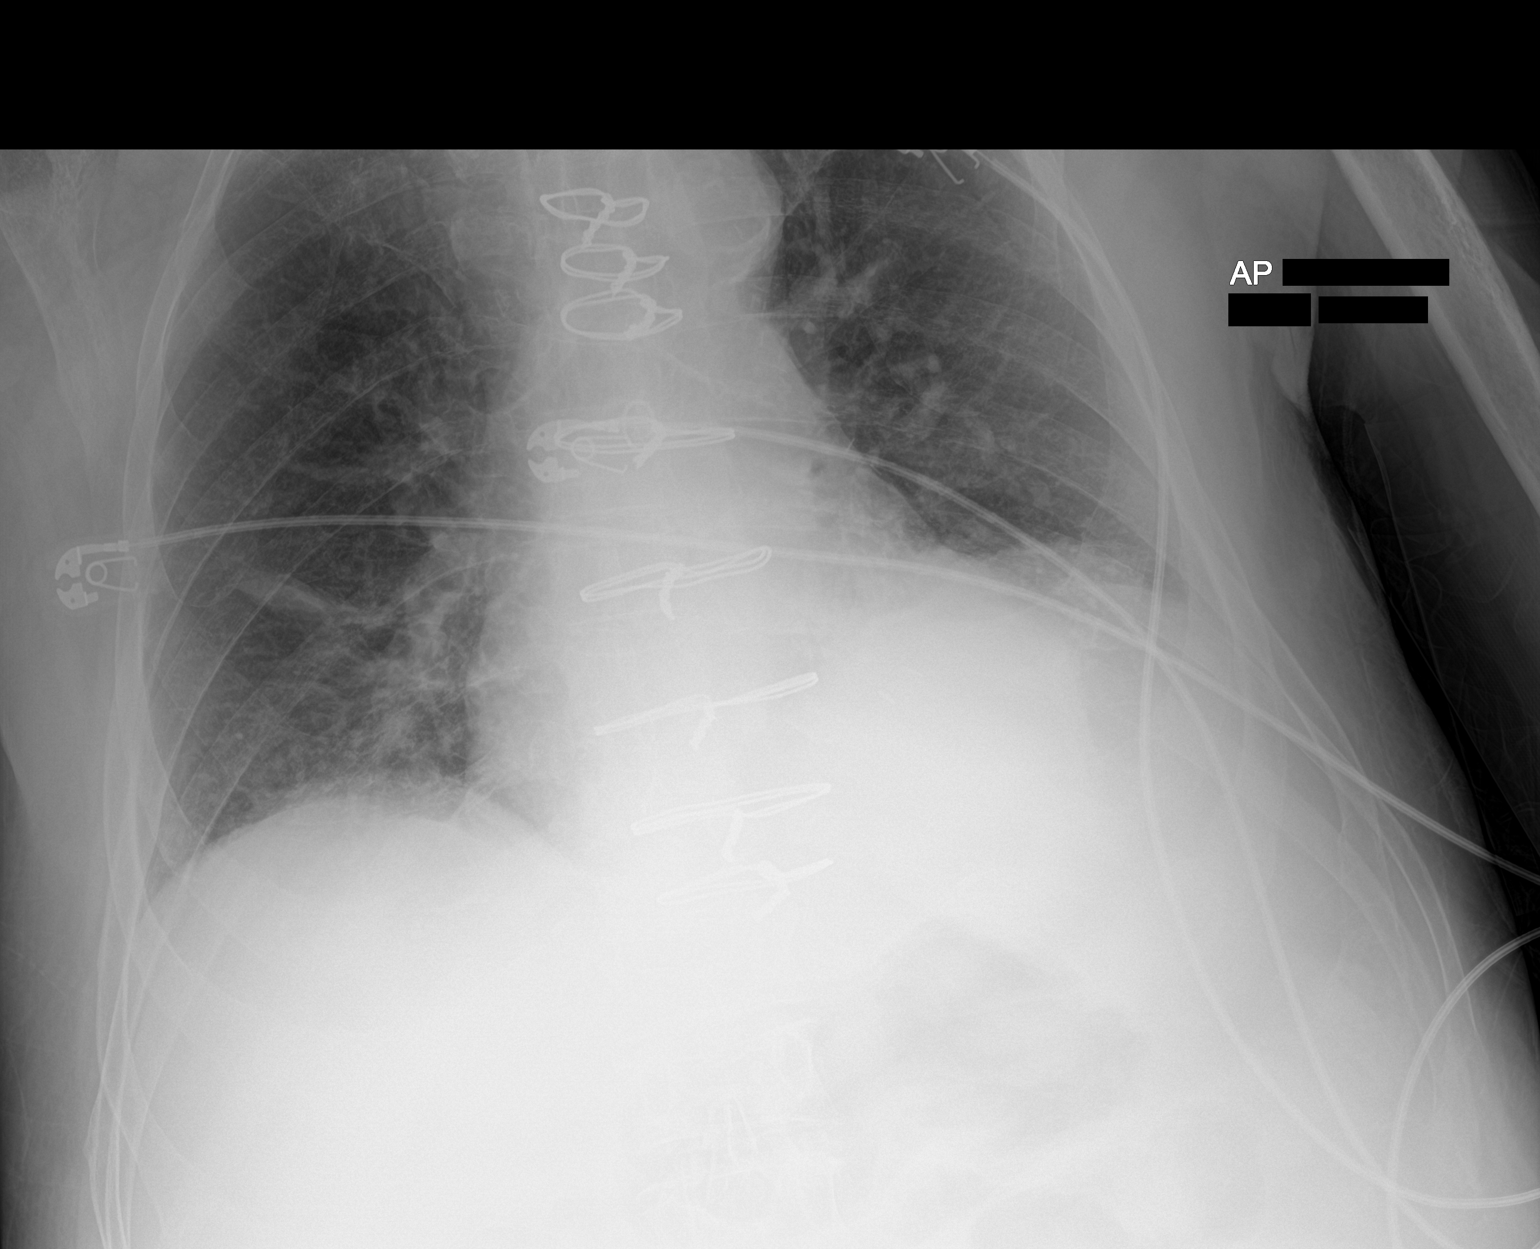

[2 of 2 positions shown; findings below may reference images not displayed]

FINDINGS: Stable linear subsegmental atelectasis or scarring in the lower
lobes. Atherosclerotic calcification of the aortic arch. Prior CABG.
No edema.
IMPRESSION: 1. Stable linear subsegmental atelectasis or scarring in the lower
lobes.
2. Prior CABG.
3. Atherosclerosis.
